# Patient Record
Sex: Female | Born: 1960 | Race: Black or African American | Hispanic: No | Smoking: Former smoker
Health system: Southern US, Community
[De-identification: ages and names within clinical notes are randomized; demographics above are authoritative.]

## PROBLEM LIST (undated history)

## (undated) DIAGNOSIS — K219 Gastro-esophageal reflux disease without esophagitis: Secondary | ICD-10-CM

## (undated) DIAGNOSIS — G629 Polyneuropathy, unspecified: Secondary | ICD-10-CM

## (undated) DIAGNOSIS — M1990 Unspecified osteoarthritis, unspecified site: Secondary | ICD-10-CM

## (undated) DIAGNOSIS — L509 Urticaria, unspecified: Secondary | ICD-10-CM

## (undated) DIAGNOSIS — Z8719 Personal history of other diseases of the digestive system: Secondary | ICD-10-CM

## (undated) DIAGNOSIS — F329 Major depressive disorder, single episode, unspecified: Secondary | ICD-10-CM

## (undated) DIAGNOSIS — F419 Anxiety disorder, unspecified: Secondary | ICD-10-CM

## (undated) DIAGNOSIS — J189 Pneumonia, unspecified organism: Secondary | ICD-10-CM

## (undated) DIAGNOSIS — J45909 Unspecified asthma, uncomplicated: Secondary | ICD-10-CM

## (undated) DIAGNOSIS — M5430 Sciatica, unspecified side: Secondary | ICD-10-CM

## (undated) DIAGNOSIS — R059 Cough, unspecified: Secondary | ICD-10-CM

## (undated) DIAGNOSIS — I2510 Atherosclerotic heart disease of native coronary artery without angina pectoris: Secondary | ICD-10-CM

## (undated) DIAGNOSIS — I219 Acute myocardial infarction, unspecified: Secondary | ICD-10-CM

## (undated) DIAGNOSIS — G4733 Obstructive sleep apnea (adult) (pediatric): Secondary | ICD-10-CM

## (undated) DIAGNOSIS — F32A Depression, unspecified: Secondary | ICD-10-CM

## (undated) DIAGNOSIS — R05 Cough: Secondary | ICD-10-CM

## (undated) HISTORY — DX: Personal history of other diseases of the digestive system: Z87.19

## (undated) HISTORY — DX: Cough: R05

## (undated) HISTORY — DX: Urticaria, unspecified: L50.9

## (undated) HISTORY — PX: PARTIAL COLECTOMY: SHX5273

## (undated) HISTORY — PX: NASAL RECONSTRUCTION: SHX2069

## (undated) HISTORY — PX: UVULOPALATOPHARYNGOPLASTY: SHX827

## (undated) HISTORY — PX: COLONOSCOPY: SHX174

## (undated) HISTORY — PX: TONSILLECTOMY AND ADENOIDECTOMY: SUR1326

## (undated) HISTORY — DX: Cough, unspecified: R05.9

## (undated) HISTORY — DX: Unspecified asthma, uncomplicated: J45.909

---

## 1999-06-19 ENCOUNTER — Emergency Department (HOSPITAL_COMMUNITY): Admission: EM | Admit: 1999-06-19 | Discharge: 1999-06-19 | Payer: Self-pay | Admitting: Emergency Medicine

## 2000-01-28 ENCOUNTER — Encounter: Payer: Self-pay | Admitting: Gastroenterology

## 2000-01-28 ENCOUNTER — Encounter: Admission: RE | Admit: 2000-01-28 | Discharge: 2000-01-28 | Payer: Self-pay | Admitting: Gastroenterology

## 2000-05-29 ENCOUNTER — Encounter: Admission: RE | Admit: 2000-05-29 | Discharge: 2000-05-29 | Payer: Self-pay | Admitting: Geriatric Medicine

## 2000-05-29 ENCOUNTER — Encounter: Payer: Self-pay | Admitting: Geriatric Medicine

## 2000-05-29 ENCOUNTER — Emergency Department (HOSPITAL_COMMUNITY): Admission: EM | Admit: 2000-05-29 | Discharge: 2000-05-29 | Payer: Self-pay | Admitting: Emergency Medicine

## 2000-06-02 ENCOUNTER — Encounter: Admission: RE | Admit: 2000-06-02 | Discharge: 2000-06-02 | Payer: Self-pay | Admitting: Geriatric Medicine

## 2000-06-02 ENCOUNTER — Encounter: Payer: Self-pay | Admitting: Geriatric Medicine

## 2000-06-16 ENCOUNTER — Encounter: Admission: RE | Admit: 2000-06-16 | Discharge: 2000-09-14 | Payer: Self-pay | Admitting: Geriatric Medicine

## 2000-09-16 ENCOUNTER — Encounter: Admission: RE | Admit: 2000-09-16 | Discharge: 2000-09-25 | Payer: Self-pay | Admitting: Geriatric Medicine

## 2000-10-10 ENCOUNTER — Emergency Department (HOSPITAL_COMMUNITY): Admission: EM | Admit: 2000-10-10 | Discharge: 2000-10-10 | Payer: Self-pay | Admitting: Emergency Medicine

## 2000-10-17 ENCOUNTER — Inpatient Hospital Stay (HOSPITAL_COMMUNITY): Admission: EM | Admit: 2000-10-17 | Discharge: 2000-10-19 | Payer: Self-pay | Admitting: Emergency Medicine

## 2000-11-15 ENCOUNTER — Encounter: Payer: Self-pay | Admitting: Emergency Medicine

## 2000-11-16 ENCOUNTER — Inpatient Hospital Stay (HOSPITAL_COMMUNITY): Admission: EM | Admit: 2000-11-16 | Discharge: 2000-11-25 | Payer: Self-pay | Admitting: Emergency Medicine

## 2000-11-21 ENCOUNTER — Encounter: Payer: Self-pay | Admitting: General Surgery

## 2000-11-23 ENCOUNTER — Encounter: Payer: Self-pay | Admitting: Geriatric Medicine

## 2000-12-01 ENCOUNTER — Inpatient Hospital Stay (HOSPITAL_COMMUNITY): Admission: EM | Admit: 2000-12-01 | Discharge: 2000-12-19 | Payer: Self-pay | Admitting: Emergency Medicine

## 2000-12-01 ENCOUNTER — Encounter (INDEPENDENT_AMBULATORY_CARE_PROVIDER_SITE_OTHER): Payer: Self-pay | Admitting: Specialist

## 2000-12-01 ENCOUNTER — Encounter: Payer: Self-pay | Admitting: Geriatric Medicine

## 2000-12-10 ENCOUNTER — Encounter: Payer: Self-pay | Admitting: Geriatric Medicine

## 2002-09-03 ENCOUNTER — Emergency Department (HOSPITAL_COMMUNITY): Admission: EM | Admit: 2002-09-03 | Discharge: 2002-09-03 | Payer: Self-pay | Admitting: Emergency Medicine

## 2003-06-14 ENCOUNTER — Encounter: Payer: Self-pay | Admitting: Geriatric Medicine

## 2003-06-14 ENCOUNTER — Encounter: Admission: RE | Admit: 2003-06-14 | Discharge: 2003-06-14 | Payer: Self-pay | Admitting: Geriatric Medicine

## 2003-07-19 ENCOUNTER — Ambulatory Visit (HOSPITAL_COMMUNITY): Admission: RE | Admit: 2003-07-19 | Discharge: 2003-07-19 | Payer: Self-pay | Admitting: Gastroenterology

## 2003-08-14 ENCOUNTER — Ambulatory Visit (HOSPITAL_BASED_OUTPATIENT_CLINIC_OR_DEPARTMENT_OTHER): Admission: RE | Admit: 2003-08-14 | Discharge: 2003-08-14 | Payer: Self-pay | Admitting: Otolaryngology

## 2003-10-11 ENCOUNTER — Encounter (INDEPENDENT_AMBULATORY_CARE_PROVIDER_SITE_OTHER): Payer: Self-pay | Admitting: *Deleted

## 2003-10-11 ENCOUNTER — Inpatient Hospital Stay (HOSPITAL_COMMUNITY): Admission: RE | Admit: 2003-10-11 | Discharge: 2003-10-12 | Payer: Self-pay | Admitting: Otolaryngology

## 2004-11-27 ENCOUNTER — Encounter: Admission: RE | Admit: 2004-11-27 | Discharge: 2004-11-27 | Payer: Self-pay | Admitting: Geriatric Medicine

## 2004-11-28 ENCOUNTER — Encounter: Admission: RE | Admit: 2004-11-28 | Discharge: 2004-11-28 | Payer: Self-pay | Admitting: Geriatric Medicine

## 2006-07-20 ENCOUNTER — Other Ambulatory Visit: Admission: RE | Admit: 2006-07-20 | Discharge: 2006-07-20 | Payer: Self-pay | Admitting: Geriatric Medicine

## 2006-08-27 ENCOUNTER — Emergency Department (HOSPITAL_COMMUNITY): Admission: EM | Admit: 2006-08-27 | Discharge: 2006-08-27 | Payer: Self-pay | Admitting: Emergency Medicine

## 2006-09-16 ENCOUNTER — Ambulatory Visit: Payer: Self-pay | Admitting: Family Medicine

## 2007-04-27 ENCOUNTER — Ambulatory Visit: Payer: Self-pay | Admitting: *Deleted

## 2007-04-27 ENCOUNTER — Ambulatory Visit: Payer: Self-pay | Admitting: Family Medicine

## 2007-04-28 ENCOUNTER — Ambulatory Visit (HOSPITAL_COMMUNITY): Admission: RE | Admit: 2007-04-28 | Discharge: 2007-04-28 | Payer: Self-pay | Admitting: Family Medicine

## 2007-05-06 ENCOUNTER — Encounter: Admission: RE | Admit: 2007-05-06 | Discharge: 2007-05-06 | Payer: Self-pay | Admitting: Family Medicine

## 2007-05-12 ENCOUNTER — Ambulatory Visit: Payer: Self-pay | Admitting: Family Medicine

## 2007-05-19 ENCOUNTER — Ambulatory Visit: Payer: Self-pay | Admitting: Family Medicine

## 2007-08-30 ENCOUNTER — Ambulatory Visit: Payer: Self-pay | Admitting: Family Medicine

## 2007-09-01 ENCOUNTER — Ambulatory Visit: Payer: Self-pay | Admitting: Family Medicine

## 2007-10-31 ENCOUNTER — Emergency Department (HOSPITAL_COMMUNITY): Admission: EM | Admit: 2007-10-31 | Discharge: 2007-10-31 | Payer: Self-pay | Admitting: Emergency Medicine

## 2008-01-10 ENCOUNTER — Ambulatory Visit: Payer: Self-pay | Admitting: Family Medicine

## 2008-03-06 ENCOUNTER — Ambulatory Visit: Payer: Self-pay | Admitting: Internal Medicine

## 2008-06-18 ENCOUNTER — Emergency Department (HOSPITAL_COMMUNITY): Admission: EM | Admit: 2008-06-18 | Discharge: 2008-06-18 | Payer: Self-pay | Admitting: Emergency Medicine

## 2008-07-09 ENCOUNTER — Emergency Department (HOSPITAL_COMMUNITY): Admission: EM | Admit: 2008-07-09 | Discharge: 2008-07-09 | Payer: Self-pay | Admitting: *Deleted

## 2008-07-31 ENCOUNTER — Ambulatory Visit: Payer: Self-pay | Admitting: Family Medicine

## 2008-11-06 ENCOUNTER — Ambulatory Visit: Payer: Self-pay | Admitting: Family Medicine

## 2008-11-13 ENCOUNTER — Ambulatory Visit: Payer: Self-pay | Admitting: Family Medicine

## 2008-11-14 ENCOUNTER — Ambulatory Visit: Payer: Self-pay | Admitting: Internal Medicine

## 2009-04-26 ENCOUNTER — Ambulatory Visit: Payer: Self-pay | Admitting: Internal Medicine

## 2009-05-14 ENCOUNTER — Ambulatory Visit: Payer: Self-pay | Admitting: Internal Medicine

## 2009-05-16 ENCOUNTER — Ambulatory Visit: Payer: Self-pay | Admitting: Internal Medicine

## 2009-06-26 ENCOUNTER — Ambulatory Visit: Payer: Self-pay | Admitting: Family Medicine

## 2009-06-26 ENCOUNTER — Encounter (INDEPENDENT_AMBULATORY_CARE_PROVIDER_SITE_OTHER): Payer: Self-pay | Admitting: Family Medicine

## 2009-06-26 ENCOUNTER — Telehealth (INDEPENDENT_AMBULATORY_CARE_PROVIDER_SITE_OTHER): Payer: Self-pay | Admitting: *Deleted

## 2009-06-26 LAB — CONVERTED CEMR LAB
ALT: 44 units/L — ABNORMAL HIGH (ref 0–35)
AST: 22 units/L (ref 0–37)
Basophils Absolute: 0 10*3/uL (ref 0.0–0.1)
Calcium: 9.4 mg/dL (ref 8.4–10.5)
Cholesterol: 221 mg/dL — ABNORMAL HIGH (ref 0–200)
Eosinophils Absolute: 0 10*3/uL (ref 0.0–0.7)
HDL: 59 mg/dL (ref >39)
Hemoglobin: 14.3 g/dL (ref 12.0–15.0)
Lymphocytes Relative: 27 % (ref 12–46)
Lymphs Abs: 1.7 10*3/uL (ref 0.7–4.0)
MCHC: 32.5 g/dL (ref 30.0–36.0)
Monocytes Relative: 11 % (ref 3–12)
Neutro Abs: 4 10*3/uL (ref 1.7–7.7)
Neutrophils Relative %: 61 % (ref 43–77)
Platelets: 232 10*3/uL (ref 150–400)
Total Bilirubin: 0.6 mg/dL (ref 0.3–1.2)
Total CHOL/HDL Ratio: 3.7
Total Protein: 7.3 g/dL (ref 6.0–8.3)
Triglycerides: 222 mg/dL — ABNORMAL HIGH (ref <150)

## 2009-07-03 ENCOUNTER — Telehealth (INDEPENDENT_AMBULATORY_CARE_PROVIDER_SITE_OTHER): Payer: Self-pay | Admitting: *Deleted

## 2009-08-27 DIAGNOSIS — I219 Acute myocardial infarction, unspecified: Secondary | ICD-10-CM

## 2009-08-27 HISTORY — DX: Acute myocardial infarction, unspecified: I21.9

## 2009-08-31 ENCOUNTER — Inpatient Hospital Stay (HOSPITAL_COMMUNITY): Admission: EM | Admit: 2009-08-31 | Discharge: 2009-09-06 | Payer: Self-pay | Admitting: Emergency Medicine

## 2009-09-03 ENCOUNTER — Encounter (INDEPENDENT_AMBULATORY_CARE_PROVIDER_SITE_OTHER): Payer: Self-pay | Admitting: Family Medicine

## 2009-09-10 ENCOUNTER — Encounter: Admission: RE | Admit: 2009-09-10 | Discharge: 2009-10-24 | Payer: Self-pay | Admitting: Family Medicine

## 2009-09-27 ENCOUNTER — Encounter (INDEPENDENT_AMBULATORY_CARE_PROVIDER_SITE_OTHER): Payer: Self-pay | Admitting: Internal Medicine

## 2009-09-27 ENCOUNTER — Ambulatory Visit: Payer: Self-pay | Admitting: Family Medicine

## 2009-12-10 ENCOUNTER — Ambulatory Visit: Payer: Self-pay | Admitting: Internal Medicine

## 2009-12-13 ENCOUNTER — Ambulatory Visit: Payer: Self-pay | Admitting: Family Medicine

## 2010-01-25 ENCOUNTER — Ambulatory Visit: Payer: Self-pay | Admitting: Family Medicine

## 2010-02-08 ENCOUNTER — Ambulatory Visit (HOSPITAL_COMMUNITY): Admission: RE | Admit: 2010-02-08 | Discharge: 2010-02-08 | Payer: Self-pay | Admitting: Family Medicine

## 2010-02-20 ENCOUNTER — Ambulatory Visit: Payer: Self-pay | Admitting: Family Medicine

## 2010-05-10 ENCOUNTER — Ambulatory Visit: Payer: Self-pay | Admitting: Internal Medicine

## 2010-12-30 ENCOUNTER — Inpatient Hospital Stay (INDEPENDENT_AMBULATORY_CARE_PROVIDER_SITE_OTHER)
Admission: RE | Admit: 2010-12-30 | Discharge: 2010-12-30 | Disposition: A | Discharge status: Home / Self Care | Payer: Self-pay | Source: Ambulatory Visit | Attending: Family Medicine | Admitting: Family Medicine

## 2010-12-30 DIAGNOSIS — J069 Acute upper respiratory infection, unspecified: Secondary | ICD-10-CM

## 2011-01-29 LAB — BASIC METABOLIC PANEL
BUN: 10 mg/dL (ref 6–23)
CO2: 32 mEq/L (ref 19–32)
GFR calc non Af Amer: >60 mL/min (ref >60)
Glucose, Bld: 167 mg/dL — ABNORMAL HIGH (ref 70–99)
Potassium: 5.6 mEq/L — ABNORMAL HIGH (ref 3.5–5.1)
Sodium: 138 mEq/L (ref 135–145)

## 2011-01-29 LAB — GLUCOSE, CAPILLARY
Glucose-Capillary: 149 mg/dL — ABNORMAL HIGH (ref 70–99)
Glucose-Capillary: 156 mg/dL — ABNORMAL HIGH (ref 70–99)
Glucose-Capillary: 163 mg/dL — ABNORMAL HIGH (ref 70–99)
Glucose-Capillary: 176 mg/dL — ABNORMAL HIGH (ref 70–99)
Glucose-Capillary: 176 mg/dL — ABNORMAL HIGH (ref 70–99)
Glucose-Capillary: 178 mg/dL — ABNORMAL HIGH (ref 70–99)
Glucose-Capillary: 186 mg/dL — ABNORMAL HIGH (ref 70–99)
Glucose-Capillary: 203 mg/dL — ABNORMAL HIGH (ref 70–99)
Glucose-Capillary: 233 mg/dL — ABNORMAL HIGH (ref 70–99)
Glucose-Capillary: 238 mg/dL — ABNORMAL HIGH (ref 70–99)
Glucose-Capillary: 248 mg/dL — ABNORMAL HIGH (ref 70–99)

## 2011-01-29 LAB — POCT CARDIAC MARKERS
CKMB, poc: <1 ng/mL — ABNORMAL LOW (ref 1.0–8.0)
CKMB, poc: <1 ng/mL — ABNORMAL LOW (ref 1.0–8.0)
Myoglobin, poc: 14.4 ng/mL (ref 12–200)
Myoglobin, poc: 72.7 ng/mL (ref 12–200)
Troponin i, poc: <0.05 ng/mL (ref 0.00–0.09)

## 2011-01-29 LAB — CK TOTAL AND CKMB (NOT AT ARMC)
CK, MB: 0.5 ng/mL (ref 0.3–4.0)
Relative Index: INVALID (ref 0.0–2.5)

## 2011-01-29 LAB — URINALYSIS, ROUTINE W REFLEX MICROSCOPIC
Glucose, UA: >1000 mg/dL — AB
Hgb urine dipstick: NEGATIVE
Ketones, ur: NEGATIVE mg/dL
Protein, ur: NEGATIVE mg/dL

## 2011-01-29 LAB — CARDIAC PANEL(CRET KIN+CKTOT+MB+TROPI)
CK, MB: 0.9 ng/mL (ref 0.3–4.0)
CK, MB: 0.9 ng/mL (ref 0.3–4.0)
CK, MB: 1 ng/mL (ref 0.3–4.0)
Relative Index: INVALID (ref 0.0–2.5)
Relative Index: INVALID (ref 0.0–2.5)
Total CK: 48 U/L (ref 7–177)
Total CK: 74 U/L (ref 7–177)
Troponin I: 0.03 ng/mL (ref 0.00–0.06)
Troponin I: <0.01 ng/mL (ref 0.00–0.06)

## 2011-01-29 LAB — CBC
HCT: 36.6 % (ref 36.0–46.0)
Hemoglobin: 12.8 g/dL (ref 12.0–15.0)
MCHC: 34.9 g/dL (ref 30.0–36.0)
MCV: 87.8 fL (ref 78.0–100.0)
Platelets: 220 10*3/uL (ref 150–400)
Platelets: 221 10*3/uL (ref 150–400)
RDW: 12.6 % (ref 11.5–15.5)
RDW: 13.2 % (ref 11.5–15.5)

## 2011-01-29 LAB — DIFFERENTIAL
Basophils Absolute: 0 10*3/uL (ref 0.0–0.1)
Basophils Absolute: 0 10*3/uL (ref 0.0–0.1)
Basophils Relative: 0 % (ref 0–1)
Basophils Relative: 0 % (ref 0–1)
Eosinophils Absolute: 0 10*3/uL (ref 0.0–0.7)
Eosinophils Absolute: 0.1 10*3/uL (ref 0.0–0.7)
Eosinophils Relative: 1 % (ref 0–5)
Eosinophils Relative: 1 % (ref 0–5)
Lymphocytes Relative: 21 % (ref 12–46)
Lymphocytes Relative: 26 % (ref 12–46)
Lymphs Abs: 1.5 10*3/uL (ref 0.7–4.0)
Monocytes Absolute: 0.7 10*3/uL (ref 0.1–1.0)
Monocytes Absolute: 0.9 10*3/uL (ref 0.1–1.0)
Monocytes Relative: 9 % (ref 3–12)
Neutro Abs: 5.1 10*3/uL (ref 1.7–7.7)
Neutrophils Relative %: 69 % (ref 43–77)

## 2011-01-29 LAB — TSH: TSH: 1.391 u[IU]/mL (ref 0.350–4.500)

## 2011-01-29 LAB — POCT I-STAT, CHEM 8
BUN: 9 mg/dL (ref 6–23)
Calcium, Ion: 1.07 mmol/L — ABNORMAL LOW (ref 1.12–1.32)
Chloride: 102 mEq/L (ref 96–112)
Creatinine, Ser: 0.8 mg/dL (ref 0.4–1.2)
Glucose, Bld: 165 mg/dL — ABNORMAL HIGH (ref 70–99)
HCT: 43 % (ref 36.0–46.0)
Hemoglobin: 14.6 g/dL (ref 12.0–15.0)
Potassium: 5 mEq/L (ref 3.5–5.1)
Sodium: 138 mEq/L (ref 135–145)
TCO2: 30 mmol/L (ref 0–100)

## 2011-01-29 LAB — HOMOCYSTEINE: Homocysteine: 8.4 umol/L (ref 4.0–15.4)

## 2011-01-29 LAB — URINE MICROSCOPIC-ADD ON

## 2011-01-29 LAB — LIPID PANEL
HDL: 59 mg/dL (ref >39)
Total CHOL/HDL Ratio: 3.2 RATIO
Triglycerides: 59 mg/dL (ref <150)

## 2011-01-29 LAB — PROTIME-INR: Prothrombin Time: 14 seconds (ref 11.6–15.2)

## 2011-01-29 LAB — TROPONIN I: Troponin I: 0.01 ng/mL (ref 0.00–0.06)

## 2011-01-29 LAB — HEMOGLOBIN A1C: Hgb A1c MFr Bld: 9.2 % — ABNORMAL HIGH (ref 4.6–6.1)

## 2011-01-29 LAB — D-DIMER, QUANTITATIVE: D-Dimer, Quant: <0.22 ug/mL-FEU (ref 0.00–0.48)

## 2011-03-08 ENCOUNTER — Emergency Department (HOSPITAL_COMMUNITY)
Admission: EM | Admit: 2011-03-08 | Discharge: 2011-03-08 | Disposition: A | Discharge status: Home / Self Care | Payer: Self-pay | Attending: Emergency Medicine | Admitting: Emergency Medicine

## 2011-03-08 DIAGNOSIS — E119 Type 2 diabetes mellitus without complications: Secondary | ICD-10-CM | POA: Insufficient documentation

## 2011-03-08 DIAGNOSIS — R197 Diarrhea, unspecified: Secondary | ICD-10-CM | POA: Insufficient documentation

## 2011-03-08 DIAGNOSIS — Z794 Long term (current) use of insulin: Secondary | ICD-10-CM | POA: Insufficient documentation

## 2011-03-08 LAB — URINALYSIS, ROUTINE W REFLEX MICROSCOPIC
Nitrite: NEGATIVE
Protein, ur: NEGATIVE mg/dL

## 2011-03-08 LAB — COMPREHENSIVE METABOLIC PANEL
ALT: 60 U/L — ABNORMAL HIGH (ref 0–35)
Alkaline Phosphatase: 97 U/L (ref 39–117)
BUN: 10 mg/dL (ref 6–23)
CO2: 27 mEq/L (ref 19–32)
Calcium: 9.4 mg/dL (ref 8.4–10.5)
GFR calc non Af Amer: 60 mL/min — ABNORMAL LOW (ref >60)
Glucose, Bld: 190 mg/dL — ABNORMAL HIGH (ref 70–99)
Potassium: 4.1 mEq/L (ref 3.5–5.1)
Sodium: 132 mEq/L — ABNORMAL LOW (ref 135–145)

## 2011-03-08 LAB — CBC
HCT: 48.3 % — ABNORMAL HIGH (ref 36.0–46.0)
Hemoglobin: 16.2 g/dL — ABNORMAL HIGH (ref 12.0–15.0)
MCHC: 33.5 g/dL (ref 30.0–36.0)
MCV: 85.6 fL (ref 78.0–100.0)
RDW: 12.3 % (ref 11.5–15.5)

## 2011-03-08 LAB — DIFFERENTIAL
Basophils Absolute: 0 10*3/uL (ref 0.0–0.1)
Eosinophils Relative: 0 % (ref 0–5)
Lymphocytes Relative: 13 % (ref 12–46)
Lymphs Abs: 0.6 10*3/uL — ABNORMAL LOW (ref 0.7–4.0)
Monocytes Absolute: 0.6 10*3/uL (ref 0.1–1.0)
Neutro Abs: 3.8 10*3/uL (ref 1.7–7.7)

## 2011-03-14 NOTE — Procedures (Signed)
[place]. [facility]  Patient:    Veronica [name], Veronica [name]                     MRN: [id number] Proc. Date: 12/10/00 Adm. Date:  [id number] Attending:  Ginette Otto                           Procedure Report  PROCEDURE:  Lumbar epidural catheter placement for postoperative pain control.  ANESTHESIOLOGIST:  Guadalupe Maple, M.D.  INDICATIONS:  [name] is a 50 year old black female with a history of severe chronic diverticulitis who underwent a subtotal colectomy by Dr. Jimmye Norman and Dr. Maisie Fus B. Price.  Dr. Lindie Spruce requested that I insert and epidural catheter for pain control.  After a discussion with him, we both agreed that it would be in her best interest.  DESCRIPTION OF PROCEDURE:  Upon completion of the procedure, while the patient was still under general endotracheal anesthesia, she was turned to the right lateral decubitus position.  The low back was prepped and draped sterilely. Two attempts were made at the L3-4 and L2-3 interspaces, without success, due to the inability to pass the #18 gauge Tuohy needle between the bony laminae. The L4-5 interspace was entered in the midline using a #18 gauge Tuohy needle. One pass of the needle was required to enter the epidural space using the loss of resistance technique with air.  Aspiration of the needle was negative for blood or CSF.  Then 10 cc of preservative-free saline to which was added 100 mcg of fentanyl was injected through the needle without difficulty.  The catheter was inserted 4.0 cm into the epidural space, and the needle was removed without difficulty.  The catheter was then taped securely in place. The patient was subsequently turned to the supine position, extubated, and brought to the recovery room in stable condition.  She will then be begun on an epinephrine fentanyl and Marcaine infusion and followed on a daily basis by the anesthesia service. DD:  12/10/00 TD:   12/10/00 Job: [id number]

## 2011-03-14 NOTE — Discharge Summary (Signed)
[place]. [facility]  Patient:    Veronica [name], Veronica [name]                     MRN: [id number] Adm. Date:  [id number] Disc. Date: [id number] Attending:  Cherylynn Ridges                           Discharge Summary  DISCHARGE DIAGNOSIS:  Severe recurrent acute diverticulitis.  PRINCIPAL PROCEDURES:  Subtotal colectomy by Jimmye Norman, M.D.  That was done on December 10, 2000.  DISCHARGE MEDICATIONS:  She was sent home on Lortab elixir to take as directed.  FOLLOW-UP:  She was to follow up to see me on December 22, 2000.  BRIEF SUMMARY OF HOSPITAL COURSE:  The patient was by the medical service with recurrent attacks of severe diverticulitis.  After some discussion with this being the patients second or third attack within the last several weeks, she elected to have surgery.  She did undergo a bowel prep and subsequently underwent a subtotal colectomy because of the involvement of the entire colon up to and past the splenic flexure.  We could not bring the proximal transverse colon down to the pelvis.  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  The patient did well postoperatively, although she did have significant pain.  No wound infection was developed.  She had the bowel movement prior to discharge.  She was to follow up to see me as an outpatient. D:  02/06/01 TD:  02/06/01 Job: [id number]

## 2011-03-14 NOTE — Op Note (Signed)
   NAME:  Veronica [name], Veronica [name]                        ACCOUNT NO.:  [id number]   MEDICAL RECORD NO.:  [id number]                   PATIENT TYPE:  AMB   LOCATION:  ENDO                                 FACILITY:  [facility]   PHYSICIAN:  Petra Kuba, M.D.                 DATE OF BIRTH:  1961-08-08   DATE OF PROCEDURE:  07/19/2003  DATE OF DISCHARGE:                                 OPERATIVE REPORT   PROCEDURE PERFORMED:  Esophagogastroduodenoscopy.   ENDOSCOPIST:  Petra Kuba, M.D.   INDICATIONS FOR PROCEDURE:  Atypical reflux, want to confirm hiatal hernia.   Consent was signed after the risks, benefits, methods and options were  thoroughly discussed multiple times in the past.   MEDICINES USED:  Demerol 50 mg, Versed 7.5 mg.   DESCRIPTION OF PROCEDURE:  The video endoscope was inserted by direct  vision.  The esophagus was normal.  In the distal esophagus was a small  hiatal hernia.  There were no signs of esophagitis or Barrett's.  Scope  passed into the stomach, advanced through a normal antrum, normal pylorus  into a normal duodenal bulb and around the C-loop to a normal second portion  of the duodenum.  The scope was withdrawn back to the bulb and a good look  there ruled out ulcer in that location.  Scope was withdrawn back to the  stomach which was evaluated on retroflex and straight visualization with a  good look at the angularis, cardia and fundus, lesser and greater curve  without additional findings.  The small hiatal hernia was confirmed in the  cardia.  Straight visualization of the stomach did not reveal any additional  findings.  Air was suctioned, scope was slowly withdrawn.  Again, a good  look at the esophagus was normal.  Scope was removed.  The patient tolerated  the procedure well.  There were no obvious immediate complication.   ENDOSCOPIC DIAGNOSIS:  1. Small hiatal hernia.  2. Otherwise normal esophagogastroduodenoscopy.   PLAN:  Would change pump  inhibitors, possible add Reglan, consider 24-hour  pH either on the medicines or off the medicines.  Happy to see back p.r.n.  or in six to eight weeks to recheck symptoms and decide any other work-up  and plans.  If we do not think this is due to reflux, then ENT or pulmonary  consult would be indicated.                                               Petra Kuba, M.D.   MEM/MEDQ  D:  07/19/2003  T:  07/20/2003  Job:  [id number]   cc:   Hal T. Stoneking, M.D.  [address] [place], [place] [address]  Fax: [phone]

## 2011-03-14 NOTE — Op Note (Signed)
NAME:  Veronica, [name]                        ACCOUNT NO.:  [id number]   MEDICAL RECORD NO.:  [id number]                   PATIENT TYPE:  OIB   LOCATION:  2114                                 FACILITY:  [facility]   PHYSICIAN:  Kristine Garbe. Ezzard Standing, M.D.         DATE OF BIRTH:  Apr 01, 1961   DATE OF PROCEDURE:  10/11/2003  DATE OF DISCHARGE:                                 OPERATIVE REPORT   PREOPERATIVE DIAGNOSIS:  Obstructive sleep apnea with tonsillar hypertrophy,  turbinate hypertrophy with nasal obstruction.   POSTOPERATIVE DIAGNOSIS:  Obstructive sleep apnea with tonsillar  hypertrophy, turbinate hypertrophy with nasal obstruction.   OPERATION:  Bilateral inferior turbinate reductions with submucosal  resection of bone.  Uvulopalatal pharyngoplasty with tonsillectomy.   SURGEON:  Kristine Garbe. Ezzard Standing, M.D.   ANESTHESIA:  General endotracheal anesthesia.   COMPLICATIONS:  None.   BRIEF CLINICAL NOTE:  Veronica [name] is a 50 year old female who has had a  sleep test which demonstrated obstructive sleep apnea with an RDI of 15 and  O2 saturations dropping down to 68% range.  She has intermittent nasal  obstruction with enlarged turbinates on exam with only minimal septal  deformity to the left.  She is taken to the operating room at this time for  turbinate reduction to improve her nasal airway and uvulopalatal  pharyngoplasty with tonsillectomy to improve her obstructive sleep apnea.   DESCRIPTION OF PROCEDURE:  After adequate endotracheal anesthesia, the  patient received 1 gram Ancef IV preoperatively as well as 8 mg Decadron IV  preoperatively.  Her nose was examined first.  Isabeau had large, swollen  turbinates as well as swollen nasal mucosa throughout.  The turbinates were  injected with Xylocaine with epinephrine. An incision was made along the  inferior edge of the turbinates bilaterally.  Submucosal flaps were elevated  off the turbinate bone and then the turbinate  bone was removed from both  inferior turbinates.  Submucosal suction cautery was performed bilaterally  and the remaining turbinate tissue was outfractured.  There was minimal  bleeding.  The remaining nasal passageway was clear.  There was a small  septal deformity to the left which does not appear to be causing significant  obstruction.   Following this, a mouth gag was used to expose the oropharynx.  The patient  had a long soft palate and uvula as well as  moderately large 2+ tonsils  bilaterally.  The tonsils were dissected from the tonsillar fossae  bilaterally.  Hemostasis was obtained with the cautery.  After resecting the  tonsils, the uvula was transected approximately 0.5 cm proximal to its base  and the distal 0.5 cm of soft palate was resected.  This completed the  procedure.  After obtaining hemostasis with the cautery, the tonsillar  fossae were reapproximated with 4-0 Vicryl sutures bilaterally and then a  running 4- chromic suture was used to reapproximate the mucosal edges of the  soft palate.  A  portion of the posterior tonsillar pillar which was  redundant and enlarged was removed bilaterally, also.  This completed the  procedure.  Aizza was awakened from anesthesia and transferred to the  recovery room postoperatively doing well.   DISPOSITION:  Colleena will be observed overnight in the intensive care unit  on O2 saturation monitor.  I will plan on discharge in the morning on  Amoxicillin suspension 400 mg b.i.d. for ten days and Tylenol or Lortab  Elixir 15 mL q.4h. p.r.n. pain.  Infinity will follow up in my office in 2-3  weeks for recheck.                                               Kristine Garbe. Ezzard Standing, M.D.    CEN/MEDQ  D:  10/11/2003  T:  10/11/2003  Job:  [id number]   cc:   Hal T. Stoneking, M.D.  [address] [place], [place] [address]  Fax: [phone]

## 2011-03-14 NOTE — Discharge Summary (Signed)
[facility]  Patient:    Veronica [name], HEMMER                     MRN: [id number] Adm. Date:  [id number] Disc. Date: 11/25/00 Attending:  Eliezer Champagne CC:         Petra Kuba, M.D.  Timothy E. Earlene Plater, M.D.   Discharge Summary  ADMITTING DIAGNOSES:          Diverticulitis.  DISCHARGE DIAGNOSES:          1. Diverticulitis.                               2. Obesity.                               3. Chronic low back pain.  CONSULTANTS:                  Dr. Jorja Loa Davis-surgery, Dr. Loraine Leriche Magod-GI.  HISTORY:                      Ms. Heng is a very nice 50 year old black female who has had a long history of diverticulitis over the last nine two 10 years.  She had an episode of diverticulitis requiring hospitalization in December 2001.  She seemed to get better but two days prior to this admission she developed left lower quadrant abdominal pain, nausea, vomiting, constipation.  PHYSICAL EXAMINATION:  VITAL SIGNS:                  Blood pressure 161/81, pulse 107, temperature 100.7, respiratory rate 28.  HEENT:                        Unremarkable.  HEART:                        Regular rate and rhythm without murmurs, rubs, or gallops.  LUNGS:                        Clear.  ABDOMEN:                      Soft.  Active bowel sounds.  Tender in the left lower quadrant.  No rebound.  LABORATORIES:                 CT scan showed acute sigmoid diverticulitis with no abscess.                                White count 13,600, hemoglobin 13.7, platelet count 272,000, 83% neutrophils.  Sodium 135, potassium 4.0, BUN 6, creatinine 0.8, CO2 31, chloride 100, glucose 129, calcium 8.8, total protein 7.6, albumin 3.9, AST 19, ALT 24, ALP 47, total bilirubin 1.1.  HOSPITAL COURSE:              Patient was admitted to regular hospital bed. She was placed on IV Zosyn, made n.p.o.  She was seen in consultation by Dr. Kendrick Ranch who agreed she may very well need  surgery because of her recurrent symptoms.  He gave her GoLYTELY, clear liquid diet, and gradually cleaned out her bowels.  Then three days prior to discharge  she had a Gastrografin barium enema.  The left colon showed moderate diverticular disease, few scattered diverticula elsewhere, some postinflammatory changes proximal descending colon.  No definite changes of diverticulitis.  She was seen in consultation by Dr. Leary Roca as well and both Dr. Earlene Plater and Dr. Ewing Schlein felt she would need a colonoscopy as an outpatient after another week of antibiotics.  He also noted that she has had some trouble swallowing and suggested that she start Protonix and Nystatin swish and swallow.  At the time of discharge she is afebrile.  Her abdomen was still tender.  She had completed nine days of Zosyn.  DISPOSITION:                  Patient is discharged home in improved condition.  DISCHARGE MEDICATIONS:        1. Tequin 400 mg q.d. for an additional seven                                  days.                               2. Nystatin one teaspoon swish and swallow                                  before meals and at bedtime.                               3. Protonix 40 mg q.d.  DIET:                         Clear liquid diet.  Try to achieve weight loss.  FOLLOW-UP:                    She is to call Dr. Heide Spark office for an appointment in one week for a possibility of setting up a colonoscopy and then follow-up with Dr. Earlene Plater after that.  She will see me back in the office in the near future after they have resolved these issues. DD:  11/25/00 TD:  11/25/00 Job: [id number]

## 2011-03-14 NOTE — H&P (Signed)
[facility]  Patient:    Veronica [name], Veronica [name]                     MRN: [id number] Adm. Date:  [id number] Attending:  Ginette Otto                         History and Physical  DATE OF BIRTH:  [birth year]  CHIEF COMPLAINT:  Abdominal pain.  HISTORY OF PRESENT ILLNESS:  The patient is a 50 year old black female with a history of chronic back pain who presents with a one-week history of diverticulitis.  She was seen in the [facility] Emergency Department about a week ago, and was given Cipro as well as Vicodin at that time.  She was toleratED to follow up with her regular doctor if her pain continued.  She continued her medications routinely and was seen by Lavinia Sharps in our office on December 18.  At that time, Flagyl 250 mg t.i.d. was added to her regimen.  The patient reports that she has had almost no bowel movement in one weeks time.  She continued with her Cipro, Flagyl and Vicodin for pain routinely until today.  She started developing some nausea and just was not able to keep anything down this morning, although she has had no emesis in the six hours that she has been in the emergency department.  She said that she felt significantly worse and came to the emergency department to be evaluated because of the pain, primarily.  REVIEW OF SYSTEMS:  She denies chest pain or palpitations, shortness of breath, chronic cough or orthopnea.  No urinary symptoms.  No diarrhea.  No blood in the stool.  No musculoskeletal symptoms.  No skin rashes of note.  No fevers, chills or seats.  No change in vision or hearing.  PAST MEDICAL HISTORY: 1. Diverticulitis. 2. Hiatal hernia. 3. History of recurrent constipation. 4. History of back pain with radiculopathy for the past several years.  MEDICATIONS:  Vicodin p.r.n.  ALLERGIES:  None.  SOCIAL HISTORY:  The patient is married.  She has four children.  She stopped smoking in 1989.  She does not  consume any alcohol.  FAMILY HISTORY:  Her father is alive and in good health.  Her mother is 74. He has obesity, hypertension and diabetes.  She has a brother who has hypertension.  She has four children in good health.  PHYSICAL EXAMINATION:  VITAL SIGNS:  Blood pressure 124/74, pulse 80, respiratory rate 20, temperature 97.8.  HEENT:  Tympanic membranes are normal.  Pupils equal, round and reactive to light.  Extraocular movements intact.  Nasal mucosa and oral mucosa within normal limits.  NECK:  Supple.  No adenopathy.  No JVD.  No bruits.  LUNGS:  Clear to auscultation bilaterally.  HEART:  Regular rate and rhythm without murmurs, rubs, or gallops.  ABDOMEN:  Soft and nondistended.  She is tender throughout, particularly in the left lower quadrant.  EXTREMITIES:  No clubbing, cyanosis, or edema.  SKIN:  Intact with no significant lesions noted.  NEUROLOGIC:  Cranial nerves II-XII intact.  She is alert and oriented.  Muscle strength 5/5.  Reflexes 2+ and equal.  LABORATORY DATA:   White count elevated at 14.7 with 83 neutrophils. Hemoglobin stable at 13.6.  BMET is normal.  LFTs also within normal limits.  CT of the abdomen shows diverticulitis without abscess.  Large amount os tool in  her colon.  ASSESSMENT AND PLAN: 1. Abdominal pain as a primary complaint.  The patient is constipated, most likely from the pain medications she has been taking over the last week.  Will have her hold all narcotics at present.  Will give her Colace to see if we can loosen her stool.  I will not give her any laxative at present because of the acute diverticulitis.  2. Diverticulitis.  Continue with Cipro and Flagyl, and will do that IV for now.  3. Pain management.  Will avoid narcotics and will give Toradol for now. DD:  10/17/00 TD:  10/18/00 Job: [id number]

## 2011-03-14 NOTE — Consult Note (Signed)
[facility]  Patient:    Veronica [name], Veronica [name]                     MRN: [id number] Proc. Date: 11/16/00 Adm. Date:  [id number] Attending:  Eliezer Champagne CC:         Everardo All. Madilyn Fireman, M.D.                          Consultation Report  REASON FOR CONSULTATION:  I was asked in consultation Dr. Dorena Cookey to see this 50 year old black female. She was admitted yesterday with recurrent acute diverticulitis. She had been ill for approximately three days prior to admission with increasing abdominal pain, constipation and question of low grade fever. Importantly she had been admitted in December 2001 with acute diverticulitis.  I have interviewed the patient, reviewed the chart, reviewed the x-rays and examined the patient.  She gives a history of at least five years of laxative dependent constipation. If she did not take a laxative, she did not have bowel action and even here lately if she did take a laxative she did not always have a bowel movement. She also reports that she has been constipated most of her memorable life. She has always been obese, now weighs 270 pounds. Her greatest weight was 290 pounds and once on a weight watchers diet, she got down to 153. She relates the higher weight with constipation. She has been counseled on food intake, types of food, weight loss. She has been given a variety of laxatives and counseling in regard to constipation. She reports approximately 20 episodes of diverticulitis in the last five years only two of which that has required hospitalization. There is a report in the chart of a CT scan at [facility] in 1996 showing acute diverticulitis.  Most notably, the CT scan of December 2001 shows clearly diverticulitis of the proximal descending colon and clearly the CT scan at present shows acute diverticulitis of the sigmoid colon. There are distinctly two areas of involvement with the recent acute diverticulitis.  The patient  says she takes no real medications or drugs at this time. She was treated August through November of 2001, she was out of work with a back problem and at that time was taking Vicodin, Flexeril and Vioxx. She does not take those medications at present.  Her only pill intake at this time is Colace as a stool softener and Milk of Magnesia as a laxative.  She reports no drug allergies. She reports a skin allergy to ingested coconut.  She reports no prior surgical procedures.  She has regular menses, the last being the first week of January. She uses no method of birth control and reports on sexual encounters on a regular basis.  PHYSICAL EXAMINATION:  GENERAL:  Shows a woman of stated age in no acute distress. She is morbidly obese.  CHEST:  Clear.  HEART:  Sounds are regular.  ABDOMEN:  Grossly obese with large rolls of subcutaneous fat. There is an area of palpable tenderness in the left mid abdomen and flank and a separate area of tenderness in the left groin area of the abdomen. The exam is considered not entirely reliable due to the thickness of the abdominal wall.  COMMENTS:  I have reviewed her previous and current x-rays and concur.  I would agree with her current treatment that being bedrest of room activities only and p.o. medications only and IV antibiotics.  She agrees and understands that her weight is massive and that her weight is a detriment to recovery particularly if surgery is involved. She does understand that without a clean and absolute resolution of this current clinical syndrome that surgery will need to be done emergently; however, it is possible that the surgery could be delayed. She was satisfied with the consultation and agreed with the current recommendations. DD:  11/16/00 TD:  11/16/00 Job: [id number]

## 2011-03-14 NOTE — Discharge Summary (Signed)
[facility]  Patient:    Veronica [name], Veronica [name]                     MRN: [id number] Adm. Date:  [id number] Disc. Date: 10/19/00 Attending:  Ginette Otto                           Discharge Summary  ADMITTING DIAGNOSIS:          Diverticulitis.  DISCHARGE DIAGNOSES:          1. Diverticulitis.                               2. Constipation.                               3. Hiatal hernia.                               4. Past history of back pain.                               5. Obesity.  HISTORY OF PRESENT ILLNESS:   Ms. Cruzen is a very nice, 50 year old black female who has had past history of chronic back pain. She had been treated as an outpatient with diverticulitis. She had been on Cipro and was placed on Vicodin for pain. She was seen in the office on December 18 and Flagyl was added to her regimen. She reported to the emergency room on October 17, 2000 with decreased bowel movements for a week and increasing lower abdominal pain, nausea but no vomiting.  PHYSICAL EXAMINATION:  VITAL SIGNS:                  Blood pressure 124.74, temperature 97.8.  HEENT:                        Unremarkable.  LUNGS:                        Clear.  HEART:                        Regular rate and rhythm without murmurs, rubs, or gallops.  ABDOMEN:                      Soft, tender throughout.  LABORATORY DATA ON ADMISSION:                    White count 14,700, hemoglobin 13.6. Sodium 138, potassium 4.6, chloride 108, bicarb 28, BUN 5, creatinine 0.8, glucose 116.  CT scan of the abdomen revealed diverticulitis without abscess, large amount of stool in the colon.  HOSPITAL COURSE:              Patient was admitted to regular hospital bed and was started on IV fluids, IV Cipro and Flagyl. She was given clear liquid diet. The first evening of admission she did have results with a bowel movement. The next day she felt much better. She was able to  eat and again, had a small soft bowel movement.  PLAN:  The patient is discharged home in an improved condition. Of note, a white count came down to 10,400. She remained afebrile. MEDICATIONS AT DISCHARGE:     1. Cipro 250 mg b.i.d. for another two to three                                 days.                               2. Flagyl 250 mg three times a day for another                                  two to three days.  DISCHARGE INSTRUCTIONS:       She is to call the office if she is still having problems in another two to three days to extend the antibiotic treatment.  FOLLOWUP:                     She will be seen back in the office in three to four weeks.DD:  10/19/00 TD:  10/19/00 Job: [id number]

## 2011-03-14 NOTE — H&P (Signed)
[place]  Patient:    Veronica [name], Veronica [name]                     MRN: [id number] Adm. Date:  [id number] Attending:  Doug Sou                         History and Physical  IDENTIFYING DATA:  Veronica [name] is a 50 year old black female.  She has a prior history of diverticulosis and recurrent episodes of diverticulitis.  She had recurrent problems with diverticulitis this last year resulting in an admission to [facility], October 17, 2000.  During that hospitalization, she had a CT scan remarkable for diverticulitis.  She was treated with IV antibiotics, improved to some extent and then had a bowel prep and was seen by general surgery, Dr. Lorelee New.  Gastrografin barium enema was done which surprisingly showed very little in the way of diverticulitis. It was felt that surgery would be held off at least until a colonoscopy could be done as an outpatient.  She was discharged on Zosyn and since then had done well for three to four days but then about two days ago developed a fever again of 102, increasing left upper and left lower quadrant pain and nausea and vomiting over the last two days.  She has continued to take the Tequin 400 mg a day.  ALLERGIES:  No known drug allergies.  CURRENT MEDICATIONS:  Tequin 400 mg a day.  Levbid p.r.n.  Nystatin swish and spit t.i.d.  Protonix 40 mg a day.  PAST MEDICAL HISTORY:  Remarkable for diverticulosis/diverticulitis, hiatal hernia, obesity, recurrent constipation.  Past history of radiculopathy.  SOCIAL HISTORY:  The patient is married. She has four children.  She stopped smoking in 1989.  She does not consume alcohol.  PAST SURGICAL HISTORY:  None.  FAMILY HISTORY:  Father in good health.  Mother 17 suffers from obesity, hypertension and diabetes.  Brother with hypertension.  Four children in good health.  REVIEW OF SYSTEMS:  Denies any headache.  Has occasionally had shaking  chills. No change in vision or hearing.  No shortness of breath.  Does complain of a cough productive of whitish sputum.  GI:  Please see HPI.  PHYSICAL EXAMINATION:  VITAL SIGNS:  Blood pressure systolic 88, pulse 100, temperature 99.5.  SKIN:  Warm and dry.  HEENT:  Oropharynx remarkable for a white coating on her tongue.  Pupils equal, round and reactive to light.  Disks sharp.  TMs normal.  LUNGS:  Clear.  HEART:  Regular rate and rhythm without murmurs, rubs or gallops.  ABDOMEN:  Revealed active bowel sounds.  She is diffusely tender but worse in the left upper quadrant and left lower quadrant.  No rebound or rigidity.  RECTAL:  Deferred as this was done multiple times during her recent hospitalization.  LABORATORY DATA AVAILABLE AT THIS TIME:  White count elevated at 18,700. Hemoglobin 13.8.  ASSESSMENT: 1. Recurrent diverticulitis failing medical therapy, multiple attempts. 2. Thrush. 3. Dehydration.  PLAN:  Will admit.  Give IV fluids.  IV Zosyn.  Will obtain GI and surgical consultation. DD:  12/01/00 TD:  12/02/00 Job: [id number]

## 2011-03-14 NOTE — H&P (Signed)
[facility]  Patient:    Veronica [name], Veronica [name]                     MRN: [id number] Adm. Date:  [id number] Attending:  Eliezer Champagne CC:         Petra Kuba, M.D.  Hal T. Stoneking, M.D.   History and Physical  CHIEF COMPLAINT:  Left lower quadrant abdominal pain, probable diverticulitis.  HISTORY OF PRESENT ILLNESS:  This is a 50 year old black female with a history of diverticulitis with several episodes, last admitted around Christmas of 2001 for this.  The patient was given IV Cipro and Flagyl at that time.  She recently took p.o. Cipro and Flagyl as an outpatient for this, but this has been no help.  For the last two days, he has had extreme left lower quadrant abdominal pain, vomiting and constipation.  No hematemesis, melena or bright red blood per rectum.  REVIEW OF SYSTEMS:  Otherwise negative for chest pain, shortness of breath, urinary complaints.  Positive fever and chills.  PAST MEDICAL HISTORY: 1. Diverticulitis. 2. Insomnia. 3. 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  MEDICATIONS: 1. Cipro. 2. Flagyl. 3. Ambien.  ALLERGIES:  No known drug allergies.  PAST SURGICAL HISTORY:  Negative.  GASTROENTEROLOGIST:  Petra Kuba, M.D.  LAST COLONOSCOPY:  About 1996 for diverticulitis.  SOCIAL HISTORY:  She denies tobacco, alcohol or drugs.  She is married and has step children.  PHYSICAL EXAMINATION:  GENERAL:  The patient is more comfortable after she has been given Dilaudid.  VITAL SIGNS:  (Initially) Blood pressure 161/81, pulse 107 and regular, respiratory rate 28, temperature 100.7.  HEENT:  Normocephalic and atraumatic.  Funduscopic exam is benign.  Pupils equal, round and reactive to light and accommodation.  Extraocular movements intact.  Oropharynx is clear.  NECK:  Supple without cervical adenopathy.  No thyromegaly.  No JVD.  No carotid bruits.  HEART:  Regular rate and rhythm.  Normal S1 and S2 without murmurs, rubs,  or gallops.  LUNGS:  Clear to auscultation bilaterally.  ABDOMEN:  Soft with positive bowel sounds.  Positive left lower quadrant tenderness with voluntary guarding.  No rebound.  The right side is tender, as well.  EXTREMITIES:  No clubbing, cyanosis, or edema.  LABORATORY DATA:  CT scan shows acute sigmoid diverticulitis with no abscess.  White count 13.6, H&H 13.7 and 142.4, platelet count 272, 83% PNs.  CMET shows sodium 135, potassium 4.0, BUN 6, creatinine 0.8, CO2 31, chloride 100, glucose 129.  LFTs are normal.  ASSESSMENT AND PLAN:  Probable acute diverticulitis status post Cipro and Flagyl.  We will admit and try IV Zosyn, IV fluids, clear liquids and pain medications.  Will give Dilaudid and p.r.n. Phenergan.  The patient likely needs GI consult with Dr. Ewing Schlein and probably surgical consult, as well. DD:  11/16/00 TD:  11/16/00 Job: [id number]

## 2011-12-09 ENCOUNTER — Other Ambulatory Visit: Payer: Self-pay

## 2011-12-09 ENCOUNTER — Encounter (HOSPITAL_COMMUNITY): Payer: Self-pay | Admitting: *Deleted

## 2011-12-09 ENCOUNTER — Emergency Department (HOSPITAL_COMMUNITY)
Admission: EM | Admit: 2011-12-09 | Discharge: 2011-12-10 | Disposition: A | Discharge status: Home / Self Care | Payer: Self-pay | Attending: Emergency Medicine | Admitting: Emergency Medicine

## 2011-12-09 DIAGNOSIS — IMO0002 Reserved for concepts with insufficient information to code with codable children: Secondary | ICD-10-CM | POA: Insufficient documentation

## 2011-12-09 DIAGNOSIS — Z794 Long term (current) use of insulin: Secondary | ICD-10-CM | POA: Insufficient documentation

## 2011-12-09 DIAGNOSIS — S139XXA Sprain of joints and ligaments of unspecified parts of neck, initial encounter: Secondary | ICD-10-CM | POA: Insufficient documentation

## 2011-12-09 DIAGNOSIS — S161XXA Strain of muscle, fascia and tendon at neck level, initial encounter: Secondary | ICD-10-CM

## 2011-12-09 DIAGNOSIS — S8390XA Sprain of unspecified site of unspecified knee, initial encounter: Secondary | ICD-10-CM

## 2011-12-09 DIAGNOSIS — S39012A Strain of muscle, fascia and tendon of lower back, initial encounter: Secondary | ICD-10-CM

## 2011-12-09 DIAGNOSIS — S339XXA Sprain of unspecified parts of lumbar spine and pelvis, initial encounter: Secondary | ICD-10-CM | POA: Insufficient documentation

## 2011-12-09 DIAGNOSIS — E119 Type 2 diabetes mellitus without complications: Secondary | ICD-10-CM | POA: Insufficient documentation

## 2011-12-09 DIAGNOSIS — W010XXA Fall on same level from slipping, tripping and stumbling without subsequent striking against object, initial encounter: Secondary | ICD-10-CM | POA: Insufficient documentation

## 2011-12-09 DIAGNOSIS — R000 Tachycardia, unspecified: Secondary | ICD-10-CM | POA: Insufficient documentation

## 2011-12-09 DIAGNOSIS — R509 Fever, unspecified: Secondary | ICD-10-CM | POA: Insufficient documentation

## 2011-12-09 DIAGNOSIS — S73109A Unspecified sprain of unspecified hip, initial encounter: Secondary | ICD-10-CM

## 2011-12-09 DIAGNOSIS — Y9229 Other specified public building as the place of occurrence of the external cause: Secondary | ICD-10-CM | POA: Insufficient documentation

## 2011-12-09 DIAGNOSIS — W19XXXA Unspecified fall, initial encounter: Secondary | ICD-10-CM

## 2011-12-09 LAB — GLUCOSE, CAPILLARY: Glucose-Capillary: 187 mg/dL — ABNORMAL HIGH (ref 70–99)

## 2011-12-09 NOTE — ED Notes (Addendum)
Pt in s/p fall, c/o left knee pain and left leg pain, also numbness in bilateral arms, pt does not remember fall and is unsure why she fell

## 2011-12-09 NOTE — ED Notes (Signed)
Patient came to ER from home after being at [facility] earlier today for her "checkup" when she tripped and fell on her left side at approx. 1700. Denies LOC or hitting head. States she felt fine immediately following and went home. At around approx. 2000, pt reports getting the chills and felt like her whole left side was numb. Patient able to feel touch but states it feels like pins and needles. Patient also reports RUE numbness/tingling x 1 month. Denies N/V/CP/SOB at this time.

## 2011-12-10 ENCOUNTER — Emergency Department (HOSPITAL_COMMUNITY): Payer: Self-pay

## 2011-12-10 LAB — CBC
HCT: 36.8 % (ref 36.0–46.0)
Hemoglobin: 12.3 g/dL (ref 12.0–15.0)
MCH: 29.2 pg (ref 26.0–34.0)
MCHC: 33.4 g/dL (ref 30.0–36.0)
MCV: 87.4 fL (ref 78.0–100.0)
Platelets: 217 10*3/uL (ref 150–400)
RBC: 4.21 MIL/uL (ref 3.87–5.11)
RDW: 12.5 % (ref 11.5–15.5)
WBC: 11.3 10*3/uL — ABNORMAL HIGH (ref 4.0–10.5)

## 2011-12-10 LAB — URINALYSIS, ROUTINE W REFLEX MICROSCOPIC
Bilirubin Urine: NEGATIVE
Glucose, UA: 100 mg/dL — AB
Hgb urine dipstick: NEGATIVE
Ketones, ur: NEGATIVE mg/dL
Leukocytes, UA: NEGATIVE
Nitrite: NEGATIVE
Protein, ur: NEGATIVE mg/dL
Specific Gravity, Urine: 1.022 (ref 1.005–1.030)
Urobilinogen, UA: 0.2 mg/dL (ref 0.0–1.0)
pH: 5.5 (ref 5.0–8.0)

## 2011-12-10 LAB — POCT I-STAT, CHEM 8
Chloride: 100 mEq/L (ref 96–112)
Glucose, Bld: 199 mg/dL — ABNORMAL HIGH (ref 70–99)
HCT: 42 % (ref 36.0–46.0)
Potassium: 4.3 mEq/L (ref 3.5–5.1)
Sodium: 136 mEq/L (ref 135–145)

## 2011-12-10 LAB — BASIC METABOLIC PANEL
BUN: 11 mg/dL (ref 6–23)
CO2: 27 mEq/L (ref 19–32)
Calcium: 8.6 mg/dL (ref 8.4–10.5)
Chloride: 101 mEq/L (ref 96–112)
Creatinine, Ser: 0.77 mg/dL (ref 0.50–1.10)
GFR calc Af Amer: >90 mL/min (ref >90)
GFR calc non Af Amer: >90 mL/min (ref >90)
Glucose, Bld: 177 mg/dL — ABNORMAL HIGH (ref 70–99)
Potassium: 4 mEq/L (ref 3.5–5.1)
Sodium: 136 mEq/L (ref 135–145)

## 2011-12-10 LAB — D-DIMER, QUANTITATIVE (NOT AT ARMC): D-Dimer, Quant: 0.24 ug/mL-FEU (ref 0.00–0.48)

## 2011-12-10 MED ORDER — IBUPROFEN 400 MG PO TABS: 400.0000 mg | ORAL_TABLET | Freq: Three times a day (TID) | ORAL | Status: AC | PRN

## 2011-12-10 MED ORDER — DIAZEPAM 5 MG PO TABS: 5.0000 mg | ORAL_TABLET | Freq: Two times a day (BID) | ORAL | Status: AC

## 2011-12-10 MED ORDER — ACETAMINOPHEN 500 MG PO TABS
1000.0000 mg | ORAL_TABLET | Freq: Once | ORAL | Status: AC
  Administered 2011-12-10: 1000 mg via ORAL
  Filled 2011-12-10: qty 1

## 2011-12-10 MED ORDER — HYDROCODONE-ACETAMINOPHEN 5-325 MG PO TABS: 1.0000 | ORAL_TABLET | ORAL | Status: AC | PRN

## 2011-12-10 MED ORDER — SODIUM CHLORIDE 0.9 % IV SOLN: INTRAVENOUS | Status: DC

## 2011-12-10 MED ORDER — SODIUM CHLORIDE 0.9 % IV SOLN
INTRAVENOUS | Status: DC
  Administered 2011-12-10: 05:00:00 via INTRAVENOUS

## 2011-12-10 MED ORDER — MORPHINE SULFATE 4 MG/ML IJ SOLN
4.0000 mg | Freq: Once | INTRAMUSCULAR | Status: AC
  Administered 2011-12-10: 4 mg via INTRAVENOUS
  Filled 2011-12-10: qty 1

## 2011-12-10 MED ORDER — SODIUM CHLORIDE 0.9 % IV BOLUS (SEPSIS)
500.0000 mL | Freq: Once | INTRAVENOUS | Status: AC
  Administered 2011-12-10: 500 mL via INTRAVENOUS

## 2011-12-10 MED ORDER — ACETAMINOPHEN 325 MG PO TABS
ORAL_TABLET | ORAL | Status: AC
  Filled 2011-12-10: qty 3

## 2011-12-10 MED ORDER — ONDANSETRON HCL 4 MG/2ML IJ SOLN
4.0000 mg | Freq: Once | INTRAMUSCULAR | Status: AC
  Administered 2011-12-10: 4 mg via INTRAVENOUS
  Filled 2011-12-10: qty 2

## 2011-12-10 NOTE — ED Provider Notes (Signed)
History     CSN: [id number]  Arrival date & time 12/09/11  2204   First MD Initiated Contact with Patient 12/10/11 0028      Chief Complaint  Patient presents with  . Fall     Patient is a 51 y.o. female presenting with fall.  Fall The accident occurred 3 to 5 hours ago. The fall occurred while walking. She fell from a height of 3 to 5 ft. She landed on concrete. There was no blood loss. The point of impact was the left hip and left knee. The pain is present in the left hip and left knee. The pain is at a severity of 9/10. The pain is severe. She was ambulatory at the scene. There was no entrapment after the fall. There was no drug use involved in the accident. There was no alcohol use involved in the accident. Pertinent negatives include no visual change, no fever, no abdominal pain, no nausea, no vomiting, no loss of consciousness and no tingling. The symptoms are aggravated by activity. She has tried nothing for the symptoms.  Pt states she fell while leaving her dr's office today. States she was waiting for her daughter to pick her up and when she arrived she went to step backwards and fell. States she landed on her (L) side and since has had (L) hip and (L)  knee pain. Also reports pain in lower back and initially some numbness in her bil arms which has since resolved. Denies recent illness or symptoms. States was seeing PCP for a "check-up".  Past Medical History  Diagnosis Date  . Diabetes mellitus     History reviewed. No pertinent past surgical history.  History reviewed. No pertinent family history.  History  Substance Use Topics  . Smoking status: Not on file  . Smokeless tobacco: Not on file  . Alcohol Use:     OB History    Grav Para Term Preterm Abortions TAB SAB Ect Mult Living                  Review of Systems  Constitutional: Negative.  Negative for fever and chills.  HENT: Negative.   Eyes: Negative.   Respiratory: Negative.  Negative for cough.     Cardiovascular: Negative.   Gastrointestinal: Negative.  Negative for nausea, vomiting, abdominal pain and diarrhea.  Genitourinary: Negative.  Negative for dysuria.  Musculoskeletal: Negative.   Skin: Negative.   Neurological: Negative.  Negative for tingling and loss of consciousness.  Hematological: Negative.   Psychiatric/Behavioral: Negative.     Allergies  Review of patient's allergies indicates no known allergies.  Home Medications   Current Outpatient Rx  Name Route Sig Dispense Refill  . INSULIN ISOPHANE HUMAN 100 UNIT/ML [place] SUSP Subcutaneous Inject 40 Units into the skin daily.      BP 148/117  Pulse 97  Temp(Src) 100.4 F (38 C) (Oral)  Resp 20  Physical Exam  Constitutional: She is oriented to person, place, and time. She appears well-developed and well-nourished.       obese  HENT:  Head: Normocephalic and atraumatic.  Eyes: Conjunctivae are normal.  Neck: Neck supple.  Cardiovascular: Normal rate and regular rhythm.   Pulmonary/Chest: Effort normal and breath sounds normal.  Abdominal: Soft. Bowel sounds are normal.  Musculoskeletal: Normal range of motion.       Left hip: She exhibits tenderness.       Back:       Legs:  No obvious signs of trauma to (L) hip, lower back or (L) knee.  Neurological: She is alert and oriented to person, place, and time.  Skin: Skin is warm and dry. No erythema.  Psychiatric: She has a normal mood and affect.    ED Course  Procedures   Date: 12/10/2011  Rate: 95  Rhythm: Sinus rhythm  QRS Axis: normal  Intervals: normal  ST/T Wave abnormalities: normal  Conduction Disutrbances:none  Narrative Interpretation: Prominent P-waves  Old EKG Reviewed: changes noted  Pt s/p fall this afternoon with (L) hip, knee, neck and LBP. Pt is tachycardic. Will obtain imaging, initiate IVF's and re-evaluate.  0515: Pt reports feeling much better since IV medication for pain. Pt temp now 101.6. Pt continues to deny additional  c/o's. U/A unremarkable. Will medicate for fever, obtain CXR. And re-eval.  0600: Temp now 99. Hr 95. CXR w/o acute findings. Will apply ace to (L) knee,  plan for d/c home and encourage rest, fluids and close f/u w/ PCP. Pt agreeable w/ plan.    Labs Reviewed  GLUCOSE, CAPILLARY - Abnormal; Notable for the following:    Glucose-Capillary 187 (*)    All other components within normal limits  URINALYSIS, ROUTINE W REFLEX MICROSCOPIC   No results found.   No diagnosis found.    MDM  HPI/PE and clinical findings c/w 1. Mechanical fall 2. Cervical strain (c-spine xr w.o acute findings) 3. (L) Hip sprain ((L) hip xr w/o acute findings) 4. (L) knee sprain (L) knee xr w/o acute findings 5. Fever of unknown origin (likely viral, u/a & CXR w/o acute findings)        Leanne Chang, NP 12/10/11 2155

## 2011-12-10 NOTE — ED Provider Notes (Signed)
Medical screening examination/treatment/procedure(s) were conducted as a shared visit with non-physician practitioner(s) and myself.  I personally evaluated the patient during the encounter  Mechanical fall with L knee and hip pain.  No LOC.  Tachycardic.  Abdomen soft and nontender. Developed fever while in ED.  CXR, UA negative.  Nontoxic.  Tachycardia responding to IVF and antipyretics.  Hemoglobin stable. No abdominal pain or chest pain.  D-dimer neg.  Glynn Octave, MD 12/10/11 2256

## 2011-12-10 NOTE — ED Notes (Signed)
Patient transported to X-ray 

## 2011-12-10 NOTE — Discharge Instructions (Signed)
Please read over the instructions below. Your xrays tonight are all normal. Expect to be very sore for the next couple of days. Take the Ibuprofen for 3 days as directed and the prescribed medication for pain as needed for more severe pain. We have also prescribed muscle relaxers for muscle pain and spasms.  We have placed an Ace Wrap to your left knee for comfort. Wear until discomfort improves. You may want to apply ice (1st 24-48 hours) then heat to sore areas.  You began to run a fever here tonight which has improved with IV fluids and Tylenol. The exact source of the fever is unknown since your urine test and chest xray are both normal. The likely source of the fever is a viral syndrome (see information below). Rest today, take Ibuprofen as directed. You can also take Tylenol if needed for fever. Drink plenty of liquids and arrange follow up with Dr Andrey Campanile at [facility] if fever persist. Return for worsening symptoms.  Cervical Sprain and Strain A cervical sprain is an injury to the neck. The injury can include either over-stretching or even small tears in the ligaments that hold the bones of the neck in place. A strain affects muscles and tendons. Minor injuries usually only involve ligaments and muscles. Because the different parts of the neck are so close together, more severe injuries can involve both sprain and strain. These injuries can affect the muscles, ligaments, tendons, discs, and nerves in the neck. CAUSES  An injury may be the result of a direct blow or from certain habits that can lead to the symptoms noted above.  Injury from:   Contact sports (such as football, rugby, wrestling, hockey, auto racing, gymnastics, diving, martial arts, and boxing).   Motor vehicle accidents.   Whiplash injuries (see image at right). These are common. They occur when the neck is forcefully whipped or forced backward and/or forward.   Falls.   Lifestyle or awkward postures:   Cradling a telephone  between the ear and shoulder.   Sitting in a chair that offers no support.   Working at an [employer] park manager station.   Activities that require hours of repeated or long periods of looking up (stretching the neck backward) or looking down (bending the head/neck forward).  SYMPTOMS   Pain, soreness, stiffness, or burning sensation in the front, back, or sides of the neck. This may develop immediately after injury. Onset of discomfort may also develop slowly and not begin for 24 hours or more.   Shoulder and/or upper back pain.   Limits to the normal movement of the neck.   Headache.   Dizziness.   Weakness and/or abnormal sensation (such as numbness or tingling) of one or both arms and/or hands.   Muscle spasm.   Difficulty with swallowing or chewing.   Tenderness and swelling at the injury site.  DIAGNOSIS  Most of the time, your caregiver can diagnose this problem with a careful history and examination. The history will include information about known problems (such as arthritis in the neck) or a previous neck injury. X-rays may be ordered to find out if there is a different problem. X-rays can also help to find problems with the bones of the neck not related to the injury or current symptoms. TREATMENT  Several treatment options are available to help pain, spasm, and other symptoms. They include:  Cold helps relieve pain and reduce inflammation. Cold should be applied for 10 to 15 minutes every 2 to 3 hours  after any activity that aggravates your symptoms. Use ice packs or an ice massage. Place a towel or cloth in between your skin and the ice pack.   Medication:   Only take over-the-counter or prescription medicines for pain, discomfort, or fever as directed by your caregiver.   Pain relievers or muscle relaxants may be prescribed. Use only as directed and only as much as you need.   Change in the activity that caused the problem. This might include using a headset with  a telephone so that the phone is not propped between your ear and shoulder.   Neck collar. Your caregiver may recommend temporary use of a soft cervical collar.   Work station. Changes may be needed in your work place. A better sitting position and/or better posture during work may be part of your treatment.   Physical Therapy. Your caregiver may recommend physical therapy. This can include instructions in the use of stretching and strengthening exercises. Improvement in posture is important. Exercises and posture training can help stabilize the neck and strengthen muscles and keep symptoms from returning.  HOME CARE INSTRUCTIONS  Other than formal physical therapy, all treatments above can be done at home. Even when not at work, it is important to be conscious of your posture and of activities that can cause a return of symptoms. Most cervical sprains and/or strains are better in 1-3 weeks. As you improve and increase activities, doing a warm up and stretching before the activity will help prevent recurrent problems. SEEK MEDICAL CARE IF:   Pain is not effectively controlled with medication.   You feel unable to decrease pain medication over time as planned.   Activity level is not improving as planned and/or expected.  SEEK IMMEDIATE MEDICAL CARE IF:   While using medication, you develop any bleeding, stomach upset, or signs of an allergic reaction.   Symptoms get worse, become intolerable, and are not helped by medications.   New, unexplained symptoms develop.   You experience numbness, tingling, weakness, or paralysis of any part of your body.  MAKE SURE YOU:   Understand these instructions.   Will watch your condition.   Will get help right away if you are not doing well or get worse.  Document Released: 08/10/2007 Document Revised: 06/25/2011 Document Reviewed: 08/10/2007 [facility] Patient Information 2012 [place], [place].Cervical Strain Care After A cervical strain is when  the muscles and ligaments in your neck have been stretched. The bones are not broken. If you had any problems moving your arms or legs immediately after the injury, even if the problem has gone away, make sure to tell this to your caregiver.  HOME CARE INSTRUCTIONS   While awake, apply ice packs to the neck or areas of pain about every 1 to 2 hours, for 15 to 20 minutes at a time. Do this for 2 days. If you were given a cervical collar for support, ask your caregiver if you may remove it for bathing or applying ice.   If given a cervical collar, wear as instructed. Do not remove any collar unless instructed by a caregiver.   Only take over-the-counter or prescription medicines for pain, discomfort, or fever as directed by your caregiver.  Recheck with the hospital or clinic after a radiologist has read your X-rays. Recheck with the hospital or clinic to make sure the initial readings are correct. Do this also to determine if you need further studies. It is your responsibility to find out your X-ray results. X-rays are  sometimes repeated in one week to ten days. These are often repeated to make sure that a hairline fracture was not overlooked. Ask your caregiver how you are to find out about your radiology (X-ray) results. SEEK IMMEDIATE MEDICAL CARE IF:   You have increasing pain in your neck.   You develop difficulties swallowing or breathing.   You have numbness, weakness, or movement problems in the arms or legs.   You have difficulty walking.   You develop bowel or bladder retention or incontinence.   You have problems with walking.  MAKE SURE YOU:   Understand these instructions.   Will watch your condition.   Will get help right away if you are not doing well or get worse.  Document Released: 10/13/2005 Document Revised: 06/25/2011 Document Reviewed: 05/26/2008 Exit[employer] Patient Information 2012 ExitCare, LLCFever, Adult A fever is a temperature over 98.6 F (37.0 C). HOME  CARE  Rest.   Wear lightweight, loose clothes.   Place a cold towel on your forehead.   Drink enough fluids to keep your pee (urine) clear or pale yellow. Do not drink alcohol.   If your fever stays over 103 F (39.4 C):   A warm shower may help.   Do not take a cold shower. This may make the fever go up.   If your fever is down to 100 to 101 F (37.8 to 38.3 C):   Continue with fever control.   Follow up with your doctor as needed.   Only take medicine as told by your doctor.  GET HELP RIGHT AWAY IF:   You have new problems that get worse.   Your medicine does not help you.   You have an oral temperature above 102 F (38.9 C), not controlled by medicine.  MAKE SURE YOU:   Understand these instructions.   Will watch your condition.   Will get help right away if you are not doing well or get worse.  Document Released: 07/22/2008 Document Revised: 06/25/2011 Document Reviewed: 07/22/2008 [facility] Patient Information 2012 [place], [place].Hip Injury You have a been diagnosed with a hip injury. Falls can cause fractures to the pelvis and hip as well as very painful bruises. These are called 'hip pointers'. Muscle injuries, arthritis, sciatica, and bursitis and can also cause severe pelvic or hip pain. An x-ray exam will usually show a fractured bone, but sometimes other studies like a CT scan or MRI may be needed to be certain about the diagnosis. All painful hip injuries should be treated like there might be a fracture until they are better or determined not to be fractures. Rest in bed over the next 3-4 days, or as long as you have severe pain. You should not bear weight on your injured hip. Use crutches or a walker. Ice packs can be applied to the injury site for 20 minutes every 2-4 hours for several days. Pain medicine may be needed to help you rest, especially at night.  A follow-up exam and further studies to determine the cause of your pain are important if your  symptoms do not improve rapidly over the next week.  SEEK IMMEDIATE MEDICAL CARE IF:  You re-injure your hip.   You develop more pain, a fever, inability to walk, or other problems.  MAKE SURE YOU:   Understand these instructions.   Will watch your condition.   Will get help right away if you are not doing well or get worse.  Document Released: 11/20/2004 Document Revised: 06/25/2011 Document Reviewed:  02/01/2009 [employer] Patient Information 2012 [place], [place].Knee Sprain You have a knee sprain. Sprains are painful injuries to the joints. A sprain is a partial or complete tearing of ligaments. Ligaments are tough, fibrous tissues that hold bones together at the joints. A strain (sprain) has occurred when a ligament is stretched or damaged. This injury may take several weeks to heal. This is often the same length of time as a bone fracture (break in bone) takes to heal. Even though a fracture (bone break) may not have occurred, the recovery times may be similar. HOME CARE INSTRUCTIONS   Rest the injured area for as long as directed by your caregiver. Then slowly start using the joint as directed by your caregiver and as the pain allows. Use crutches as directed. If the knee was splinted or casted, continue use and care as directed. If an ace bandage has been applied today, it should be removed and reapplied every 3 to 4 hours. It should not be applied tightly, but firmly enough to keep swelling down. Watch toes and feet for swelling, bluish discoloration, coldness, numbness or excessive pain. If any of these symptoms occur, remove the ace bandage and reapply more loosely.If these symptoms persist, seek medical attention.   For the first 24 hours, lie down. Keep the injured extremity elevated on two pillows.   Apply ice to the injured area for 15 to 20 minutes every couple hours. Repeat this 3 to 4 times per day for the first 48 hours. Put the ice in a plastic bag and place a towel between the  bag of ice and your skin.   Wear any splinting, casting, or elastic bandage applications as instructed.   Only take over-the-counter or prescription medicines for pain, discomfort, or fever as directed by your caregiver. Do not use aspirin immediately after the injury unless instructed by your caregiver. Aspirin can cause increased bleeding and bruising of the tissues.   If you were given crutches, continue to use them as instructed. Do not resume weight bearing on the affected extremity until instructed.  Persistent pain and inability to use the injured area as directed for more than 2 to 3 days are warning signs. If this happens you should see a caregiver for a follow-up visit as soon as possible. Initially, a hairline fracture (this is the same as a broken bone) may not be evident on x-rays. Persistent pain and swelling indicate that further evaluation, non-weight bearing (use of crutches as instructed), and/or further x-rays are indicated. X-rays may sometimes not show a small fracture until a week or ten days later. Make a follow-up appointment with your own caregiver or one to whom we have referred you. A radiologist (specialist in reading x-rays) may re-read your X-rays. Make sure you know how you are to get your x-ray results. Do not assume everything is normal if you do not hear from [place]. SEEK MEDICAL CARE IF:   Bruising, swelling, or pain increases.   You have cold or numb toes   You have continuing difficulty or pain with walking.  SEEK IMMEDIATE MEDICAL CARE IF:   Your toes are cold, numb or blue.   The pain is not responding to medications and continues to stay the same or get worse.  MAKE SURE YOU:   Understand these instructions.   Will watch your condition.   Will get help right away if you are not doing well or get worse.  Document Released: 10/13/2005 Document Revised: 06/25/2011 Document Reviewed: 09/27/2007 [employer]  Patient Information 2012 [place], [place].Knee  Sprain You have a knee sprain. Sprains are painful injuries to the joints. A sprain is a partial or complete tearing of ligaments. Ligaments are tough, fibrous tissues that hold bones together at the joints. A strain (sprain) has occurred when a ligament is stretched or damaged. This injury may take several weeks to heal. This is often the same length of time as a bone fracture (break in bone) takes to heal. Even though a fracture (bone break) may not have occurred, the recovery times may be similar. HOME CARE INSTRUCTIONS   Rest the injured area for as long as directed by your caregiver. Then slowly start using the joint as directed by your caregiver and as the pain allows. Use crutches as directed. If the knee was splinted or casted, continue use and care as directed. If an ace bandage has been applied today, it should be removed and reapplied every 3 to 4 hours. It should not be applied tightly, but firmly enough to keep swelling down. Watch toes and feet for swelling, bluish discoloration, coldness, numbness or excessive pain. If any of these symptoms occur, remove the ace bandage and reapply more loosely.If these symptoms persist, seek medical attention.   For the first 24 hours, lie down. Keep the injured extremity elevated on two pillows.   Apply ice to the injured area for 15 to 20 minutes every couple hours. Repeat this 3 to 4 times per day for the first 48 hours. Put the ice in a plastic bag and place a towel between the bag of ice and your skin.   Wear any splinting, casting, or elastic bandage applications as instructed.   Only take over-the-counter or prescription medicines for pain, discomfort, or fever as directed by your caregiver. Do not use aspirin immediately after the injury unless instructed by your caregiver. Aspirin can cause increased bleeding and bruising of the tissues.   If you were given crutches, continue to use them as instructed. Do not resume weight bearing on the  affected extremity until instructed.  Persistent pain and inability to use the injured area as directed for more than 2 to 3 days are warning signs. If this happens you should see a caregiver for a follow-up visit as soon as possible. Initially, a hairline fracture (this is the same as a broken bone) may not be evident on x-rays. Persistent pain and swelling indicate that further evaluation, non-weight bearing (use of crutches as instructed), and/or further x-rays are indicated. X-rays may sometimes not show a small fracture until a week or ten days later. Make a follow-up appointment with your own caregiver or one to whom we have referred you. A radiologist (specialist in reading x-rays) may re-read your X-rays. Make sure you know how you are to get your x-ray results. Do not assume everything is normal if you do not hear from [place]. SEEK MEDICAL CARE IF:   Bruising, swelling, or pain increases.   You have cold or numb toes   You have continuing difficulty or pain with walking.  SEEK IMMEDIATE MEDICAL CARE IF:   Your toes are cold, numb or blue.   The pain is not responding to medications and continues to stay the same or get worse.  MAKE SURE YOU:   Understand these instructions.   Will watch your condition.   Will get help right away if you are not doing well or get worse.  Document Released: 10/13/2005 Document Revised: 06/25/2011 Document Reviewed: 09/27/2007 [employer] Patient  Information 2012 [place], [place].

## 2012-07-26 ENCOUNTER — Emergency Department (HOSPITAL_COMMUNITY)
Admission: EM | Admit: 2012-07-26 | Discharge: 2012-07-27 | Disposition: A | Discharge status: Home / Self Care | Payer: Self-pay | Attending: Emergency Medicine | Admitting: Emergency Medicine

## 2012-07-26 ENCOUNTER — Encounter (HOSPITAL_COMMUNITY): Payer: Self-pay | Admitting: *Deleted

## 2012-07-26 DIAGNOSIS — R112 Nausea with vomiting, unspecified: Secondary | ICD-10-CM | POA: Insufficient documentation

## 2012-07-26 DIAGNOSIS — I252 Old myocardial infarction: Secondary | ICD-10-CM | POA: Insufficient documentation

## 2012-07-26 DIAGNOSIS — E119 Type 2 diabetes mellitus without complications: Secondary | ICD-10-CM | POA: Insufficient documentation

## 2012-07-26 DIAGNOSIS — R197 Diarrhea, unspecified: Secondary | ICD-10-CM | POA: Insufficient documentation

## 2012-07-26 HISTORY — DX: Acute myocardial infarction, unspecified: I21.9

## 2012-07-26 LAB — CBC WITH DIFFERENTIAL/PLATELET
Basophils Absolute: 0 10*3/uL (ref 0.0–0.1)
Basophils Relative: 0 % (ref 0–1)
Eosinophils Absolute: 0 10*3/uL (ref 0.0–0.7)
Eosinophils Relative: 0 % (ref 0–5)
HCT: 40.7 % (ref 36.0–46.0)
Hemoglobin: 13.9 g/dL (ref 12.0–15.0)
Lymphocytes Relative: 7 % — ABNORMAL LOW (ref 12–46)
Lymphs Abs: 0.4 10*3/uL — ABNORMAL LOW (ref 0.7–4.0)
MCH: 29.4 pg (ref 26.0–34.0)
MCHC: 34.2 g/dL (ref 30.0–36.0)
MCV: 86 fL (ref 78.0–100.0)
Monocytes Absolute: 0.4 10*3/uL (ref 0.1–1.0)
Monocytes Relative: 6 % (ref 3–12)
Neutro Abs: 5.3 10*3/uL (ref 1.7–7.7)
Neutrophils Relative %: 87 % — ABNORMAL HIGH (ref 43–77)
Platelets: 207 10*3/uL (ref 150–400)
RBC: 4.73 MIL/uL (ref 3.87–5.11)
RDW: 12.2 % (ref 11.5–15.5)
WBC: 6.1 10*3/uL (ref 4.0–10.5)

## 2012-07-26 LAB — URINALYSIS, ROUTINE W REFLEX MICROSCOPIC
Hgb urine dipstick: NEGATIVE
Nitrite: NEGATIVE
Protein, ur: NEGATIVE mg/dL
Specific Gravity, Urine: 1.031 — ABNORMAL HIGH (ref 1.005–1.030)
Urobilinogen, UA: 0.2 mg/dL (ref 0.0–1.0)

## 2012-07-26 LAB — COMPREHENSIVE METABOLIC PANEL
ALT: 34 U/L (ref 0–35)
BUN: 10 mg/dL (ref 6–23)
CO2: 26 mEq/L (ref 19–32)
Calcium: 8.8 mg/dL (ref 8.4–10.5)
GFR calc Af Amer: >90 mL/min (ref >90)
GFR calc non Af Amer: >90 mL/min (ref >90)
Glucose, Bld: 247 mg/dL — ABNORMAL HIGH (ref 70–99)
Sodium: 132 mEq/L — ABNORMAL LOW (ref 135–145)
Total Protein: 6.9 g/dL (ref 6.0–8.3)

## 2012-07-26 LAB — URINE MICROSCOPIC-ADD ON

## 2012-07-26 LAB — LIPASE, BLOOD: Lipase: 22 U/L (ref 11–59)

## 2012-07-26 LAB — BLOOD GAS, VENOUS
Acid-Base Excess: 4.5 mmol/L — ABNORMAL HIGH (ref 0.0–2.0)
Bicarbonate: 29 mEq/L — ABNORMAL HIGH (ref 20.0–24.0)
TCO2: 25.8 mmol/L (ref 0–100)
pCO2, Ven: 45 mmHg (ref 45.0–50.0)
pH, Ven: 7.426 — ABNORMAL HIGH (ref 7.250–7.300)

## 2012-07-26 MED ORDER — SODIUM CHLORIDE 0.9 % IV SOLN
Freq: Once | INTRAVENOUS | Status: AC
  Administered 2012-07-26: 21:00:00 via INTRAVENOUS

## 2012-07-26 MED ORDER — HYDROMORPHONE HCL PF 1 MG/ML IJ SOLN
1.0000 mg | Freq: Once | INTRAMUSCULAR | Status: AC
  Administered 2012-07-26: 1 mg via INTRAVENOUS
  Filled 2012-07-26: qty 1

## 2012-07-26 MED ORDER — ONDANSETRON 4 MG PO TBDP: 4.0000 mg | ORAL_TABLET | Freq: Three times a day (TID) | ORAL | Status: DC | PRN

## 2012-07-26 MED ORDER — SODIUM CHLORIDE 0.9 % IV SOLN
Freq: Once | INTRAVENOUS | Status: AC
  Administered 2012-07-26: 23:00:00 via INTRAVENOUS

## 2012-07-26 MED ORDER — ONDANSETRON HCL 4 MG/2ML IJ SOLN
4.0000 mg | Freq: Once | INTRAMUSCULAR | Status: AC
  Administered 2012-07-26: 4 mg via INTRAVENOUS
  Filled 2012-07-26: qty 2

## 2012-07-26 MED ORDER — ACETAMINOPHEN 325 MG PO TABS
650.0000 mg | ORAL_TABLET | Freq: Once | ORAL | Status: AC
  Administered 2012-07-26: 650 mg via ORAL
  Filled 2012-07-26: qty 2

## 2012-07-26 MED ORDER — ONDANSETRON 8 MG PO TBDP
8.0000 mg | ORAL_TABLET | Freq: Once | ORAL | Status: AC
  Administered 2012-07-26: 8 mg via ORAL
  Filled 2012-07-26: qty 1

## 2012-07-26 NOTE — ED Notes (Signed)
Pt states started having abdominal pain, nausea, vomiting, diarrhea, body aches this morning.

## 2012-07-26 NOTE — ED Provider Notes (Signed)
History     CSN: [id number]  Arrival date & time 07/26/12  1624   First MD Initiated Contact with Patient 07/26/12 1925      No chief complaint on file.   HPI The patient presents with one day of diffuse discomfort, nausea, vomiting, diarrhea, fever.  She was at a conference for the past 2 days.  Symptoms began earlier today, subacutely.  Since onset symptoms been persistent, with no attempts at relief with OTC medication.  She notes innumerable episodes of vomiting and diarrhea as well as the generalized discomfort.  She denies focal chest pain, dyspnea, lightheadedness, syncope. There was one other conference participants who is similarly ill.  Past Medical History  Diagnosis Date  . Diabetes mellitus   . Heart attack     Past Surgical History  Procedure Date  . Colon surgery     History reviewed. No pertinent family history.  History  Substance Use Topics  . Smoking status: Never Smoker   . Smokeless tobacco: Never Used  . Alcohol Use: No    OB History    Grav Para Term Preterm Abortions TAB SAB Ect Mult Living                  Review of Systems  Constitutional:       HPI  HENT:       HPI otherwise negative  Eyes: Negative.   Respiratory:       HPI, otherwise negative  Cardiovascular:       HPI, otherwise nmegative  Gastrointestinal: Positive for nausea, vomiting, abdominal pain and diarrhea.  Genitourinary:       HPI, otherwise negative  Musculoskeletal:       HPI, otherwise negative  Skin: Negative.   Neurological: Negative for syncope.    Allergies  Coconut flavor  Home Medications   Current Outpatient Rx  Name Route Sig Dispense Refill  . INSULIN ISOPHANE HUMAN 100 UNIT/ML [place] SUSP Subcutaneous Inject 40 Units into the skin daily.      BP 126/78  Pulse 101  Temp 101.2 F (38.4 C) (Oral)  Resp 20  SpO2 95%  Physical Exam  Nursing note and vitals reviewed. Constitutional: She is oriented to person, place, and time. She appears  well-developed and well-nourished. No distress.  HENT:  Head: Normocephalic and atraumatic.  Eyes: Conjunctivae normal and EOM are normal.  Cardiovascular: Regular rhythm.  Tachycardia present.   Pulmonary/Chest: Effort normal and breath sounds normal. No stridor. No respiratory distress.  Abdominal: She exhibits no distension.  Musculoskeletal: She exhibits no edema.  Neurological: She is alert and oriented to person, place, and time. No cranial nerve deficit.  Skin: Skin is warm and dry.  Psychiatric: She has a normal mood and affect.    ED Course  Procedures (including critical care time)  Labs Reviewed  CBC WITH DIFFERENTIAL - Abnormal; Notable for the following:    Neutrophils Relative 87 (*)     Lymphocytes Relative 7 (*)     Lymphs Abs 0.4 (*)     All other components within normal limits  COMPREHENSIVE METABOLIC PANEL - Abnormal; Notable for the following:    Sodium 132 (*)     Chloride 95 (*)     Glucose, Bld 247 (*)     Albumin 3.4 (*)     All other components within normal limits  URINALYSIS, ROUTINE W REFLEX MICROSCOPIC - Abnormal; Notable for the following:    Color, Urine AMBER (*)  BIOCHEMICALS MAY BE  AFFECTED BY COLOR   APPearance CLOUDY (*)     Specific Gravity, Urine 1.031 (*)     Glucose, UA >1000 (*)     Bilirubin Urine SMALL (*)     Ketones, ur 15 (*)     All other components within normal limits  URINE MICROSCOPIC-ADD ON - Abnormal; Notable for the following:    Squamous Epithelial / LPF FEW (*)     Bacteria, UA FEW (*)     All other components within normal limits  LIPASE, BLOOD   No results found.   No diagnosis found.  Tachycardic 11 sinus abnormal Pulse ox 96% room air normal   Update: Patient states that she is feeling better.  MDM  This generally well female, aside from diabetes presents with the acute onset of nausea, vomiting, diarrhea, fever, generalized discomfort.  Notably, the patient was at a gathering, and at least one other  attendee has a similar illness, suggestive of infectious etiology.  Patient's labs are notable for hyperglycemia, ketonuria, but are not notably abnormal.  Following several liters of IV fluids, analgesics, the patient was substantially better.  She was discharged in stable condition with return precautions, PMD followup.  Gerhard Munch, MD 07/26/12 2311

## 2012-07-27 LAB — GLUCOSE, CAPILLARY: Glucose-Capillary: 199 mg/dL — ABNORMAL HIGH (ref 70–99)

## 2012-07-27 MED ORDER — HYDROMORPHONE HCL PF 1 MG/ML IJ SOLN
1.0000 mg | Freq: Once | INTRAMUSCULAR | Status: AC
  Administered 2012-07-27: 1 mg via INTRAVENOUS
  Filled 2012-07-27: qty 1

## 2013-06-10 ENCOUNTER — Emergency Department (HOSPITAL_COMMUNITY): Payer: No Typology Code available for payment source

## 2013-06-10 ENCOUNTER — Encounter (HOSPITAL_COMMUNITY): Payer: Self-pay | Admitting: Emergency Medicine

## 2013-06-10 ENCOUNTER — Emergency Department (HOSPITAL_COMMUNITY)
Admission: EM | Admit: 2013-06-10 | Discharge: 2013-06-10 | Disposition: A | Discharge status: Home / Self Care | Payer: No Typology Code available for payment source | Attending: Emergency Medicine | Admitting: Emergency Medicine

## 2013-06-10 DIAGNOSIS — E119 Type 2 diabetes mellitus without complications: Secondary | ICD-10-CM | POA: Insufficient documentation

## 2013-06-10 DIAGNOSIS — M5430 Sciatica, unspecified side: Secondary | ICD-10-CM | POA: Insufficient documentation

## 2013-06-10 DIAGNOSIS — Y92410 Unspecified street and highway as the place of occurrence of the external cause: Secondary | ICD-10-CM | POA: Insufficient documentation

## 2013-06-10 DIAGNOSIS — S39012A Strain of muscle, fascia and tendon of lower back, initial encounter: Secondary | ICD-10-CM

## 2013-06-10 DIAGNOSIS — Y9389 Activity, other specified: Secondary | ICD-10-CM | POA: Insufficient documentation

## 2013-06-10 DIAGNOSIS — S335XXA Sprain of ligaments of lumbar spine, initial encounter: Secondary | ICD-10-CM | POA: Insufficient documentation

## 2013-06-10 DIAGNOSIS — Z8674 Personal history of sudden cardiac arrest: Secondary | ICD-10-CM | POA: Insufficient documentation

## 2013-06-10 DIAGNOSIS — S0993XA Unspecified injury of face, initial encounter: Secondary | ICD-10-CM | POA: Insufficient documentation

## 2013-06-10 DIAGNOSIS — Z794 Long term (current) use of insulin: Secondary | ICD-10-CM | POA: Insufficient documentation

## 2013-06-10 HISTORY — DX: Sciatica, unspecified side: M54.30

## 2013-06-10 MED ORDER — OXYCODONE-ACETAMINOPHEN 5-325 MG PO TABS
1.0000 | ORAL_TABLET | Freq: Once | ORAL | Status: AC
  Administered 2013-06-10: 1 via ORAL
  Filled 2013-06-10 (×2): qty 1

## 2013-06-10 MED ORDER — HYDROCODONE-ACETAMINOPHEN 5-325 MG PO TABS
1.0000 | ORAL_TABLET | Freq: Once | ORAL | Status: DC
  Filled 2013-06-10: qty 1

## 2013-06-10 MED ORDER — OXYCODONE-ACETAMINOPHEN 5-325 MG PO TABS: 1.0000 | ORAL_TABLET | Freq: Four times a day (QID) | ORAL | Status: DC | PRN

## 2013-06-10 MED ORDER — CYCLOBENZAPRINE HCL 10 MG PO TABS: 10.0000 mg | ORAL_TABLET | Freq: Three times a day (TID) | ORAL | Status: DC | PRN
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
## 2013-06-10 NOTE — ED Provider Notes (Signed)
Medical screening examination/treatment/procedure(s) were performed by non-physician practitioner and as supervising physician I was immediately available for consultation/collaboration.   Darlys Gales, MD 06/10/13 2232

## 2014-07-24 ENCOUNTER — Ambulatory Visit: Payer: Self-pay | Admitting: Medical

## 2014-08-08 ENCOUNTER — Encounter (HOSPITAL_COMMUNITY): Payer: Self-pay | Admitting: Emergency Medicine

## 2014-08-08 ENCOUNTER — Emergency Department (INDEPENDENT_AMBULATORY_CARE_PROVIDER_SITE_OTHER)
Admission: EM | Admit: 2014-08-08 | Discharge: 2014-08-08 | Disposition: A | Discharge status: Home / Self Care | Payer: No Typology Code available for payment source | Source: Home / Self Care | Attending: Family Medicine | Admitting: Family Medicine

## 2014-08-08 DIAGNOSIS — B370 Candidal stomatitis: Secondary | ICD-10-CM

## 2014-08-08 DIAGNOSIS — B3731 Acute candidiasis of vulva and vagina: Secondary | ICD-10-CM

## 2014-08-08 DIAGNOSIS — B373 Candidiasis of vulva and vagina: Secondary | ICD-10-CM

## 2014-08-08 MED ORDER — FLUCONAZOLE 150 MG PO TABS: 150.0000 mg | ORAL_TABLET | Freq: Once | ORAL | Status: DC

## 2014-08-08 MED ORDER — CLOTRIMAZOLE 10 MG MT TROC: 10.0000 mg | Freq: Every day | OROMUCOSAL | Status: DC

## 2014-08-08 MED ORDER — TERCONAZOLE 80 MG VA SUPP: 80.0000 mg | Freq: Every day | VAGINAL | Status: DC

## 2014-08-08 NOTE — ED Provider Notes (Signed)
CSN: [id number]     Arrival date & time 08/08/14  1922 History   First MD Initiated Contact with Patient 08/08/14 1928     Chief Complaint  Patient presents with  . Vaginitis   (Consider location/radiation/quality/duration/timing/severity/associated sxs/prior Treatment) Patient is a 53 y.o. female presenting with female genitourinary complaint. The history is provided by the patient.  Female GU Problem This is a new problem. The current episode started more than 2 days ago. The problem has not changed since onset.Associated symptoms comments: Thrush and vaginal yeast, poorly controlled diabetes, recurrent similar problem..    Past Medical History  Diagnosis Date  . Diabetes mellitus   . Heart attack   . Sciatica    Past Surgical History  Procedure Laterality Date  . Colon surgery     History reviewed. No pertinent family history. History  Substance Use Topics  . Smoking status: Never Smoker   . Smokeless tobacco: Never Used  . Alcohol Use: No   OB History   Grav Para Term Preterm Abortions TAB SAB Ect Mult Living                 Review of Systems  Constitutional: Negative.   Gastrointestinal: Negative.   Genitourinary: Positive for vaginal discharge. Negative for vaginal bleeding.    Allergies  Coconut flavor  Home Medications   Prior to Admission medications   Medication Sig Start Date End Date Taking? Authorizing Provider  insulin glargine (LANTUS) 100 units/mL SOLN Inject 30 Units into the skin 2 (two) times daily.   Yes Historical Provider, MD  insulin NPH (HUMULIN N,NOVOLIN N) 100 UNIT/ML injection Inject 40 Units into the skin daily.   Yes Historical Provider, MD  insulin regular (NOVOLIN R,HUMULIN R) 100 units/mL injection Inject 15 Units into the skin 3 (three) times daily before meals.   Yes Historical Provider, MD  pregabalin (LYRICA) 75 MG capsule Take 75 mg by mouth 3 (three) times daily.   Yes Historical Provider, MD  clotrimazole (MYCELEX) 10 MG  troche Take 1 tablet (10 mg total) by mouth 5 (five) times daily. 08/08/14   Linna HoffJames D Kindl, MD  cyclobenzaprine (FLEXERIL) 10 MG tablet Take 1 tablet (10 mg total) by mouth 3 (three) times daily as needed for muscle spasms. 06/10/13   Jamesetta Orleanshristopher W Lawyer, PA-C  fluconazole (DIFLUCAN) 150 MG tablet Take 1 tablet (150 mg total) by mouth once. Repeat in 1 week. 08/08/14   Linna HoffJames D Kindl, MD  ketoprofen (ORUDIS) 75 MG capsule Take 75 mg by mouth 4 (four) times daily as needed for pain.    Historical Provider, MD  oxyCODONE-acetaminophen (PERCOCET/ROXICET) 5-325 MG per tablet Take 1 tablet by mouth every 6 (six) hours as needed for pain. 06/10/13   Jamesetta Orleanshristopher W Lawyer, PA-C  terconazole (TERAZOL 3) 80 MG vaginal suppository Place 1 suppository (80 mg total) vaginally at bedtime. 08/08/14   Linna HoffJames D Kindl, MD   BP 129/65  Pulse 98  Temp(Src) 98.6 F (37 C) (Oral)  Resp 12  SpO2 99% Physical Exam  Nursing note and vitals reviewed. Constitutional: She is oriented to person, place, and time. She appears well-developed and well-nourished.  HENT:  Head: Normocephalic.  Right Ear: External ear normal.  Left Ear: External ear normal.  Mouth/Throat: Oropharynx is clear and moist.  Oral candida rash.  Genitourinary: Vaginal discharge found.  Neurological: She is alert and oriented to person, place, and time.  Skin: Skin is warm and dry.    ED Course  Procedures (including  critical care time) Labs Review Labs Reviewed - No data to display  Imaging Review No results found.   MDM   1. Candida vaginitis   2. Oral thrush        Linna HoffJames D Kindl, MD 08/08/14 [phone]

## 2014-08-08 NOTE — ED Notes (Signed)
C/o vaginal irritation.  Pt has used monistat with no relief.  Also c/o thrush.  And several area on skin with possible yeast.  Pt has used baby powder with no relief.

## 2015-03-28 ENCOUNTER — Other Ambulatory Visit: Payer: Self-pay | Admitting: Family Medicine

## 2015-03-28 DIAGNOSIS — R748 Abnormal levels of other serum enzymes: Secondary | ICD-10-CM

## 2015-04-06 ENCOUNTER — Other Ambulatory Visit: Payer: No Typology Code available for payment source

## 2015-04-06 ENCOUNTER — Ambulatory Visit
Admission: RE | Admit: 2015-04-06 | Discharge: 2015-04-06 | Disposition: A | Discharge status: Home / Self Care | Payer: 59 | Source: Ambulatory Visit | Attending: Family Medicine | Admitting: Family Medicine

## 2015-04-06 DIAGNOSIS — R748 Abnormal levels of other serum enzymes: Secondary | ICD-10-CM

## 2015-06-14 ENCOUNTER — Other Ambulatory Visit: Payer: Self-pay | Admitting: Family Medicine

## 2015-06-14 ENCOUNTER — Other Ambulatory Visit: Payer: Self-pay

## 2015-06-14 DIAGNOSIS — Z1231 Encounter for screening mammogram for malignant neoplasm of breast: Secondary | ICD-10-CM

## 2015-06-19 ENCOUNTER — Ambulatory Visit
Admission: RE | Admit: 2015-06-19 | Discharge: 2015-06-19 | Disposition: A | Discharge status: Home / Self Care | Payer: 59 | Source: Ambulatory Visit | Attending: Family Medicine | Admitting: Family Medicine

## 2015-06-19 DIAGNOSIS — Z1231 Encounter for screening mammogram for malignant neoplasm of breast: Secondary | ICD-10-CM

## 2015-07-17 ENCOUNTER — Emergency Department (HOSPITAL_COMMUNITY)
Admission: EM | Admit: 2015-07-17 | Discharge: 2015-07-17 | Disposition: A | Discharge status: Home / Self Care | Payer: Worker's Compensation | Source: Home / Self Care

## 2015-07-17 NOTE — ED Notes (Signed)
No one answered, no one in lobby, a person seen leaving that did not speak to staff

## 2015-07-18 ENCOUNTER — Encounter (HOSPITAL_COMMUNITY): Payer: Self-pay | Admitting: Emergency Medicine

## 2015-07-18 ENCOUNTER — Emergency Department (INDEPENDENT_AMBULATORY_CARE_PROVIDER_SITE_OTHER): Payer: 59

## 2015-07-18 ENCOUNTER — Emergency Department (INDEPENDENT_AMBULATORY_CARE_PROVIDER_SITE_OTHER)
Admission: EM | Admit: 2015-07-18 | Discharge: 2015-07-18 | Disposition: A | Discharge status: Home / Self Care | Payer: Worker's Compensation | Source: Home / Self Care | Attending: Emergency Medicine | Admitting: Emergency Medicine

## 2015-07-18 DIAGNOSIS — R05 Cough: Secondary | ICD-10-CM

## 2015-07-18 DIAGNOSIS — M5441 Lumbago with sciatica, right side: Secondary | ICD-10-CM | POA: Diagnosis not present

## 2015-07-18 MED ORDER — PREDNISONE 50 MG PO TABS: ORAL_TABLET | ORAL | Status: DC

## 2015-07-18 MED ORDER — MELOXICAM 15 MG PO TABS: 15.0000 mg | ORAL_TABLET | Freq: Every day | ORAL | Status: DC

## 2015-07-18 MED ORDER — OXYCODONE-ACETAMINOPHEN 5-325 MG PO TABS: 1.0000 | ORAL_TABLET | Freq: Four times a day (QID) | ORAL | Status: DC | PRN

## 2015-07-18 NOTE — Discharge Instructions (Signed)
I'm sorry your back has been flared up. Take prednisone daily for 5 days. Take meloxicam daily until you follow up with the orthopedic doctor. Use the Percocet as needed for severe pain. I recommend he see an orthopedic doctor, preferably your own orthopedic doctor.
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
## 2015-07-24 ENCOUNTER — Emergency Department (INDEPENDENT_AMBULATORY_CARE_PROVIDER_SITE_OTHER)
Admission: EM | Admit: 2015-07-24 | Discharge: 2015-07-24 | Disposition: A | Discharge status: Home / Self Care | Payer: 59 | Source: Home / Self Care | Attending: Family Medicine | Admitting: Family Medicine

## 2015-07-24 ENCOUNTER — Encounter (HOSPITAL_COMMUNITY): Payer: Self-pay | Admitting: *Deleted

## 2015-07-24 DIAGNOSIS — R05 Cough: Secondary | ICD-10-CM

## 2015-07-24 DIAGNOSIS — R112 Nausea with vomiting, unspecified: Secondary | ICD-10-CM

## 2015-07-24 DIAGNOSIS — R059 Cough, unspecified: Secondary | ICD-10-CM

## 2015-07-24 MED ORDER — ONDANSETRON 8 MG PO TBDP: 8.0000 mg | ORAL_TABLET | Freq: Three times a day (TID) | ORAL | Status: DC | PRN

## 2015-07-24 MED ORDER — AZITHROMYCIN 250 MG PO TABS: 250.0000 mg | ORAL_TABLET | Freq: Every day | ORAL | Status: DC

## 2015-07-24 NOTE — ED Provider Notes (Addendum)
CSN: [id number]     Arrival date & time 07/24/15  1328 History   First MD Initiated Contact with Patient 07/24/15 1422     Chief Complaint  Patient presents with  . Cough   (Consider location/radiation/quality/duration/timing/severity/associated sxs/prior Treatment) The history is provided by the patient.   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 She complains about cough which is occasionally productive and intermittent and has been going on for 2 months. She's tried nasal sprays as well as inhalers. She does have a history of asthma as a child.  She has no hemoptysis or shortness of breath. She also denies chest pain.  Past Medical History  Diagnosis Date  . Diabetes mellitus   . Heart attack   . Sciatica    Past Surgical History  Procedure Laterality Date  . Colon surgery     History reviewed. No pertinent family history. Social History  Substance Use Topics  . Smoking status: Never Smoker   . Smokeless tobacco: Never Used  . Alcohol Use: No   OB History    No data available     Review of Systems  Constitutional: Negative.   HENT: Negative.   Eyes: Negative.   Respiratory: Positive for cough.   Cardiovascular: Negative.   Gastrointestinal: Negative.   Genitourinary: Negative.   Neurological: Negative.     Allergies  Coconut flavor  Home Medications   Prior to Admission medications   Medication Sig Start Date End Date Taking? Authorizing Provider  azithromycin (ZITHROMAX) 250 MG tablet Take 1 tablet (250 mg total) by mouth daily. Take first 2 tablets together, then 1 every day until finished. 07/24/15   Elvina Sidle, MD  clotrimazole (MYCELEX) 10 MG troche Take 1 tablet (10 mg total) by mouth 5 (five) times daily. 08/08/14   Linna Hoff, MD  cyclobenzaprine (FLEXERIL) 10 MG tablet Take 1 tablet (10 mg total) by mouth 3 (three) times daily as needed for muscle spasms. 06/10/13   Charlestine Night, PA-C  fluconazole (DIFLUCAN) 150 MG tablet Take 1  tablet (150 mg total) by mouth once. Repeat in 1 week. 08/08/14   Linna Hoff, MD  gabapentin (NEURONTIN) 300 MG capsule Take 300 mg by mouth 3 (three) times daily.    Historical Provider, MD  insulin glargine (LANTUS) 100 units/mL SOLN Inject 30 Units into the skin 2 (two) times daily.    Historical Provider, MD  insulin NPH (HUMULIN N,NOVOLIN N) 100 UNIT/ML injection Inject 40 Units into the skin daily.    Historical Provider, MD  insulin regular (NOVOLIN R,HUMULIN R) 100 units/mL injection Inject 15 Units into the skin 3 (three) times daily before meals.    Historical Provider, MD  ketoprofen (ORUDIS) 75 MG capsule Take 75 mg by mouth 4 (four) times daily as needed for pain.    Historical Provider, MD  meloxicam (MOBIC) 15 MG tablet Take 1 tablet (15 mg total) by mouth daily. 07/18/15   Charm Rings, MD  ondansetron (ZOFRAN-ODT) 8 MG disintegrating tablet Take 1 tablet (8 mg total) by mouth every 8 (eight) hours as needed for nausea. 07/24/15   Elvina Sidle, MD  oxyCODONE-acetaminophen (PERCOCET/ROXICET) 5-325 MG per tablet Take 1 tablet by mouth every 6 (six) hours as needed for severe pain. 07/18/15   Charm Rings, MD  pregabalin (LYRICA) 75 MG capsule Take 75 mg by mouth 3 (three) times daily.    Historical Provider, MD   Meds Ordered and Administered this Visit  Medications - No  data to display  BP 138/82 mmHg  Pulse 86  Temp(Src) 98.4 F (36.9 C) (Oral)  Resp 16  SpO2 95% No data found.   Physical Exam  Constitutional: She is oriented to person, place, and time. She appears well-developed and well-nourished.  HENT:  Head: Normocephalic and atraumatic.  Right Ear: External ear normal.  Left Ear: External ear normal.  Mouth/Throat: Oropharynx is clear and moist.  Nasal passages are clear and wide-open \ The uvula has been removed surgically with soft palate scar remaining.  Neck: Normal range of motion.  Pulmonary/Chest: Effort normal and breath sounds normal. No respiratory  distress. She has no wheezes. She has no rales. She exhibits no tenderness.  Abdominal: Soft.  Musculoskeletal: Normal range of motion.  Neurological: She is oriented to person, place, and time.  Skin: Skin is warm and dry.  Psychiatric: She has a normal mood and affect. Her behavior is normal. Thought content normal.  Nursing note and vitals reviewed.   ED Course  Procedures (including critical care time)   At the time of discharge, patient started vomiting and requested Zofran.  MDM      ICD-9-CM ICD-10-CM   1. Cough 786.2 R05 azithromycin (ZITHROMAX) 250 MG tablet  2. Non-intractable vomiting with nausea, vomiting of unspecified type 787.01 R11.2 ondansetron (ZOFRAN-ODT) 8 MG disintegrating tablet     Signed, Elvina Sidle, MD     Elvina Sidle, MD 07/24/15 1443  Elvina Sidle, MD 07/24/15 1610  Elvina Sidle, MD 07/24/15 1501

## 2015-07-24 NOTE — ED Notes (Signed)
Pt  Reports  Symptoms  of cough   /  Congested    With     Nausea       And  Tightness  In  Her  Chest  With  Symptoms     Off  And  On  For  Several  Nights          Has  Seen pcp         Had  cxr  As   Well

## 2015-07-24 NOTE — Discharge Instructions (Signed)
See your primary care doctor if symptoms persist.

## 2015-07-31 ENCOUNTER — Institutional Professional Consult (permissible substitution): Payer: 59 | Admitting: Pulmonary Disease

## 2015-08-15 ENCOUNTER — Ambulatory Visit (INDEPENDENT_AMBULATORY_CARE_PROVIDER_SITE_OTHER): Payer: 59 | Admitting: Pulmonary Disease

## 2015-08-15 ENCOUNTER — Encounter: Payer: Self-pay | Admitting: Pulmonary Disease

## 2015-08-15 ENCOUNTER — Telehealth: Payer: Self-pay | Admitting: Pulmonary Disease

## 2015-08-15 ENCOUNTER — Other Ambulatory Visit: Payer: 59

## 2015-08-15 VITALS — BP 122/72 | HR 78 | Ht 65.0 in | Wt 260.6 lb

## 2015-08-15 DIAGNOSIS — R05 Cough: Secondary | ICD-10-CM

## 2015-08-15 DIAGNOSIS — R059 Cough, unspecified: Secondary | ICD-10-CM | POA: Insufficient documentation

## 2015-08-15 DIAGNOSIS — K449 Diaphragmatic hernia without obstruction or gangrene: Secondary | ICD-10-CM

## 2015-08-15 DIAGNOSIS — E119 Type 2 diabetes mellitus without complications: Secondary | ICD-10-CM | POA: Insufficient documentation

## 2015-08-15 DIAGNOSIS — K219 Gastro-esophageal reflux disease without esophagitis: Secondary | ICD-10-CM

## 2015-08-15 DIAGNOSIS — G4733 Obstructive sleep apnea (adult) (pediatric): Secondary | ICD-10-CM | POA: Insufficient documentation

## 2015-08-15 DIAGNOSIS — M5430 Sciatica, unspecified side: Secondary | ICD-10-CM | POA: Insufficient documentation

## 2015-08-15 NOTE — Telephone Encounter (Signed)
IMAGING CXR PA/LAT 07/12/15 (per radiologist): Heart and mediastinal contours within normal limits. No focal opacity or effusion.  CARDIAC TTE (09/03/09): LV normal in size. EF 60-65%. LA & RA normal in size. RV size normal. No aortic regurgitation. No mitral regurgitation. Trivial tricuspid regurgitation. Trivial pericardial effusion.  LABS 07/12/15 CBC: 6.0/13.3/39.5/238 ESR: 36 (0-25) BMP: 141/5.3/98/30/8/0.6/299/9.5 LFT: 0.2/6.7/0.6/87/36/24 Ace: 66

## 2015-08-15 NOTE — Progress Notes (Signed)
Subjective:    Patient ID: Veronica [name], female    DOB: [birth year], 54 y.o.   MRN: [id number]  HPI In reviewing the patient's office records she reportedly developed a cough in May 2016. At that time she was prescribed an antiallergy medicine as well as saline nasal spray. She was seen again in June and was prescribed Flonase, Tessalon Perles, & amoxicillin. Later on in June the patient denied a cough. In August she was prescribed Singulair & Breo which per records help the cough somewhat. During her appointment in September her inhaler was renewed and dexamethasone was administered IM. She reports in her 67s she starting having frequent respiratory infections & recurrent pneumonia.   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 She reports that when she rolls the windows of her car down or the air conditioning is running she will have coughing. She has also noticed coughing with laying on her left or right side. She reports her cough produces a clear mucus. She reports it comes up like "soap suds" and is foamy. She reports her cough seems to be worse in her bathroom and bedroom. She has noticed it even when she goes to her father's home. She will sometimes have post-tussive emesis. She does have sinus congestion, teary eyes, and runny nose with her coughing spells. Otherwise she has no sinus congestion or post-nasal drainage. She denies any reflux, dyspepsia, or morning brash water taste. No dysphagia or odynophagia. She does have wheezing, especially laying down. She does have dyspnea but predominantly with her coughing spells. She reports she did have a rash on her right shoulder the other day but no other rashes after 24 hours. No bruising. She reports her cough totally resolved for a couple of weeks after her IM Decadron injection. Her wheezing also resolved. The patient has not been using her Breo inhaler on a daily basis.  Review of Systems  She denies any chest pain. She reports infrequent chest pressure & tightness with her coughing spells. She denies any joint pain, swelling, or stiffness in her hands. She does have chronic pain from a prior back injury. A pertinent 14 point review of systems is negative except as per the history of presenting illness.  Allergies  Allergen Reactions  . Coconut Flavor Hives, Itching and Rash    Current Outpatient Prescriptions on File Prior to Visit  Medication Sig Dispense Refill  . cyclobenzaprine (FLEXERIL) 10 MG tablet Take 1 tablet (10 mg total) by mouth 3 (three) times daily as needed for muscle spasms. 15 tablet 0  . gabapentin (NEURONTIN) 300 MG capsule Take 300 mg by mouth 3 (three) times daily.    . insulin NPH (HUMULIN N,NOVOLIN N) 100 UNIT/ML injection Inject 30 Units into the skin 2 (two) times daily before a meal.     . insulin regular (NOVOLIN R,HUMULIN R) 100 units/mL injection Inject 30 Units into the skin 2 (two) times daily before a meal.     . ondansetron (ZOFRAN-ODT) 8 MG disintegrating tablet Take 1 tablet (8 mg total) by mouth every 8 (eight) hours as needed for nausea. 15 tablet 0  . insulin glargine (LANTUS) 100 units/mL SOLN Inject 30 Units into the skin 2 (two) times daily.     No current facility-administered medications on file prior to visit.    Past Medical History  Diagnosis Date  . Diabetes mellitus   . Heart attack ([facility]) 08/2009  .  Sciatica   . Cough   . H/O diverticulitis of colon     Past Surgical History  Procedure Laterality Date  . Nasal reconstruction    . Tonsillectomy and adenoidectomy    . Uvulopalatopharyngoplasty    . Partial colectomy      Family History  Problem Relation Age of Onset  . Diabetes Mother     type 2  . Diabetes      cousin type 1  . Diabetes Brother   . Congestive Heart Failure Brother   . Emphysema Paternal Grandfather   . Tuberculosis Paternal Grandfather     Social History   Social History  . Marital  Status: Married    Spouse Name: N/A  . Number of Children: N/A  . Years of Education: N/A   Social History Main Topics  . Smoking status: Former Smoker -- 4 years    Quit date: 10/28/1987  . Smokeless tobacco: Never Used     Comment: 1 pack per 2 months  . Alcohol Use: No  . Drug Use: No  . Sexual Activity: Not Asked   Other Topics Concern  . None   Social History Narrative   Originally from [place]. Always lived in [place]. Prior travel to [place], [place], [place], & [place]. Currently works in a call center. 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 She has no pets currently. No bird or hot tub exposure. She reports that there has been mold in her basement before that was treated twice. She also has mold in her bathroom & bedroom.       Objective:   Physical Exam Blood pressure 122/72, pulse 78, height '5\' 5"'  (1.651 m), weight 260 lb 9.6 oz (118.207 kg), SpO2 96 %. General:  Awake. Alert. No acute distress. Obese African-American female.  Integument:  Warm & dry. No rash on exposed skin. No bruising. Lymphatics:  No appreciated cervical or supraclavicular lymphadenoapthy. HEENT:  Moist mucus membranes. No oral ulcers. No scleral injection or icterus. Status post UPPP surgery. Cardiovascular:  Regular rate. No edema. No appreciable JVD.  Pulmonary:  Good aeration & clear to auscultation bilaterally. Symmetric chest wall expansion. No accessory muscle use. Abdomen: Soft. Normal bowel sounds. Protuberant. Grossly nontender. Musculoskeletal:  Normal bulk and tone. Hand grip strength 5/5 bilaterally. No joint deformity or effusion appreciated. Neurological:  CN 2-12 grossly in tact. No meningismus. Moving all 4 extremities equally. Symmetric brachioradialis deep tendon reflexes. Psychiatric:  Mood and affect congruent. Speech normal rhythm, rate & tone.   IMAGING  CXR PA/LAT 07/12/15 (per radiologist): Heart and mediastinal contours within normal limits. No focal opacity or  effusion.  CARDIAC TTE (09/03/09): LV normal in size. EF 60-65%. LA & RA normal in size. RV size normal. No aortic regurgitation. No mitral regurgitation. Trivial tricuspid regurgitation. Trivial pericardial effusion.  LABS 07/12/15 CBC: 6.0/13.3/39.5/238 Eos:  0.0 ESR: 36 (0-25) BMP: 141/5.3/98/30/8/0.6/299/9.5 LFT: 0.2/6.7/0.6/87/36/24 Ace: 66    Assessment & Plan:  54 year old female with a persistent cough. Certainly the patient's mold exposure at home could be inciting her cough. This could also be exacerbating underlying asthma given her history of recurrent respiratory infections beginning in her 69s. This preceded her move to her current residence. She has not been routinely using her inhaled steroid which may account for her lack of response. Given the patient's significant response to her IM injection of steroids I would favor an inflammatory process such as asthma contributing to her chronic cough. With her history  of hiatal hernia and reflux silent laryngo-esophageal reflux must also be considered in the absence of overt symptoms. I instructed the patient contact my office if she had any further questions or concerns before her next appointment. 1. Cough: Checking full pulmonary function testing. Checking sputum culture for AFB, fungus, and bacteria. Checking chest CT scan. Continuing patient on daily Breo and instructed on proper technique and oral hygiene. Checking serum IgE, quantitative immunoglobulin panel, & RAST panel. 2. OSA: Status post UPPP surgery. Reports good quality of sleep. 3. GERD with hiatal hernia: Checking barium swallow. 4. Follow-up: Patient will return to clinic in 4-6 weeks or sooner if needed.

## 2015-08-15 NOTE — Patient Instructions (Signed)
1. Please remember to use your Breo inhaler one inhalation once daily. Remember to rinse, gargle, and spit after use as well as pressure teeth and tone to prevent thrush. 2. I'm ordering a CT scan of your chest to evaluate for possible fungus in your lungs. 3. I'm ordering a swallowing test to evaluate for possible reflux from your hiatal hernia contributing to your cough. 4. We will check breathing tests on her before your next appointment. 5. I'm checking a culture of her sputum to see if we can grow out any fungus that could be contributing to your cough. 6. Please consider having her home inspected by a professional to determine how best to deal with the mold. 7. I will see you back in 4-6 weeks but please call my office if you have any further questions or concerns.

## 2015-08-16 ENCOUNTER — Other Ambulatory Visit: Payer: 59

## 2015-08-16 DIAGNOSIS — R059 Cough, unspecified: Secondary | ICD-10-CM

## 2015-08-16 DIAGNOSIS — R05 Cough: Secondary | ICD-10-CM

## 2015-08-16 LAB — IGG, IGA, IGM
IGM, SERUM: 157 mg/dL (ref 52–322)
IgA: 185 mg/dL (ref 69–380)
IgG (Immunoglobin G), Serum: 1010 mg/dL (ref 690–1700)

## 2015-08-17 LAB — ALLERGY FULL PROFILE
Allergen, D pternoyssinus,d7: <0.1 kU/L
Bahia Grass: <0.1 kU/L
Bermuda Grass: <0.1 kU/L
Box Elder IgE: 0.24 kU/L — ABNORMAL HIGH
Candida Albicans: <0.1 kU/L
Cat Dander: 0.31 kU/L — ABNORMAL HIGH
Curvularia lunata: <0.1 kU/L
Dog Dander: <0.1 kU/L
Elm IgE: <0.1 kU/L
Fescue: <0.1 kU/L
G009 Red Top: <0.1 kU/L
IgE (Immunoglobulin E), Serum: 66 kU/L (ref <115)
Lamb's Quarters: <0.1 kU/L
Oak: <0.1 kU/L
Plantain: <0.1 kU/L

## 2015-08-19 LAB — RESPIRATORY CULTURE OR RESPIRATORY AND SPUTUM CULTURE
Culture: NORMAL
GRAM STAIN: NONE SEEN
GRAM STAIN: NONE SEEN
ORGANISM ID, BACTERIA: NORMAL

## 2015-08-24 ENCOUNTER — Telehealth: Payer: Self-pay | Admitting: Pulmonary Disease

## 2015-08-24 ENCOUNTER — Ambulatory Visit (HOSPITAL_COMMUNITY)
Admission: RE | Admit: 2015-08-24 | Discharge: 2015-08-24 | Disposition: A | Discharge status: Home / Self Care | Payer: 59 | Source: Ambulatory Visit | Attending: Pulmonary Disease | Admitting: Pulmonary Disease

## 2015-08-24 ENCOUNTER — Ambulatory Visit (INDEPENDENT_AMBULATORY_CARE_PROVIDER_SITE_OTHER)
Admission: RE | Admit: 2015-08-24 | Discharge: 2015-08-24 | Disposition: A | Discharge status: Home / Self Care | Payer: 59 | Source: Ambulatory Visit | Attending: Pulmonary Disease | Admitting: Pulmonary Disease

## 2015-08-24 DIAGNOSIS — K219 Gastro-esophageal reflux disease without esophagitis: Secondary | ICD-10-CM | POA: Diagnosis not present

## 2015-08-24 DIAGNOSIS — R05 Cough: Secondary | ICD-10-CM

## 2015-08-24 DIAGNOSIS — R059 Cough, unspecified: Secondary | ICD-10-CM

## 2015-08-24 DIAGNOSIS — K449 Diaphragmatic hernia without obstruction or gangrene: Secondary | ICD-10-CM | POA: Insufficient documentation

## 2015-08-24 NOTE — Telephone Encounter (Signed)
Spoke with the patient and relayed the results of her CT scan as well as her Barium Swallow. She reports that when she takes pain medication for her back this seem to relieve her cough. Still awaiting PFTs. May require methacholine challenge testing.  IMAGING CT CHEST W/O 08/24/15 (personally reviewed by me):  5mm nodule in peripheral superior segment right lower lobe. No other nodule or opacity. No pleural effusion or thickening. Esophagus appears normal. No pathologic mediastinal adenopathy. No pericardial effusion.  BARIUM SWALLOW 08/24/15 (per radiologist):  Normal esophageal motility without mass or ulceration. No stricture. No reflux.  MICROBIOLOGY SPUTUM (08/16/15):  Oral Flora / AFB pending / Fungal Pending  LABS 08/15/15 IgG:  1010 IgA:  184 IgM:  157 IgE:  66 RAST PANEL:  [place] 0/I & [place] 0/I

## 2015-09-11 LAB — FUNGUS CULTURE W SMEAR: SMEAR RESULT: NONE SEEN

## 2015-10-09 ENCOUNTER — Ambulatory Visit: Payer: Self-pay | Admitting: Pulmonary Disease

## 2015-10-26 ENCOUNTER — Ambulatory Visit (INDEPENDENT_AMBULATORY_CARE_PROVIDER_SITE_OTHER): Payer: 59 | Admitting: Pulmonary Disease

## 2015-10-26 DIAGNOSIS — R059 Cough, unspecified: Secondary | ICD-10-CM

## 2015-10-26 DIAGNOSIS — R05 Cough: Secondary | ICD-10-CM | POA: Diagnosis not present

## 2015-10-26 LAB — PULMONARY FUNCTION TEST
DL/VA % pred: 120 %
DL/VA: 5.98 ml/min/mmHg/L
DLCO UNC: 24.24 ml/min/mmHg
DLCO unc % pred: 92 %
FEF 25-75 Post: 3.9 L/sec
FEF 25-75 Pre: 3.54 L/sec
FEF2575-%CHANGE-POST: 10 %
FEF2575-%PRED-PRE: 151 %
FEF2575-%Pred-Post: 166 %
FEV1-%CHANGE-POST: 2 %
FEV1-%PRED-POST: 97 %
FEV1-%PRED-PRE: 95 %
FEV1-PRE: 2.22 L
FEV1-Post: 2.27 L
FEV1FVC-%CHANGE-POST: 2 %
FEV1FVC-%Pred-Pre: 112 %
FEV6-%CHANGE-POST: 0 %
FEV6-%PRED-PRE: 86 %
FEV6-%Pred-Post: 87 %
FEV6-PRE: 2.47 L
FEV6-Post: 2.48 L
FEV6FVC-%PRED-PRE: 103 %
FEV6FVC-%Pred-Post: 103 %
FVC-%Change-Post: 0 %
FVC-%PRED-PRE: 84 %
FVC-%Pred-Post: 84 %
FVC-POST: 2.48 L
FVC-PRE: 2.47 L
POST FEV1/FVC RATIO: 92 %
PRE FEV6/FVC RATIO: 100 %
Post FEV6/FVC ratio: 100 %
Pre FEV1/FVC ratio: 90 %

## 2015-10-26 NOTE — Progress Notes (Signed)
PFT done today. 10/26/2015 

## 2015-11-14 ENCOUNTER — Ambulatory Visit: Payer: Self-pay | Admitting: Pulmonary Disease

## 2016-07-18 ENCOUNTER — Encounter (HOSPITAL_COMMUNITY): Payer: Self-pay | Admitting: Emergency Medicine

## 2016-07-18 ENCOUNTER — Emergency Department (HOSPITAL_COMMUNITY): Payer: BLUE CROSS/BLUE SHIELD

## 2016-07-18 ENCOUNTER — Emergency Department (HOSPITAL_COMMUNITY)
Admission: EM | Admit: 2016-07-18 | Discharge: 2016-07-18 | Disposition: A | Discharge status: Home / Self Care | Payer: BLUE CROSS/BLUE SHIELD | Attending: Emergency Medicine | Admitting: Emergency Medicine

## 2016-07-18 DIAGNOSIS — S8991XA Unspecified injury of right lower leg, initial encounter: Secondary | ICD-10-CM | POA: Diagnosis not present

## 2016-07-18 DIAGNOSIS — Y999 Unspecified external cause status: Secondary | ICD-10-CM | POA: Diagnosis not present

## 2016-07-18 DIAGNOSIS — E119 Type 2 diabetes mellitus without complications: Secondary | ICD-10-CM | POA: Diagnosis not present

## 2016-07-18 DIAGNOSIS — S199XXA Unspecified injury of neck, initial encounter: Secondary | ICD-10-CM | POA: Diagnosis present

## 2016-07-18 DIAGNOSIS — T148XXA Other injury of unspecified body region, initial encounter: Secondary | ICD-10-CM

## 2016-07-18 DIAGNOSIS — Y939 Activity, unspecified: Secondary | ICD-10-CM | POA: Diagnosis not present

## 2016-07-18 DIAGNOSIS — S5001XA Contusion of right elbow, initial encounter: Secondary | ICD-10-CM | POA: Diagnosis not present

## 2016-07-18 DIAGNOSIS — W19XXXA Unspecified fall, initial encounter: Secondary | ICD-10-CM

## 2016-07-18 DIAGNOSIS — Z794 Long term (current) use of insulin: Secondary | ICD-10-CM | POA: Diagnosis not present

## 2016-07-18 DIAGNOSIS — Y92242 Post office as the place of occurrence of the external cause: Secondary | ICD-10-CM | POA: Insufficient documentation

## 2016-07-18 DIAGNOSIS — S161XXA Strain of muscle, fascia and tendon at neck level, initial encounter: Secondary | ICD-10-CM | POA: Diagnosis not present

## 2016-07-18 DIAGNOSIS — Z87891 Personal history of nicotine dependence: Secondary | ICD-10-CM | POA: Insufficient documentation

## 2016-07-18 DIAGNOSIS — W010XXA Fall on same level from slipping, tripping and stumbling without subsequent striking against object, initial encounter: Secondary | ICD-10-CM | POA: Insufficient documentation

## 2016-07-18 DIAGNOSIS — Z79899 Other long term (current) drug therapy: Secondary | ICD-10-CM | POA: Diagnosis not present

## 2016-07-18 MED ORDER — OXYCODONE-ACETAMINOPHEN 5-325 MG PO TABS
1.0000 | ORAL_TABLET | Freq: Once | ORAL | Status: AC
  Administered 2016-07-18: 1 via ORAL
  Filled 2016-07-18: qty 1

## 2016-07-18 MED ORDER — IBUPROFEN 800 MG PO TABS: 800.0000 mg | ORAL_TABLET | Freq: Three times a day (TID) | ORAL | 0 refills | Status: DC | PRN

## 2016-07-18 MED ORDER — HYDROCODONE-ACETAMINOPHEN 5-325 MG PO TABS: 1.0000 | ORAL_TABLET | Freq: Four times a day (QID) | ORAL | 0 refills | Status: DC | PRN

## 2016-07-18 NOTE — Discharge Instructions (Signed)
Return here as needed.  Follow-up with your primary care doctor.  Your x-rays did not show any abnormalities.  Ice and heat on the areas that are sore

## 2016-07-18 NOTE — ED Triage Notes (Addendum)
Pt from [place] following [place] post office [place]. Pt has a minor scrape on her right knee and a minor scrape on her left elbow. Pt denies head injury or LOC. Pt has complaints of generalized pain. Pt states this happened because she was turning to see a friend which called the fall. Pt denies blood thinner.

## 2016-07-18 NOTE — ED Provider Notes (Signed)
[facility]-EMERGENCY DEPT Provider Note   CSN: [id number] Arrival date & time: 07/18/16  1947   By signing my name below, I, Christel MormonMatthew Jamison, attest that this documentation has been prepared under the direction and in the presence of Eli Lilly and CompanyChristopher Riya Huxford, PA-C. Electronically Signed: Christel MormonMatthew Jamison, Scribe. 07/18/2016. 8:39 PM.   History   Chief Complaint Chief Complaint  Patient presents with  . Fall    The history is provided by the patient. No language interpreter was used.    HPI Comments:  Veronica [name] is a 55 y.o. female who presents to the Emergency Department s/p a fall that occurred at 1300 today in which she sustained several injuries. Pt reports that she fell at the post office today and sustained wounds to her R elbow, R knee and R leg. Pt also notes pain in her neck neck and radiating down her back. Pt denies head trauma or LOC. Past Medical History:  Diagnosis Date  . Cough   . Diabetes mellitus   . H/O diverticulitis of colon   . Heart attack ([facility]) 08/2009  . Sciatica     Patient Active Problem List   Diagnosis Date Noted  . Cough 08/15/2015  . OSA (obstructive sleep apnea) 08/15/2015  . Sciatica 08/15/2015  . Diabetes mellitus ([facility]) 08/15/2015  . Hiatal hernia 08/15/2015  . GERD (gastroesophageal reflux disease) 08/15/2015    Past Surgical History:  Procedure Laterality Date  . NASAL RECONSTRUCTION    . PARTIAL COLECTOMY    . TONSILLECTOMY AND ADENOIDECTOMY    . UVULOPALATOPHARYNGOPLASTY      OB History    No data available       Home Medications    Prior to Admission medications   Medication Sig Start Date End Date Taking? Authorizing Provider  acetaminophen-codeine (TYLENOL #3) 300-30 MG tablet Take 1 tablet by mouth every 4 (four) hours as needed for moderate pain.    Historical Provider, MD  cyclobenzaprine (FLEXERIL) 10 MG tablet Take 1 tablet (10 mg total) by mouth 3 (three) times daily as needed for muscle spasms. 06/10/13   Charlestine Nighthristopher  Emaline Karnes, PA-C  Fluticasone Furoate-Vilanterol (BREO ELLIPTA) 100-25 MCG/INH AEPB Inhale 1 puff into the lungs daily.    Historical Provider, MD  gabapentin (NEURONTIN) 300 MG capsule Take 300 mg by mouth 3 (three) times daily.    Historical Provider, MD  insulin glargine (LANTUS) 100 units/mL SOLN Inject 30 Units into the skin 2 (two) times daily.    Historical Provider, MD  insulin NPH (HUMULIN N,NOVOLIN N) 100 UNIT/ML injection Inject 30 Units into the skin 2 (two) times daily before a meal.     Historical Provider, MD  insulin regular (NOVOLIN R,HUMULIN R) 100 units/mL injection Inject 30 Units into the skin 2 (two) times daily before a meal.     Historical Provider, MD  montelukast (SINGULAIR) 10 MG tablet Take 10 mg by mouth daily.    Historical Provider, MD  ondansetron (ZOFRAN-ODT) 8 MG disintegrating tablet Take 1 tablet (8 mg total) by mouth every 8 (eight) hours as needed for nausea. 07/24/15   Elvina SidleKurt Lauenstein, MD    Family History Family History  Problem Relation Age of Onset  . Diabetes Mother     type 2  . Diabetes      cousin type 1  . Diabetes Brother   . Congestive Heart Failure Brother   . Emphysema Paternal Grandfather   . Tuberculosis Paternal Grandfather     Social History Social History  Substance Use  Topics  . Smoking status: Former Smoker    Years: 4.00    Quit date: 10/28/1987  . Smokeless tobacco: Never Used     Comment: 1 pack per 2 months  . Alcohol use No     Allergies   Coconut flavor   Review of Systems Review of Systems  Musculoskeletal: Positive for back pain, joint swelling and neck pain.  Skin: Positive for wound.  Neurological: Negative for syncope.     Physical Exam Updated Vital Signs BP 146/75 (BP Location: Left Arm)   Pulse 90   Temp 100 F (37.8 C) (Oral)   Resp 20   SpO2 94%   Physical Exam  Constitutional: She appears well-developed and well-nourished. No distress.  HENT:  Head: Normocephalic and atraumatic.  Eyes:  Pupils are equal, round, and reactive to light.  Cardiovascular: Normal rate and regular rhythm.   No murmur heard. Pulmonary/Chest: Effort normal and breath sounds normal. No respiratory distress.  Musculoskeletal:       Right elbow: She exhibits normal range of motion, no swelling and no effusion. Tenderness found. No radial head, no medial epicondyle and no lateral epicondyle tenderness noted.       Right knee: She exhibits normal range of motion, no swelling, no effusion, no ecchymosis, no deformity, no laceration and no erythema. Tenderness found.       Cervical back: She exhibits tenderness and pain. She exhibits normal range of motion, no bony tenderness, no swelling, no deformity, no laceration and no spasm.       Back:       Legs: Neurological: She is alert.  Skin: Skin is warm and dry.  Psychiatric: She has a normal mood and affect.  Nursing note and vitals reviewed.    ED Treatments / Results  DIAGNOSTIC STUDIES:  Oxygen Saturation is 94% on RA, adequate by my interpretation.    COORDINATION OF CARE:  8:39 PM Discussed treatment plan with pt at bedside and pt agreed to plan.   Labs (all labs ordered are listed, but only abnormal results are displayed) Labs Reviewed - No data to display  EKG  EKG Interpretation None       Radiology No results found.  Procedures Procedures (including critical care time)  Medications Ordered in ED Medications - No data to display   Initial Impression / Assessment and Plan / ED Course  I have reviewed the triage vital signs and the nursing notes.  Pertinent labs & imaging results that were available during my care of the patient were reviewed by me and considered in my medical decision making (see chart for details).  Clinical Course   Patient be treated for multiple contusions and cervical strain.  Told to return here as needed.  Patient agrees the plan and all questions were answered.  Did ask her to follow-up with her  primary care doctor.  Ice and elevate the areas that are sore  Final Clinical Impressions(s) / ED Diagnoses   Final diagnoses:  None    New Prescriptions New Prescriptions   No medications on file  I personally performed the services described in this documentation, which was scribed in my presence. The recorded information has been reviewed and is accurate.    Charlestine Night, PA-C 07/18/16 2149    Canary Brim Tegeler, MD 07/19/16 [phone]

## 2016-07-18 NOTE — ED Notes (Addendum)
Pt reports falling today after tripping over a concrete slab.  Pt denies LOC or hitting her head.  Pt has a small abrasion to the right knee.  Pt reports pain on the whole left leg.  Pt reports being able to walk and bear weight on leg. ABCD's intact.

## 2016-08-15 ENCOUNTER — Ambulatory Visit (HOSPITAL_BASED_OUTPATIENT_CLINIC_OR_DEPARTMENT_OTHER): Payer: BLUE CROSS/BLUE SHIELD | Admitting: Physical Medicine & Rehabilitation

## 2016-08-15 ENCOUNTER — Encounter: Payer: BLUE CROSS/BLUE SHIELD | Attending: Physical Medicine & Rehabilitation

## 2016-08-15 ENCOUNTER — Encounter: Payer: Self-pay | Admitting: Physical Medicine & Rehabilitation

## 2016-08-15 DIAGNOSIS — M545 Low back pain, unspecified: Secondary | ICD-10-CM | POA: Insufficient documentation

## 2016-08-15 DIAGNOSIS — M5136 Other intervertebral disc degeneration, lumbar region: Secondary | ICD-10-CM | POA: Diagnosis not present

## 2016-08-15 DIAGNOSIS — M1288 Other specific arthropathies, not elsewhere classified, other specified site: Secondary | ICD-10-CM | POA: Diagnosis not present

## 2016-08-15 DIAGNOSIS — E1140 Type 2 diabetes mellitus with diabetic neuropathy, unspecified: Secondary | ICD-10-CM | POA: Insufficient documentation

## 2016-08-15 DIAGNOSIS — E1142 Type 2 diabetes mellitus with diabetic polyneuropathy: Secondary | ICD-10-CM | POA: Insufficient documentation

## 2016-08-15 DIAGNOSIS — R200 Anesthesia of skin: Secondary | ICD-10-CM | POA: Insufficient documentation

## 2016-08-15 DIAGNOSIS — G8929 Other chronic pain: Secondary | ICD-10-CM | POA: Insufficient documentation

## 2016-08-15 DIAGNOSIS — Z5181 Encounter for therapeutic drug level monitoring: Secondary | ICD-10-CM

## 2016-08-15 DIAGNOSIS — Z79899 Other long term (current) drug therapy: Secondary | ICD-10-CM

## 2016-08-15 DIAGNOSIS — M47816 Spondylosis without myelopathy or radiculopathy, lumbar region: Secondary | ICD-10-CM

## 2016-08-15 MED ORDER — GABAPENTIN 400 MG PO CAPS: 400.0000 mg | ORAL_CAPSULE | Freq: Three times a day (TID) | ORAL | 1 refills | Status: DC

## 2016-08-15 MED ORDER — DIAZEPAM 10 MG PO TABS: 10.0000 mg | ORAL_TABLET | Freq: Once | ORAL | 0 refills | Status: AC

## 2016-08-15 NOTE — Addendum Note (Signed)
Addended by: Angela NevinWESSLING, Micky Sheller D on: 08/15/2016 03:19 PM   Modules accepted: Orders
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
## 2016-08-15 NOTE — Patient Instructions (Signed)
Will need to review records from prior physician office  Need urine drug screen prior to prescribing any narcotic analgesics.  There is a medication that may help both with you neuropathy pain, and your back pain called Nucynta  This can substitute for both the gabapentin and oxycodone

## 2016-08-19 ENCOUNTER — Other Ambulatory Visit: Payer: Self-pay | Admitting: *Deleted

## 2016-08-19 MED ORDER — GABAPENTIN 400 MG PO CAPS: 400.0000 mg | ORAL_CAPSULE | Freq: Three times a day (TID) | ORAL | 1 refills | Status: DC

## 2016-08-23 LAB — TOXASSURE SELECT,+ANTIDEPR,UR

## 2016-08-26 NOTE — Progress Notes (Signed)
Urine drug screen for this encounter is consistent for prescribed medication Tramadol was detected, last prescription was 1 year ago.

## 2016-09-01 ENCOUNTER — Ambulatory Visit: Payer: Self-pay | Admitting: Physical Medicine & Rehabilitation

## 2016-09-04 ENCOUNTER — Ambulatory Visit: Payer: Self-pay | Admitting: Physical Medicine & Rehabilitation

## 2016-11-13 ENCOUNTER — Encounter (HOSPITAL_COMMUNITY): Payer: Self-pay | Admitting: Emergency Medicine

## 2016-11-13 ENCOUNTER — Emergency Department (HOSPITAL_COMMUNITY): Payer: BLUE CROSS/BLUE SHIELD

## 2016-11-13 ENCOUNTER — Observation Stay (HOSPITAL_COMMUNITY): Payer: BLUE CROSS/BLUE SHIELD

## 2016-11-13 ENCOUNTER — Inpatient Hospital Stay (HOSPITAL_COMMUNITY)
Admission: EM | Admit: 2016-11-13 | Discharge: 2016-11-17 | DRG: 193 | Disposition: A | Discharge status: Home / Self Care | Payer: BLUE CROSS/BLUE SHIELD | Attending: Internal Medicine | Admitting: Internal Medicine

## 2016-11-13 DIAGNOSIS — R05 Cough: Secondary | ICD-10-CM

## 2016-11-13 DIAGNOSIS — E8770 Fluid overload, unspecified: Secondary | ICD-10-CM | POA: Diagnosis present

## 2016-11-13 DIAGNOSIS — R059 Cough, unspecified: Secondary | ICD-10-CM

## 2016-11-13 DIAGNOSIS — J9601 Acute respiratory failure with hypoxia: Secondary | ICD-10-CM

## 2016-11-13 DIAGNOSIS — T50995A Adverse effect of other drugs, medicaments and biological substances, initial encounter: Secondary | ICD-10-CM | POA: Diagnosis present

## 2016-11-13 DIAGNOSIS — Z006 Encounter for examination for normal comparison and control in clinical research program: Secondary | ICD-10-CM

## 2016-11-13 DIAGNOSIS — R0902 Hypoxemia: Secondary | ICD-10-CM | POA: Diagnosis present

## 2016-11-13 DIAGNOSIS — E1142 Type 2 diabetes mellitus with diabetic polyneuropathy: Secondary | ICD-10-CM | POA: Diagnosis present

## 2016-11-13 DIAGNOSIS — J101 Influenza due to other identified influenza virus with other respiratory manifestations: Secondary | ICD-10-CM

## 2016-11-13 DIAGNOSIS — E1169 Type 2 diabetes mellitus with other specified complication: Secondary | ICD-10-CM

## 2016-11-13 DIAGNOSIS — F329 Major depressive disorder, single episode, unspecified: Secondary | ICD-10-CM | POA: Diagnosis present

## 2016-11-13 DIAGNOSIS — F418 Other specified anxiety disorders: Secondary | ICD-10-CM

## 2016-11-13 DIAGNOSIS — R0602 Shortness of breath: Secondary | ICD-10-CM

## 2016-11-13 DIAGNOSIS — M519 Unspecified thoracic, thoracolumbar and lumbosacral intervertebral disc disorder: Secondary | ICD-10-CM | POA: Diagnosis present

## 2016-11-13 DIAGNOSIS — J09X2 Influenza due to identified novel influenza A virus with other respiratory manifestations: Secondary | ICD-10-CM | POA: Diagnosis not present

## 2016-11-13 DIAGNOSIS — Z794 Long term (current) use of insulin: Secondary | ICD-10-CM

## 2016-11-13 DIAGNOSIS — I252 Old myocardial infarction: Secondary | ICD-10-CM

## 2016-11-13 DIAGNOSIS — Z8249 Family history of ischemic heart disease and other diseases of the circulatory system: Secondary | ICD-10-CM

## 2016-11-13 DIAGNOSIS — G4733 Obstructive sleep apnea (adult) (pediatric): Secondary | ICD-10-CM | POA: Diagnosis present

## 2016-11-13 DIAGNOSIS — Z79899 Other long term (current) drug therapy: Secondary | ICD-10-CM

## 2016-11-13 DIAGNOSIS — J111 Influenza due to unidentified influenza virus with other respiratory manifestations: Secondary | ICD-10-CM

## 2016-11-13 DIAGNOSIS — E041 Nontoxic single thyroid nodule: Secondary | ICD-10-CM | POA: Diagnosis not present

## 2016-11-13 DIAGNOSIS — D649 Anemia, unspecified: Secondary | ICD-10-CM | POA: Diagnosis present

## 2016-11-13 DIAGNOSIS — Z833 Family history of diabetes mellitus: Secondary | ICD-10-CM

## 2016-11-13 DIAGNOSIS — E871 Hypo-osmolality and hyponatremia: Secondary | ICD-10-CM | POA: Diagnosis not present

## 2016-11-13 DIAGNOSIS — Z825 Family history of asthma and other chronic lower respiratory diseases: Secondary | ICD-10-CM

## 2016-11-13 DIAGNOSIS — F419 Anxiety disorder, unspecified: Secondary | ICD-10-CM

## 2016-11-13 DIAGNOSIS — R51 Headache: Secondary | ICD-10-CM | POA: Diagnosis present

## 2016-11-13 DIAGNOSIS — Z6841 Body Mass Index (BMI) 40.0 and over, adult: Secondary | ICD-10-CM

## 2016-11-13 DIAGNOSIS — E669 Obesity, unspecified: Secondary | ICD-10-CM | POA: Diagnosis not present

## 2016-11-13 DIAGNOSIS — I472 Ventricular tachycardia: Secondary | ICD-10-CM | POA: Diagnosis not present

## 2016-11-13 DIAGNOSIS — R6510 Systemic inflammatory response syndrome (SIRS) of non-infectious origin without acute organ dysfunction: Secondary | ICD-10-CM

## 2016-11-13 DIAGNOSIS — Z87891 Personal history of nicotine dependence: Secondary | ICD-10-CM

## 2016-11-13 DIAGNOSIS — E1140 Type 2 diabetes mellitus with diabetic neuropathy, unspecified: Secondary | ICD-10-CM | POA: Diagnosis present

## 2016-11-13 DIAGNOSIS — D702 Other drug-induced agranulocytosis: Secondary | ICD-10-CM | POA: Diagnosis present

## 2016-11-13 DIAGNOSIS — R519 Headache, unspecified: Secondary | ICD-10-CM

## 2016-11-13 DIAGNOSIS — E119 Type 2 diabetes mellitus without complications: Secondary | ICD-10-CM

## 2016-11-13 DIAGNOSIS — J9691 Respiratory failure, unspecified with hypoxia: Secondary | ICD-10-CM | POA: Diagnosis present

## 2016-11-13 DIAGNOSIS — Z9049 Acquired absence of other specified parts of digestive tract: Secondary | ICD-10-CM

## 2016-11-13 DIAGNOSIS — K219 Gastro-esophageal reflux disease without esophagitis: Secondary | ICD-10-CM | POA: Diagnosis present

## 2016-11-13 HISTORY — DX: Obstructive sleep apnea (adult) (pediatric): G47.33

## 2016-11-13 HISTORY — DX: Polyneuropathy, unspecified: G62.9

## 2016-11-13 LAB — BASIC METABOLIC PANEL
Anion gap: 10 (ref 5–15)
BUN: 6 mg/dL (ref 6–20)
CO2: 25 mmol/L (ref 22–32)
Calcium: 8.7 mg/dL — ABNORMAL LOW (ref 8.9–10.3)
Chloride: 98 mmol/L — ABNORMAL LOW (ref 101–111)
Creatinine, Ser: 0.84 mg/dL (ref 0.44–1.00)
GFR calc Af Amer: >60 mL/min (ref >60)
GLUCOSE: 289 mg/dL — AB (ref 65–99)
POTASSIUM: 4.3 mmol/L (ref 3.5–5.1)
Sodium: 133 mmol/L — ABNORMAL LOW (ref 135–145)

## 2016-11-13 LAB — CBC
HEMATOCRIT: 41.5 % (ref 36.0–46.0)
Hemoglobin: 14.1 g/dL (ref 12.0–15.0)
MCH: 29.6 pg (ref 26.0–34.0)
MCHC: 34 g/dL (ref 30.0–36.0)
MCV: 87.2 fL (ref 78.0–100.0)
Platelets: 196 10*3/uL (ref 150–400)
RBC: 4.76 MIL/uL (ref 3.87–5.11)
RDW: 12.5 % (ref 11.5–15.5)
WBC: 8.1 10*3/uL (ref 4.0–10.5)

## 2016-11-13 LAB — GLUCOSE, CAPILLARY: Glucose-Capillary: 233 mg/dL — ABNORMAL HIGH (ref 65–99)

## 2016-11-13 LAB — INFLUENZA PANEL BY PCR (TYPE A & B)
Influenza A By PCR: POSITIVE — AB
Influenza B By PCR: NEGATIVE

## 2016-11-13 LAB — CBG MONITORING, ED: Glucose-Capillary: 282 mg/dL — ABNORMAL HIGH (ref 65–99)

## 2016-11-13 LAB — I-STAT CG4 LACTIC ACID, ED: Lactic Acid, Venous: 1.71 mmol/L (ref 0.5–1.9)

## 2016-11-13 MED ORDER — SENNOSIDES-DOCUSATE SODIUM 8.6-50 MG PO TABS: 1.0000 | ORAL_TABLET | Freq: Every evening | ORAL | Status: DC | PRN

## 2016-11-13 MED ORDER — BISACODYL 10 MG RE SUPP
10.0000 mg | Freq: Every day | RECTAL | Status: DC | PRN
Start: 2016-11-13 — End: 2016-11-17

## 2016-11-13 MED ORDER — OSELTAMIVIR PHOSPHATE 75 MG PO CAPS
75.0000 mg | ORAL_CAPSULE | Freq: Two times a day (BID) | ORAL | Status: DC
  Administered 2016-11-13 – 2016-11-17 (×8): 75 mg via ORAL
  Filled 2016-11-13 (×9): qty 1

## 2016-11-13 MED ORDER — HYDROCOD POLST-CPM POLST ER 10-8 MG/5ML PO SUER
5.0000 mL | Freq: Once | ORAL | Status: AC
  Administered 2016-11-13: 5 mL via ORAL
  Filled 2016-11-13: qty 5

## 2016-11-13 MED ORDER — FLUTICASONE FUROATE-VILANTEROL 100-25 MCG/INH IN AEPB
1.0000 | INHALATION_SPRAY | Freq: Every day | RESPIRATORY_TRACT | Status: DC
Start: 2016-11-14 — End: 2016-11-17
  Administered 2016-11-14 – 2016-11-17 (×4): 1 via RESPIRATORY_TRACT
  Filled 2016-11-13: qty 28

## 2016-11-13 MED ORDER — IPRATROPIUM BROMIDE 0.02 % IN SOLN
0.5000 mg | Freq: Once | RESPIRATORY_TRACT | Status: AC
  Administered 2016-11-13: 0.5 mg via RESPIRATORY_TRACT
  Filled 2016-11-13: qty 2.5

## 2016-11-13 MED ORDER — ENOXAPARIN SODIUM 60 MG/0.6ML ~~LOC~~ SOLN
0.5000 mg/kg | SUBCUTANEOUS | Status: DC
  Administered 2016-11-13 – 2016-11-16 (×4): 60 mg via SUBCUTANEOUS
  Filled 2016-11-13 (×4): qty 0.6

## 2016-11-13 MED ORDER — TRAMADOL HCL 50 MG PO TABS
50.0000 mg | ORAL_TABLET | Freq: Four times a day (QID) | ORAL | Status: DC | PRN
  Administered 2016-11-13 – 2016-11-16 (×7): 50 mg via ORAL
  Filled 2016-11-13 (×7): qty 1

## 2016-11-13 MED ORDER — ONDANSETRON HCL 4 MG PO TABS: 4.0000 mg | ORAL_TABLET | Freq: Four times a day (QID) | ORAL | Status: DC | PRN

## 2016-11-13 MED ORDER — CITALOPRAM HYDROBROMIDE 20 MG PO TABS
40.0000 mg | ORAL_TABLET | Freq: Every day | ORAL | Status: DC
Start: 2016-11-13 — End: 2016-11-17
  Administered 2016-11-13 – 2016-11-17 (×5): 40 mg via ORAL
  Filled 2016-11-13 (×4): qty 2

## 2016-11-13 MED ORDER — ACETAMINOPHEN 325 MG PO TABS
650.0000 mg | ORAL_TABLET | Freq: Four times a day (QID) | ORAL | Status: DC | PRN
  Administered 2016-11-13 – 2016-11-16 (×9): 650 mg via ORAL
  Filled 2016-11-13 (×9): qty 2

## 2016-11-13 MED ORDER — ONDANSETRON HCL 4 MG/2ML IJ SOLN
4.0000 mg | Freq: Four times a day (QID) | INTRAMUSCULAR | Status: DC | PRN
  Administered 2016-11-14: 4 mg via INTRAVENOUS
  Filled 2016-11-13: qty 2

## 2016-11-13 MED ORDER — IOPAMIDOL (ISOVUE-370) INJECTION 76%
INTRAVENOUS | Status: AC
  Filled 2016-11-13: qty 100

## 2016-11-13 MED ORDER — ACETAMINOPHEN 650 MG RE SUPP: 650.0000 mg | Freq: Four times a day (QID) | RECTAL | Status: DC | PRN

## 2016-11-13 MED ORDER — ALBUTEROL SULFATE (2.5 MG/3ML) 0.083% IN NEBU
5.0000 mg | INHALATION_SOLUTION | Freq: Once | RESPIRATORY_TRACT | Status: AC
  Administered 2016-11-13: 5 mg via RESPIRATORY_TRACT
  Filled 2016-11-13: qty 6

## 2016-11-13 MED ORDER — IOPAMIDOL (ISOVUE-370) INJECTION 76%
100.0000 mL | Freq: Once | INTRAVENOUS | Status: AC | PRN
Start: 2016-11-13 — End: 2016-11-13
  Administered 2016-11-13: 100 mL via INTRAVENOUS

## 2016-11-13 MED ORDER — INSULIN ASPART 100 UNIT/ML ~~LOC~~ SOLN
0.0000 [IU] | Freq: Three times a day (TID) | SUBCUTANEOUS | Status: DC
  Administered 2016-11-14: 11 [IU] via SUBCUTANEOUS
  Administered 2016-11-14 – 2016-11-15 (×3): 3 [IU] via SUBCUTANEOUS
  Administered 2016-11-15: 5 [IU] via SUBCUTANEOUS
  Administered 2016-11-15: 3 [IU] via SUBCUTANEOUS
  Administered 2016-11-16: 2 [IU] via SUBCUTANEOUS
  Administered 2016-11-16: 5 [IU] via SUBCUTANEOUS
  Administered 2016-11-16: 2 [IU] via SUBCUTANEOUS
  Administered 2016-11-17: 3 [IU] via SUBCUTANEOUS

## 2016-11-13 MED ORDER — ACETAMINOPHEN 325 MG PO TABS
650.0000 mg | ORAL_TABLET | Freq: Once | ORAL | Status: AC
  Administered 2016-11-13: 650 mg via ORAL
  Filled 2016-11-13: qty 2

## 2016-11-13 MED ORDER — SODIUM CHLORIDE 0.9% FLUSH
3.0000 mL | Freq: Two times a day (BID) | INTRAVENOUS | Status: DC
  Administered 2016-11-13 – 2016-11-16 (×4): 3 mL via INTRAVENOUS

## 2016-11-13 MED ORDER — SODIUM CHLORIDE 0.9 % IV BOLUS (SEPSIS)
1000.0000 mL | Freq: Once | INTRAVENOUS | Status: AC
  Administered 2016-11-13: 1000 mL via INTRAVENOUS

## 2016-11-13 MED ORDER — CLONAZEPAM 1 MG PO TABS
1.0000 mg | ORAL_TABLET | Freq: Two times a day (BID) | ORAL | Status: DC | PRN
  Administered 2016-11-14 – 2016-11-16 (×4): 1 mg via ORAL
  Filled 2016-11-13 (×4): qty 1

## 2016-11-13 MED ORDER — GABAPENTIN 400 MG PO CAPS
400.0000 mg | ORAL_CAPSULE | Freq: Three times a day (TID) | ORAL | Status: DC
Start: 2016-11-13 — End: 2016-11-17
  Administered 2016-11-13 – 2016-11-17 (×12): 400 mg via ORAL
  Filled 2016-11-13 (×12): qty 1

## 2016-11-13 MED ORDER — KETOROLAC TROMETHAMINE 30 MG/ML IJ SOLN
30.0000 mg | Freq: Once | INTRAMUSCULAR | Status: AC
  Administered 2016-11-13: 30 mg via INTRAVENOUS
  Filled 2016-11-13: qty 1

## 2016-11-13 MED ORDER — CYCLOBENZAPRINE HCL 10 MG PO TABS
10.0000 mg | ORAL_TABLET | Freq: Three times a day (TID) | ORAL | Status: DC | PRN
  Administered 2016-11-13 – 2016-11-16 (×3): 10 mg via ORAL
  Filled 2016-11-13 (×3): qty 1

## 2016-11-13 MED ORDER — IPRATROPIUM-ALBUTEROL 0.5-2.5 (3) MG/3ML IN SOLN
3.0000 mL | Freq: Four times a day (QID) | RESPIRATORY_TRACT | Status: DC
  Administered 2016-11-13 – 2016-11-14 (×2): 3 mL via RESPIRATORY_TRACT
  Filled 2016-11-13 (×2): qty 3

## 2016-11-13 MED ORDER — INSULIN GLARGINE 100 UNIT/ML ~~LOC~~ SOLN
20.0000 [IU] | Freq: Every day | SUBCUTANEOUS | Status: DC
  Administered 2016-11-13: 20 [IU] via SUBCUTANEOUS
  Filled 2016-11-13 (×2): qty 0.2

## 2016-11-13 MED ORDER — SODIUM CHLORIDE 0.9 % IV SOLN
INTRAVENOUS | Status: DC
  Administered 2016-11-13 – 2016-11-16 (×5): via INTRAVENOUS

## 2016-11-13 NOTE — ED Notes (Signed)
Respiratory made aware of Neb treatment ordered.

## 2016-11-13 NOTE — ED Provider Notes (Signed)
[facility]-EMERGENCY DEPT Provider Note   CSN: [id number] Arrival date & time: 11/13/16  1059     History   Chief Complaint Chief Complaint  Patient presents with  . Cough  . Muscle Pain    HPI Veronica [name] is a 56 y.o. female.  HPI Patient presents to the emergency department with a 2 day history of cough with body aches, sore throat, nasal congestion with nausea.  The patient states that she took over-the-counter cough, cold medication without significant relief of her symptoms.  Patient states that she has had fever as well.  Patient states that she did not take any other medications prior to arrival. The patient denies chest pain, shortness of breath, headache,blurred vision, neck pain, weakness, numbness, dizziness, anorexia, edema, abdominal pain, nausea, vomiting, diarrhea, rash, back pain, dysuria, hematemesis, bloody stool, near syncope, or syncope. Past Medical History:  Diagnosis Date  . Cough   . Diabetes mellitus   . H/O diverticulitis of colon   . Heart attack 08/2009  . Sciatica     Patient Active Problem List   Diagnosis Date Noted  . Diabetic polyneuropathy associated with type 2 diabetes mellitus (HCC) 08/15/2016  . Chronic midline low back pain 08/15/2016  . Lumbar facet arthropathy 08/15/2016  . Cough 08/15/2015  . OSA (obstructive sleep apnea) 08/15/2015  . Sciatica 08/15/2015  . Diabetes mellitus (HCC) 08/15/2015  . Hiatal hernia 08/15/2015  . GERD (gastroesophageal reflux disease) 08/15/2015    Past Surgical History:  Procedure Laterality Date  . NASAL RECONSTRUCTION    . PARTIAL COLECTOMY    . TONSILLECTOMY AND ADENOIDECTOMY    . UVULOPALATOPHARYNGOPLASTY      OB History    No data available       Home Medications    Prior to Admission medications   Medication Sig Start Date End Date Taking? Authorizing Provider  citalopram (CELEXA) 40 MG tablet Take 40 mg by mouth daily.  10/01/16  Yes Historical Provider, MD  clonazePAM (KLONOPIN) 1  MG tablet Take 1 mg by mouth 2 (two) times daily as needed for anxiety (sleep).  10/01/16  Yes Historical Provider, MD  cyclobenzaprine (FLEXERIL) 10 MG tablet Take 1 tablet (10 mg total) by mouth 3 (three) times daily as needed for muscle spasms. 06/10/13  Yes Harjot Zavadil, PA-C  Fluticasone Furoate-Vilanterol (BREO ELLIPTA) 100-25 MCG/INH AEPB Inhale 1 puff into the lungs daily.   Yes Historical Provider, MD  gabapentin (NEURONTIN) 400 MG capsule Take 1 capsule (400 mg total) by mouth 3 (three) times daily. 08/19/16  Yes Erick Colace, MD  hydrochlorothiazide (HYDRODIURIL) 12.5 MG tablet Take 12.5 mg by mouth daily.  07/08/16  Yes Historical Provider, MD  insulin NPH (HUMULIN N,NOVOLIN N) 100 UNIT/ML injection Inject 30 Units into the skin 2 (two) times daily before a meal.    Yes Historical Provider, MD  saxagliptin HCl (ONGLYZA) 5 MG TABS tablet Take 5 mg by mouth daily.  07/08/16  Yes Historical Provider, MD  acetaminophen-codeine (TYLENOL #3) 300-30 MG tablet Take 1 tablet by mouth every 4 (four) hours as needed for moderate pain.    Historical Provider, MD  HYDROcodone-acetaminophen (NORCO/VICODIN) 5-325 MG tablet Take 1 tablet by mouth every 6 (six) hours as needed for moderate pain. Patient not taking: Reported on 08/15/2016 07/18/16   Charlestine Night, PA-C  ibuprofen (ADVIL,MOTRIN) 800 MG tablet Take 1 tablet (800 mg total) by mouth every 8 (eight) hours as needed. Patient not taking: Reported on 11/13/2016 07/18/16   Cristal Deer  Wildon Cuevas, PA-C  ondansetron (ZOFRAN-ODT) 8 MG disintegrating tablet Take 1 tablet (8 mg total) by mouth every 8 (eight) hours as needed for nausea. Patient not taking: Reported on 11/13/2016 07/24/15   Elvina Sidle, MD    Family History Family History  Problem Relation Age of Onset  . Diabetes Mother     type 2  . Diabetes      cousin type 1  . Diabetes Brother   . Congestive Heart Failure Brother   . Emphysema Paternal Grandfather   . Tuberculosis  Paternal Grandfather     Social History Social History  Substance Use Topics  . Smoking status: Former Smoker    Years: 4.00    Quit date: 10/28/1987  . Smokeless tobacco: Never Used     Comment: 1 pack per 2 months  . Alcohol use No     Allergies   Patient has no known allergies.   Review of Systems Review of Systems All other systems negative except as documented in the HPI. All pertinent positives and negatives as reviewed in the HPI.  Physical Exam Updated Vital Signs BP (!) 123/53 (BP Location: Left Arm)   Pulse 104   Temp 100 F (37.8 C) (Oral)   Resp 18   Wt 116.6 kg   SpO2 96%   BMI 42.77 kg/m   Physical Exam  Constitutional: She is oriented to person, place, and time. She appears well-developed and well-nourished. No distress.  HENT:  Head: Normocephalic and atraumatic.  Mouth/Throat: Oropharynx is clear and moist.  Eyes: Pupils are equal, round, and reactive to light.  Neck: Normal range of motion. Neck supple.  Cardiovascular: Normal rate, regular rhythm and normal heart sounds.  Exam reveals no gallop and no friction rub.   No murmur heard. Pulmonary/Chest: Effort normal and breath sounds normal. No respiratory distress. She has no wheezes.  Abdominal: Soft. Bowel sounds are normal. She exhibits no distension. There is no tenderness.  Neurological: She is alert and oriented to person, place, and time. She exhibits normal muscle tone. Coordination normal.  Skin: Skin is warm and dry. No rash noted. No erythema.  Psychiatric: She has a normal mood and affect. Her behavior is normal.  Nursing note and vitals reviewed.    ED Treatments / Results  Labs (all labs ordered are listed, but only abnormal results are displayed) Labs Reviewed  BASIC METABOLIC PANEL - Abnormal; Notable for the following:       Result Value   Sodium 133 (*)    Chloride 98 (*)    Glucose, Bld 289 (*)    Calcium 8.7 (*)    All other components within normal limits  CBG  MONITORING, ED - Abnormal; Notable for the following:    Glucose-Capillary 282 (*)    All other components within normal limits  CBC  URINALYSIS, ROUTINE W REFLEX MICROSCOPIC  INFLUENZA PANEL BY PCR (TYPE A & B)  I-STAT CG4 LACTIC ACID, ED    EKG  EKG Interpretation None       Radiology Dg Chest 2 View  Result Date: 11/13/2016 CLINICAL DATA:  Productive cough for 2 days. EXAM: CHEST  2 VIEW COMPARISON:  08/24/2015 FINDINGS: The heart size and mediastinal contours are within normal limits. Both lungs are clear. The visualized skeletal structures are unremarkable. IMPRESSION: No active cardiopulmonary disease. Electronically Signed   By: Signa Kell M.D.   On: 11/13/2016 11:33   Ct Angio Chest Pe W Or Wo Contrast  Result Date: 11/13/2016 CLINICAL  DATA:  Productive cough for 2 days. Chest pain with coughing. EXAM: CT ANGIOGRAPHY CHEST WITH CONTRAST TECHNIQUE: Multidetector CT imaging of the chest was performed using the standard protocol during bolus administration of intravenous contrast. Multiplanar CT image reconstructions and MIPs were obtained to evaluate the vascular anatomy. CONTRAST:  100 mL of Isovue 370 COMPARISON:  None. FINDINGS: Cardiovascular: The heart size is unchanged and unremarkable. The thoracic aorta is non aneurysmal with no dissection. Evaluation of pulmonary arteries is limited due to mild respiratory motion, artifact, and timing of contrast. Within this limitation, no pulmonary emboli identified. Mediastinum/Nodes: The 1.7 cm nodule in the right thyroid lobe is unchanged by my measurement. No effusions. The esophagus is normal. No adenopathy. Lungs/Pleura: Scattered subsegmental atelectasis is identified with no suspicious nodules, masses, or focal infiltrates. The central airways are normal. No pneumothorax. Upper Abdomen: No acute abnormality. Musculoskeletal: No chest wall abnormality. No acute or significant osseous findings. Review of the MIP images confirms the  above findings. IMPRESSION: 1. Evaluation for pulmonary emboli is limited due to artifact and respiratory motion but no convincing pulmonary emboli are identified. 2. Stable 1.6 cm nodule in the right thyroid lobe. An ultrasound could better evaluate. Electronically Signed   By: Gerome Samavid  Williams III M.D   On: 11/13/2016 16:04    Procedures Procedures (including critical care time)  Medications Ordered in ED Medications  iopamidol (ISOVUE-370) 76 % injection (not administered)  acetaminophen (TYLENOL) tablet 650 mg (650 mg Oral Given 11/13/16 1353)  sodium chloride 0.9 % bolus 1,000 mL (0 mLs Intravenous Stopped 11/13/16 1637)  ketorolac (TORADOL) 30 MG/ML injection 30 mg (30 mg Intravenous Given 11/13/16 1427)  chlorpheniramine-HYDROcodone (TUSSIONEX) 10-8 MG/5ML suspension 5 mL (5 mLs Oral Given 11/13/16 1426)  albuterol (PROVENTIL) (2.5 MG/3ML) 0.083% nebulizer solution 5 mg (5 mg Nebulization Given 11/13/16 1409)  ipratropium (ATROVENT) nebulizer solution 0.5 mg (0.5 mg Nebulization Given 11/13/16 1409)  iopamidol (ISOVUE-370) 76 % injection 100 mL (100 mLs Intravenous Contrast Given 11/13/16 1539)     Initial Impression / Assessment and Plan / ED Course  I have reviewed the triage vital signs and the nursing notes.  Pertinent labs & imaging results that were available during my care of the patient were reviewed by me and considered in my medical decision making (see chart for details).    Patient will be admitted to the hospital due to the fact that she has hypoxia went off the oxygen.  The patient dropped to the low to mid 80s when taken of the accident and ambulate.  Patient does not normally require oxygen at home.  She does not have any significant wheezing on exam.  CT scan of her chest was performed to rule out any further etiology for this hypoxia.  This is most likely related to influenza   Final Clinical Impressions(s) / ED Diagnoses   Final diagnoses:  Cough    New  Prescriptions New Prescriptions   No medications on file     Charlestine NightChristopher Arvin Abello, PA-C 11/15/16 1609    Derwood KaplanAnkit Nanavati, MD 11/16/16 1610

## 2016-11-13 NOTE — ED Notes (Signed)
Upon arrival to room pt went to restroom, hat was placed in toilet, pt voided around hat. Will attempt UA again

## 2016-11-13 NOTE — ED Notes (Signed)
Called 5th floor, was told Rn unavailable to take report at this time.  Awaiting a call back

## 2016-11-13 NOTE — ED Triage Notes (Signed)
Pt stated that she has a productive cough x 2 days.C/o increased aching over whole body. )2 sat 90-91%. Pt continues to talk while on monitor. Cough, nonproductive at present, decreased breath sounds in bilateral bases. Pt reports back and chest pain when she coughs. Elevelated CBG noted by pt and EMS. Pt is alert and cooperative. Husband with pt.

## 2016-11-13 NOTE — H&P (Addendum)
History and Physical    Veronica [name] ONG:[id number] DOB: 08/12/1961 DOA: 11/13/2016  PCP: Bertram Savinouillard, Jennifer L, PA-C   Patient coming from: Home  Chief Complaint: Body Aches and Cough  HPI: Veronica [name] is a 56 y.o. 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 Cough has progressively gotten worse and is productive and cloudy/clear. Cough has increased since Tuesday and patient was going to come to ED yesterday but could not because of Snow. Has not had an appetitie but has been trying to rehydrate with po fluids. Patient states she has had headaches as well. Been around sick contact and was around them on Tuesday. She feels like she has been wheezing and has developed pain in chest from coughing. She has felt nauseous but has not vomited and this AM she developed a headache. States myalgias got so bad that she came in for evaluation. She presented to the ED and was found to be hypoxic on room air. TRH was asked to see patient and admit for Hypoxia and it was found that she was positive for Influenza A.   ED Course: Had CT Angio of Chest and CXR done to evaluate her Hypoxia; Had basic blood work done; Was found to have Influenza A positive.   Review of Systems: As per HPI otherwise 10 point review of systems negative. Patient complains of Neuropathy in her feet and complains of Right foot electrical sensations. Denies Constipation or Diarrhea.   Past Medical History:  Diagnosis Date  . Cough   . Diabetes mellitus   . H/O diverticulitis of colon   . Heart attack 08/2009  . Sciatica    Past Surgical  History:  Procedure Laterality Date  . NASAL RECONSTRUCTION    . PARTIAL COLECTOMY    . TONSILLECTOMY AND ADENOIDECTOMY    . UVULOPALATOPHARYNGOPLASTY     SOCIAL HISTORY  reports that she quit smoking about 29 years ago. She quit after 4.00 years of use. She has never used smokeless tobacco. She reports that she does not drink alcohol or use drugs.  No Known Allergies  Family History  Problem Relation Age of Onset  . Diabetes Mother     type 2  . Diabetes      cousin type 1  . Diabetes Brother   . Congestive Heart Failure Brother   . Emphysema Paternal Grandfather   . Tuberculosis Paternal Grandfather     Prior to Admission medications   Medication Sig Start Date End Date Taking? Authorizing Provider  citalopram (CELEXA) 40 MG tablet Take 40 mg by mouth daily.  10/01/16  Yes Historical Provider, MD  clonazePAM (KLONOPIN) 1 MG tablet Take 1 mg by mouth 2 (two) times daily as needed for anxiety (sleep).  10/01/16  Yes Historical Provider, MD  cyclobenzaprine (FLEXERIL) 10 MG tablet Take 1 tablet (10 mg total) by mouth 3 (three) times daily as needed for muscle spasms. 06/10/13  Yes Christopher Lawyer, PA-C  Fluticasone Furoate-Vilanterol (BREO ELLIPTA) 100-25 MCG/INH AEPB Inhale 1 puff into the lungs daily.   Yes Historical Provider, MD  gabapentin (NEURONTIN) 400 MG capsule Take 1 capsule (400 mg total) by  mouth 3 (three) times daily. 08/19/16  Yes Erick Colace, MD  hydrochlorothiazide (HYDRODIURIL) 12.5 MG tablet Take 12.5 mg by mouth daily.  07/08/16  Yes Historical Provider, MD  insulin NPH (HUMULIN N,NOVOLIN N) 100 UNIT/ML injection Inject 30 Units into the skin 2 (two) times daily before a meal.    Yes Historical Provider, MD  saxagliptin HCl (ONGLYZA) 5 MG TABS tablet Take 5 mg by mouth daily.  07/08/16  Yes Historical Provider, MD  acetaminophen-codeine (TYLENOL #3) 300-30 MG tablet Take 1 tablet by mouth every 4 (four) hours as needed for moderate pain.    Historical  Provider, MD  HYDROcodone-acetaminophen (NORCO/VICODIN) 5-325 MG tablet Take 1 tablet by mouth every 6 (six) hours as needed for moderate pain. Patient not taking: Reported on 08/15/2016 07/18/16   Charlestine Night, PA-C  ibuprofen (ADVIL,MOTRIN) 800 MG tablet Take 1 tablet (800 mg total) by mouth every 8 (eight) hours as needed. Patient not taking: Reported on 11/13/2016 07/18/16   Charlestine Night, PA-C  ondansetron (ZOFRAN-ODT) 8 MG disintegrating tablet Take 1 tablet (8 mg total) by mouth every 8 (eight) hours as needed for nausea. Patient not taking: Reported on 11/13/2016 07/24/15   Elvina Sidle, MD   Physical Exam: Vitals:   11/13/16 1516 11/13/16 1553 11/13/16 1555 11/13/16 1748  BP: (!) 106/49 (!) 123/53  106/55  Pulse: 106 104 104 93  Resp: 16 18  14   Temp:  100 F (37.8 C)    TempSrc:  Oral    SpO2: 95% (!) 88% 96% 96%  Weight:       Constitutional: WN/WD,obese AAF who is in NAD but appears uncomfortable Eyes: Lids and conjunctivae normal, sclerae anicteric  ENMT: External Ears, Nose appear normal. Grossly normal hearing. Mucous membranes slightly dry. Neck: Appears normal, supple, no visible cervical masses, normal ROM, no appreciable thyromegaly; Unable to palpate thyroid nodule Respiratory: Diminished but clear to auscultation bilaterally, no wheezing, rales, rhonchi or crackles. Slightly increased respiratory effort but No accessory muscle use. Wearing supplemental O2 via [place]. Cardiovascular: RRR, no murmurs / rubs / gallops. S1 and S2 auscultated. No extremity edema. 2+ pedal pulses.  Abdomen: Soft, non-tender, non-distended. No masses palpated. No appreciable hepatosplenomegaly. Bowel sounds positive x4.  GU: Deferred. Musculoskeletal: No clubbing / cyanosis of digits/nails. No joint deformity upper and lower extremities. No Contractures Skin: No rashes, lesions, ulcers on limited skin evaluation. No induration; Warm and dry.  Neurologic: CN 2-12 grossly intact with no  focal deficits.  Romberg sign cerebellar reflexes not assessed.  Psychiatric: Normal judgment and insight. Alert and oriented x 3. Normal mood and appropriate affect.   Labs on Admission: I have personally reviewed following labs and imaging studies  CBC:  Recent Labs Lab 11/13/16 1128  WBC 8.1  HGB 14.1  HCT 41.5  MCV 87.2  PLT 196   Basic Metabolic Panel:  Recent Labs Lab 11/13/16 1128  NA 133*  K 4.3  CL 98*  CO2 25  GLUCOSE 289*  BUN 6  CREATININE 0.84  CALCIUM 8.7*   GFR: CrCl cannot be calculated (Unknown ideal weight.). Liver Function Tests: No results for input(s): AST, ALT, ALKPHOS, BILITOT, PROT, ALBUMIN in the last 168 hours. No results for input(s): LIPASE, AMYLASE in the last 168 hours. No results for input(s): AMMONIA in the last 168 hours. Coagulation Profile: No results for input(s): INR, PROTIME in the last 168 hours. Cardiac Enzymes: No results for input(s): CKTOTAL, CKMB, CKMBINDEX, TROPONINI in the last 168 hours. BNP (last  3 results) No results for input(s): PROBNP in the last 8760 hours. HbA1C: No results for input(s): HGBA1C in the last 72 hours. CBG:  Recent Labs Lab 11/13/16 1333  GLUCAP 282*   Lipid Profile: No results for input(s): CHOL, HDL, LDLCALC, TRIG, CHOLHDL, LDLDIRECT in the last 72 hours. Thyroid Function Tests: No results for input(s): TSH, T4TOTAL, FREET4, T3FREE, THYROIDAB in the last 72 hours. Anemia Panel: No results for input(s): VITAMINB12, FOLATE, FERRITIN, TIBC, IRON, RETICCTPCT in the last 72 hours. Urine analysis:    Component Value Date/Time   COLORURINE AMBER (A) 07/26/2012 1850   APPEARANCEUR CLOUDY (A) 07/26/2012 1850   LABSPEC 1.031 (H) 07/26/2012 1850   PHURINE 6.0 07/26/2012 1850   GLUCOSEU >1000 (A) 07/26/2012 1850   HGBUR NEGATIVE 07/26/2012 1850   BILIRUBINUR SMALL (A) 07/26/2012 1850   KETONESUR 15 (A) 07/26/2012 1850   PROTEINUR NEGATIVE 07/26/2012 1850   UROBILINOGEN 0.2 07/26/2012 1850    NITRITE NEGATIVE 07/26/2012 1850   LEUKOCYTESUR NEGATIVE 07/26/2012 1850   Sepsis Labs: !!!!!!!!!!!!!!!!!!!!!!!!!!!!!!!!!!!!!!!!!!!! @LABRCNTIP (procalcitonin:4,lacticidven:4) )No results found for this or any previous visit (from the past 240 hour(s)).   Radiological Exams on Admission: Dg Chest 2 View  Result Date: 11/13/2016 CLINICAL DATA:  Productive cough for 2 days. EXAM: CHEST  2 VIEW COMPARISON:  08/24/2015 FINDINGS: The heart size and mediastinal contours are within normal limits. Both lungs are clear. The visualized skeletal structures are unremarkable. IMPRESSION: No active cardiopulmonary disease. Electronically Signed   By: Signa Kell M.D.   On: 11/13/2016 11:33   Ct Angio Chest Pe W Or Wo Contrast  Result Date: 11/13/2016 CLINICAL DATA:  Productive cough for 2 days. Chest pain with coughing. EXAM: CT ANGIOGRAPHY CHEST WITH CONTRAST TECHNIQUE: Multidetector CT imaging of the chest was performed using the standard protocol during bolus administration of intravenous contrast. Multiplanar CT image reconstructions and MIPs were obtained to evaluate the vascular anatomy. CONTRAST:  100 mL of Isovue 370 COMPARISON:  None. FINDINGS: Cardiovascular: The heart size is unchanged and unremarkable. The thoracic aorta is non aneurysmal with no dissection. Evaluation of pulmonary arteries is limited due to mild respiratory motion, artifact, and timing of contrast. Within this limitation, no pulmonary emboli identified. Mediastinum/Nodes: The 1.7 cm nodule in the right thyroid lobe is unchanged by my measurement. No effusions. The esophagus is normal. No adenopathy. Lungs/Pleura: Scattered subsegmental atelectasis is identified with no suspicious nodules, masses, or focal infiltrates. The central airways are normal. No pneumothorax. Upper Abdomen: No acute abnormality. Musculoskeletal: No chest wall abnormality. No acute or significant osseous findings. Review of the MIP images confirms the above  findings. IMPRESSION: 1. Evaluation for pulmonary emboli is limited due to artifact and respiratory motion but no convincing pulmonary emboli are identified. 2. Stable 1.6 cm nodule in the right thyroid lobe. An ultrasound could better evaluate. Electronically Signed   By: Gerome Sam III M.D   On: 11/13/2016 16:04   EKG: No EKG done on Admission; Will order one.   Assessment/Plan Principal Problem:   Respiratory failure with hypoxia (HCC) Active Problems:   Diabetes mellitus (HCC)   Hypoxia   SIRS (systemic inflammatory response syndrome) (HCC)   Influenza A   Anxiety and depression   Thyroid nodule  1. Acute Hypoxic Respiratory Failure likely 2/2 to Influenza A -Observation Telemetry; Found to be Hypoxic on Room Air -Continuous Pulse Oximetry -Supplemental O2 via [place]; Maintain O2 Saturations > 92% -DuoNeb Breathing Treatments -C/w Home BREO Ellipta -Continue to Monitor Closely -CXR showed The heart  size and mediastinal contours are within normal limits. Both lungs are clear. The visualized skeletal structures are unremarkable. -PE R/o'd Out as Chest CT Angio showed Evaluation for pulmonary emboli is limited due to artifact and respiratory motion but no convincing pulmonary emboli are identified. -Will need Walk Screen Prior to D/C to evaluate for Home O2  2. SIRS 2/2 to Influenza A -Patient was Tachycardic, Tachypenic and Had Low Grade Fever on Admission -No blood Cx were obtained by EDP; CXR Negative, Urinalysis not Collected -Has Significant Myalgias -Supportive Care with Oseltamivir 75 mg BID x 5 days -IVF Rehydration with NS at 75 mL/hr -Acetaminophen and Tramadol for Myalgia pain; May need Robaxin   3. Thyroid Nodule -CT Angio showed Stable 1.6 cm nodule in the right thyroid lobe.  -Ordered Ultrasound of the Thyroid to better evaluate. -Check TSH  4. Insulin Dependent Diabetes Mellitus Type 2 complicated by Diabetic Neuropathy -Hold Home Saxagliptin 5 mg po daily  and home Insulin NPH -Lantus 20 Units sq qHS and Moderate Novolog SSI -Check HbA1c -Continue to Monitor CBG's closely -C/w Home Gabapentin 400 mg po TID  5. Depression/Anxiety  -C/w Home Citalopram 40 mg po Daily and Home Clonazepam 1 mg po BID  6. Lumbar Disc Disease -C/w Cyclobenzaprine and Tramadol for Pain from Bulging discs   DVT prophylaxis: Lovenox Code Status: FULL CODE Family Communication: Discussed with Husband at bedside Disposition Plan: Home tomorrow if improved Consults called: None Admission status: Obs Telemetry  [employer], D.O. [facility] Pager [phone]  If 7PM-7AM, please contact night-coverage [email or web address] Password [email or web address]  11/13/2016, 6:09 PM

## 2016-11-13 NOTE — Care Management Note (Signed)
Case Management Note  Patient Details  Name: Veronica [name] MRN:Silvio Pate [id number] Date of Birth: 04/20/1961  Subjective/Objective:                  Body aches, cough  Action/Plan: CM spoke with patient at the bedside in the ED. Patient reports she has been out of work for approximately 1 year. States she cannot afford her Novolin N Insulin. The cost of her insulin with her  Insurance converage is $57.00. Reports she applied for the manufacturer's patient assistance program last year but was told her husband's income is too much for her to receive assistance.Patient lives at home with her husband who she states is able to assist her when she is discharged home. Reports they arrived via ambulance to the ED today. Her husband is at the bedside and may need assistance with returning to their home. CM will continue to follow for discharge needs.   Expected Discharge Date:   (unknown)               Expected Discharge Plan:  Home/Self Care  In-House Referral:     Discharge planning Services  CM Consult, Medication Assistance  Post Acute Care Choice:    Choice offered to:     DME Arranged:    DME Agency:     HH Arranged:    HH Agency:     Status of Service:  In process, will continue to follow  If discussed at Long Length of Stay Meetings, dates discussed:    Additional Comments:  Antony HasteBennett, Giann Obara Harris, RN 11/13/2016, 6:39 PM

## 2016-11-14 ENCOUNTER — Telehealth: Payer: Self-pay | Admitting: Internal Medicine

## 2016-11-14 ENCOUNTER — Encounter (HOSPITAL_COMMUNITY): Payer: Self-pay

## 2016-11-14 DIAGNOSIS — E871 Hypo-osmolality and hyponatremia: Secondary | ICD-10-CM

## 2016-11-14 DIAGNOSIS — J9601 Acute respiratory failure with hypoxia: Secondary | ICD-10-CM | POA: Diagnosis present

## 2016-11-14 DIAGNOSIS — I252 Old myocardial infarction: Secondary | ICD-10-CM | POA: Diagnosis not present

## 2016-11-14 DIAGNOSIS — J101 Influenza due to other identified influenza virus with other respiratory manifestations: Secondary | ICD-10-CM | POA: Diagnosis not present

## 2016-11-14 DIAGNOSIS — Z8249 Family history of ischemic heart disease and other diseases of the circulatory system: Secondary | ICD-10-CM | POA: Diagnosis not present

## 2016-11-14 DIAGNOSIS — D702 Other drug-induced agranulocytosis: Secondary | ICD-10-CM | POA: Diagnosis present

## 2016-11-14 DIAGNOSIS — Z825 Family history of asthma and other chronic lower respiratory diseases: Secondary | ICD-10-CM | POA: Diagnosis not present

## 2016-11-14 DIAGNOSIS — Z794 Long term (current) use of insulin: Secondary | ICD-10-CM | POA: Diagnosis not present

## 2016-11-14 DIAGNOSIS — R0902 Hypoxemia: Secondary | ICD-10-CM | POA: Diagnosis not present

## 2016-11-14 DIAGNOSIS — R51 Headache: Secondary | ICD-10-CM | POA: Diagnosis present

## 2016-11-14 DIAGNOSIS — E1142 Type 2 diabetes mellitus with diabetic polyneuropathy: Secondary | ICD-10-CM

## 2016-11-14 DIAGNOSIS — M519 Unspecified thoracic, thoracolumbar and lumbosacral intervertebral disc disorder: Secondary | ICD-10-CM | POA: Diagnosis present

## 2016-11-14 DIAGNOSIS — Z6841 Body Mass Index (BMI) 40.0 and over, adult: Secondary | ICD-10-CM | POA: Diagnosis not present

## 2016-11-14 DIAGNOSIS — Z87891 Personal history of nicotine dependence: Secondary | ICD-10-CM | POA: Diagnosis not present

## 2016-11-14 DIAGNOSIS — E041 Nontoxic single thyroid nodule: Secondary | ICD-10-CM | POA: Diagnosis present

## 2016-11-14 DIAGNOSIS — Z9049 Acquired absence of other specified parts of digestive tract: Secondary | ICD-10-CM | POA: Diagnosis not present

## 2016-11-14 DIAGNOSIS — F419 Anxiety disorder, unspecified: Secondary | ICD-10-CM | POA: Diagnosis present

## 2016-11-14 DIAGNOSIS — E1140 Type 2 diabetes mellitus with diabetic neuropathy, unspecified: Secondary | ICD-10-CM | POA: Diagnosis present

## 2016-11-14 DIAGNOSIS — E669 Obesity, unspecified: Secondary | ICD-10-CM | POA: Diagnosis present

## 2016-11-14 DIAGNOSIS — F418 Other specified anxiety disorders: Secondary | ICD-10-CM | POA: Diagnosis not present

## 2016-11-14 DIAGNOSIS — Z006 Encounter for examination for normal comparison and control in clinical research program: Secondary | ICD-10-CM

## 2016-11-14 DIAGNOSIS — R6510 Systemic inflammatory response syndrome (SIRS) of non-infectious origin without acute organ dysfunction: Secondary | ICD-10-CM | POA: Diagnosis not present

## 2016-11-14 DIAGNOSIS — G4440 Drug-induced headache, not elsewhere classified, not intractable: Secondary | ICD-10-CM | POA: Diagnosis not present

## 2016-11-14 DIAGNOSIS — K219 Gastro-esophageal reflux disease without esophagitis: Secondary | ICD-10-CM | POA: Diagnosis present

## 2016-11-14 DIAGNOSIS — G4733 Obstructive sleep apnea (adult) (pediatric): Secondary | ICD-10-CM | POA: Diagnosis present

## 2016-11-14 DIAGNOSIS — Z79899 Other long term (current) drug therapy: Secondary | ICD-10-CM | POA: Diagnosis not present

## 2016-11-14 DIAGNOSIS — I472 Ventricular tachycardia: Secondary | ICD-10-CM | POA: Diagnosis present

## 2016-11-14 DIAGNOSIS — R05 Cough: Secondary | ICD-10-CM | POA: Diagnosis present

## 2016-11-14 DIAGNOSIS — F329 Major depressive disorder, single episode, unspecified: Secondary | ICD-10-CM | POA: Diagnosis present

## 2016-11-14 DIAGNOSIS — J09X2 Influenza due to identified novel influenza A virus with other respiratory manifestations: Secondary | ICD-10-CM | POA: Diagnosis present

## 2016-11-14 DIAGNOSIS — Z833 Family history of diabetes mellitus: Secondary | ICD-10-CM | POA: Diagnosis not present

## 2016-11-14 LAB — URINALYSIS, ROUTINE W REFLEX MICROSCOPIC
BILIRUBIN URINE: NEGATIVE
Bacteria, UA: NONE SEEN
Hgb urine dipstick: NEGATIVE
KETONES UR: 5 mg/dL — AB
LEUKOCYTES UA: NEGATIVE
Nitrite: NEGATIVE
PH: 5 (ref 5.0–8.0)
PROTEIN: 30 mg/dL — AB
Specific Gravity, Urine: 1.044 — ABNORMAL HIGH (ref 1.005–1.030)

## 2016-11-14 LAB — COMPREHENSIVE METABOLIC PANEL WITH GFR
ALT: 26 U/L (ref 14–54)
AST: 27 U/L (ref 15–41)
Albumin: 3.3 g/dL — ABNORMAL LOW (ref 3.5–5.0)
Alkaline Phosphatase: 49 U/L (ref 38–126)
Anion gap: 7 (ref 5–15)
BUN: 8 mg/dL (ref 6–20)
CO2: 25 mmol/L (ref 22–32)
Calcium: 7.9 mg/dL — ABNORMAL LOW (ref 8.9–10.3)
Chloride: 99 mmol/L — ABNORMAL LOW (ref 101–111)
Creatinine, Ser: 0.84 mg/dL (ref 0.44–1.00)
GFR calc Af Amer: >60 mL/min
GFR calc non Af Amer: >60 mL/min
Glucose, Bld: 279 mg/dL — ABNORMAL HIGH (ref 65–99)
Potassium: 4.2 mmol/L (ref 3.5–5.1)
Sodium: 131 mmol/L — ABNORMAL LOW (ref 135–145)
Total Bilirubin: 0.7 mg/dL (ref 0.3–1.2)
Total Protein: 6.3 g/dL — ABNORMAL LOW (ref 6.5–8.1)

## 2016-11-14 LAB — COMPREHENSIVE METABOLIC PANEL
ALBUMIN: 3.2 g/dL — AB (ref 3.5–5.0)
ALT: 26 U/L (ref 14–54)
AST: 31 U/L (ref 15–41)
Alkaline Phosphatase: 55 U/L (ref 38–126)
Anion gap: 4 — ABNORMAL LOW (ref 5–15)
BILIRUBIN TOTAL: 0.5 mg/dL (ref 0.3–1.2)
BUN: 8 mg/dL (ref 6–20)
CHLORIDE: 101 mmol/L (ref 101–111)
CO2: 30 mmol/L (ref 22–32)
CREATININE: 0.72 mg/dL (ref 0.44–1.00)
Calcium: 7.8 mg/dL — ABNORMAL LOW (ref 8.9–10.3)
GFR calc Af Amer: >60 mL/min (ref >60)
GLUCOSE: 175 mg/dL — AB (ref 65–99)
Potassium: 3.9 mmol/L (ref 3.5–5.1)
Sodium: 135 mmol/L (ref 135–145)
Total Protein: 6.3 g/dL — ABNORMAL LOW (ref 6.5–8.1)

## 2016-11-14 LAB — CBC WITH DIFFERENTIAL/PLATELET
BASOS ABS: 0 10*3/uL (ref 0.0–0.1)
BASOS PCT: 0 %
Eosinophils Absolute: 0 10*3/uL (ref 0.0–0.7)
Eosinophils Relative: 0 %
HEMATOCRIT: 35.8 % — AB (ref 36.0–46.0)
Hemoglobin: 11.7 g/dL — ABNORMAL LOW (ref 12.0–15.0)
LYMPHS PCT: 20 %
Lymphs Abs: 1 10*3/uL (ref 0.7–4.0)
MCH: 29 pg (ref 26.0–34.0)
MCHC: 32.7 g/dL (ref 30.0–36.0)
MCV: 88.8 fL (ref 78.0–100.0)
Monocytes Absolute: 1.1 10*3/uL — ABNORMAL HIGH (ref 0.1–1.0)
Monocytes Relative: 21 %
NEUTROS ABS: 3 10*3/uL (ref 1.7–7.7)
NEUTROS PCT: 59 %
PLATELETS: 152 10*3/uL (ref 150–400)
RBC: 4.03 MIL/uL (ref 3.87–5.11)
RDW: 12.6 % (ref 11.5–15.5)
WBC: 5 10*3/uL (ref 4.0–10.5)

## 2016-11-14 LAB — GLUCOSE, CAPILLARY
GLUCOSE-CAPILLARY: 175 mg/dL — AB (ref 65–99)
GLUCOSE-CAPILLARY: 195 mg/dL — AB (ref 65–99)
Glucose-Capillary: 305 mg/dL — ABNORMAL HIGH (ref 65–99)
Glucose-Capillary: 162 mg/dL — ABNORMAL HIGH (ref 65–99)
Glucose-Capillary: 191 mg/dL — ABNORMAL HIGH (ref 65–99)

## 2016-11-14 LAB — PROTIME-INR
INR: 1.1
Prothrombin Time: 14.2 seconds (ref 11.4–15.2)

## 2016-11-14 LAB — TROPONIN I

## 2016-11-14 LAB — CBC
HEMATOCRIT: 37 % (ref 36.0–46.0)
Hemoglobin: 12.2 g/dL (ref 12.0–15.0)
MCH: 28.7 pg (ref 26.0–34.0)
MCHC: 33 g/dL (ref 30.0–36.0)
MCV: 87.1 fL (ref 78.0–100.0)
Platelets: 161 10*3/uL (ref 150–400)
RBC: 4.25 MIL/uL (ref 3.87–5.11)
RDW: 12.5 % (ref 11.5–15.5)
WBC: 6.4 10*3/uL (ref 4.0–10.5)

## 2016-11-14 LAB — TSH: TSH: 0.34 u[IU]/mL — ABNORMAL LOW (ref 0.350–4.500)

## 2016-11-14 LAB — APTT: aPTT: 33 seconds (ref 24–36)

## 2016-11-14 LAB — PHOSPHORUS: Phosphorus: 2.6 mg/dL (ref 2.5–4.6)

## 2016-11-14 LAB — T4, FREE: Free T4: 1.03 ng/dL (ref 0.61–1.12)

## 2016-11-14 LAB — MAGNESIUM: Magnesium: 1.8 mg/dL (ref 1.7–2.4)

## 2016-11-14 MED ORDER — GUAIFENESIN-DM 100-10 MG/5ML PO SYRP
5.0000 mL | ORAL_SOLUTION | ORAL | Status: DC | PRN
  Administered 2016-11-14: 5 mL via ORAL
  Filled 2016-11-14: qty 10

## 2016-11-14 MED ORDER — IPRATROPIUM-ALBUTEROL 0.5-2.5 (3) MG/3ML IN SOLN
3.0000 mL | Freq: Three times a day (TID) | RESPIRATORY_TRACT | Status: DC
  Administered 2016-11-14 (×2): 3 mL via RESPIRATORY_TRACT
  Filled 2016-11-14 (×2): qty 3

## 2016-11-14 MED ORDER — IPRATROPIUM-ALBUTEROL 0.5-2.5 (3) MG/3ML IN SOLN
3.0000 mL | Freq: Four times a day (QID) | RESPIRATORY_TRACT | Status: DC
  Administered 2016-11-14 – 2016-11-17 (×12): 3 mL via RESPIRATORY_TRACT
  Filled 2016-11-14 (×12): qty 3

## 2016-11-14 MED ORDER — INSULIN GLARGINE 100 UNIT/ML ~~LOC~~ SOLN
35.0000 [IU] | Freq: Every day | SUBCUTANEOUS | Status: DC
  Administered 2016-11-14: 35 [IU] via SUBCUTANEOUS
  Filled 2016-11-14 (×2): qty 0.35

## 2016-11-14 MED ORDER — STUDY MED - FLU-IGIV 500 ML FINAL VOLUME (PI-FEINSTEIN)
500.0000 mL | Freq: Once | Status: AC
  Administered 2016-11-14: 500 mL via INTRAVENOUS
  Filled 2016-11-14: qty 500

## 2016-11-14 MED ORDER — DM-GUAIFENESIN ER 30-600 MG PO TB12
1.0000 | ORAL_TABLET | Freq: Two times a day (BID) | ORAL | Status: DC
  Administered 2016-11-14 – 2016-11-17 (×7): 1 via ORAL
  Filled 2016-11-14 (×7): qty 1

## 2016-11-14 NOTE — Telephone Encounter (Signed)
Triage  This Veronica PateMarion A [name] patient is 11/24/1960 [facility] and I am seeing her./ Her husband Veronica [name] [birth year]/23/52 needs tamiflu75mg  per day.he denies allergies and has normal renal function. Please call his PCP Couillard, Lise AuerJennifer L, PA-C primary and have her call in tamiflu 75mg  daily x 10 days for prevention. They need to do it today 11/14/2016 .  If not, please do so to [facility] [address]  Dr. Kalman ShanMurali Arless Vineyard, M.D., [facility].P Pulmonary and Critical Care Medicine Staff Physician [place] [facility] [place] [facility] Pager: [phone], If no answer or between  15:00h - 7:00h: call [phone]  11/14/2016 2:38 PM

## 2016-11-14 NOTE — Telephone Encounter (Signed)
There is not a patient in Epic with that name and DOB.  MR - please advise. Thanks.

## 2016-11-14 NOTE — Progress Notes (Signed)
Title: A Randomized, Double-Blind, Placebo-Controlled Dose Ranging Study Evaluating the Safety Pharmacokinetics and Clinical Benefit of FLU-IGIV in Hospitalized Patients with Serious Influenza A infection. IA-001 (ClinicalTrials.gov Identifier: [id number], Protocol No: IA-001, [facility] Protocol #[id number])  RESEARCH SUBJECT. This research study is sponsored by [employer]   The investigational product is called NP-025 aka FLU-IGIV or anti-influenza immune globulin intravenous. It is produced from source plasma collected from [place] Education officer, environmental (FDA) licensed plasma collection establishments from healthy donors who have recovered from influenza (convalescent) and/or were vaccinated against seasonal influenza strains. The plasma contains a relatively high concentration of polyclonal antibodies directed against seasonal influenza strains, specifically influenza A strains H1N1 ([place], [place]) and H3N2 ([place]). It is a glycoprotein of 150-160 kilodaltons against Hemagluttinin (HA) and Neuraminidase (NA) surface proteins.   Key Inclusion Criteria: Adult patients; locally determined positive influenza A infection (Rapid Antigen Test or PCR) from a specimen obtained within 2 days prior to randomization; onset of symptoms </= 6 days prior to randomization; experiencing >/= 1 respiratory symptom (cough, sore throat, nasal congestion) and >/= 1 constitutional symptom (headache, myalgia, feverishness or fatigue); NEW score >/=3 at screening; must have an active order for a minimum 5 day course of oseltamivir (75mg /twice daily).  Key Exclusion Criteria: History of hypersensitivity to blood or plasma products; history of allergy to latex or rubber; pregnancy or lactation; known IgA deficiency; medical conditions for which receipt of a 500 mL volume of IV fluid may be dangerous; a pre-existing condition or use of medication that may place the individual at a  substantially increased risk of thrombosis; anticipated life expectancy < 90 days; confirmed bacterial pneumonia or any concurrent respiratory viral infection that is not influenza A.  Key other data: Side effects with infusion  Incidence Occurrence Side effect  Common 1-15%, typically < 5% First 30 minutes of infusion Back pain, abdominal pain, nausea, vomiting  Common 1-15%, typically < 5% First few minutes to several hours after infusion Chills, fever, headache, myalgia, fatigue  Rare Seconds to several hours after infusion Hypersensitivity reaction (happens with IgA deficiency), flushing, facial swelling, dyspnea, cyanosis, shock, cardiac arrest, pneumonitis, renal failure, aseptic meningitis, hemolysis, viral infection  Rare Seconds to several hours after infusion Thrombosis (at risk patients are those with a history of DVT, arterial thrombosis, multiple cardiovascular risk factors, impaired cardiac output, advanced age, coagulation disorders, prolonged immobilization, use of estrogen, indwelling catheters, known/suspected hyperviscosity)  Recommendation: For patients who are at risk of developing thrombotic events, administer NP-025 at the minimum rate of infusion practicable, not to exceed 2 mL/min. Ensure adequate hydration in patients before administration. Monitor  Rare 1-6 hours after infusion Transfusion related Acute Lung Injury (TRALI)  Rare  Hemolysis (dose related, happens in total > 2g/kg and non-O blood group), severe hemolysis may lead to renal dysfunction/failure.  Recommendation: Check hemoglobin pre- and post 36-96h and 7-10 days transfusion  Rare Several hours to two  days after infusion Aseptic Meningitis (AMS) (dose related, happens in total > 2g/kg)   NOTE: NP-025 contains 10% maltose - will interfere with glucose measurements if non-glucose specific measurement devices are not used. Results in false high glucose levels can  result in increased insulin -> life threatening  hypoglycemia  ...................................................................................................................................................................  .................................................................................................................................................. PI note: came 8:51 AM 11/14/2016 to discuss above study - she is interested but is physically exhausted and wants me to talk to her husband Mr Liara Holm - called [phone] and lmtcb. STudy details discussed with  clinical research nurse Isabella BowensFrances Aberion and in presence of bedside nurse   Per bedside nurse - still febrile, unable to wean off o2 overnight and tachycardic. Currently on 2L  (dropped to 87% on RA immediately after turning o2 off at 9:02 AM 11/14/2016)  . D/w bedside primary MD - Dr Karna Christmasmar Latif Sheik - he agrees patient is worse. He will change from observation to inpatient study. Expected length of stay is few days atleast. He has no objections to KeyCorpMarion A Marsch 12/08/1960 being in research study  Discussed with husband Mr Sherry RuffingBarry Kerschner at 9.35am over phone and again with wife at 9.45am 11/14/2016 -  - went over above details  - also, went over basics of research trial - and preconsent education - see below   - they both are interested and understand is voluntary.  - copy of ICF left with patient   - husband will be back at 2pm (he is tired and exhausted after being with patient all night)     Following were discussed   1. Scientific Purpose  Clinical research is designed to produce generalizable knowledge and to answer questions about the safety and efficacy of intervention(s) under study in order to determine whether or not they may be useful for the care of future patients.  2. Study Procedures  Participation in a trial may involve procedures or tests, in addition to the intervention(s) under study, that are intended only or primarily to generate  scientific knowledge and that are otherwise not necessary for patient care.   3. Uncertainty  For intervention(s) under study in clinical research, there often is less knowledge and more uncertainty about the risks and benefits to a population of trial participants than there is when a doctor offers a patient standard interventions.   4. Adherence to Protocol  Administration of the intervention(s) under study is typically based on a strict protocol with defined dose, scheduling, and use or avoidance of concurrent medications, compared to administration of standard interventions.  5. Clinician as Investigator  Clinicians who are in health care settings provide treatment; in a clinical trial setting, they are also investigating safety and efficacy of an intervention. In otherwise your doctor or nurse practitioner can be wearing 2 hats - one as care giver another as Oceanographerresearch investigator  6. Patient as Engineer, agriculturalVolunteer or Research Subject  Patients participating in research trials are research subjects or volunteers. In other words participating in research is 100% voluntary and at one's own free weill. The decision to participate or not participate will NOT affect patient care and the doctor-patient relationship in any way  7. Financial Conflict of Interest Disclosure  One or more of the investigators of  the research trial might an investment interest in [employer], [facility] the clinical trials site and is both the company and the investigators are being compensated for their effort in trial research activities.      Dr. Kalman ShanMurali Jamarien Rodkey, M.D., [facility]C.P Pulmonary and Critical Care Medicine Staff Physician [place] [facility] [place] [facility] and Critical [facility] Pager: [phone], If no answer or between  15:00h - 7:00h: call [phone]  11/14/2016 9:49 AM

## 2016-11-14 NOTE — Progress Notes (Signed)
Paged by Dr. Marchelle Gearingamaswamy at 7:33 PM regarding Ms. Lineman having 7 beat run of VT. A troponin, magnesium, and phosphorus were ordered by Dr. Marchelle Gearingamaswamy. Will follow-up and treat as needed. Dr. Marchelle Gearingamaswamy also wanted me to make sure that the day team did not discharge patient before discussions with pulmonary as patient is part of a reserch study.

## 2016-11-14 NOTE — Progress Notes (Addendum)
Title: A Randomized, Double-Blind, Placebo-Controlled Dose Ranging Study Evaluating the Safety Pharmacokinetics and Clinical Benefit of FLU-IGIV in Hospitalized Patients with Serious Influenza A infection. IA-001 (ClinicalTrials.gov Identifier: [id number], Protocol No: IA-001, [facility] Protocol #[id number])  RESEARCH SUBJECT. This research study is sponsored by [employer]   The investigational product is called NP-025 aka FLU-IGIV or anti-influenza immune globulin intravenous. It is produced from source plasma collected from [place] Transport planner (FDA) licensed plasma collection establishments from healthy donors who have recovered from influenza (convalescent) and/or were vaccinated against seasonal influenza strains. The plasma contains a relatively high concentration of polyclonal antibodies directed against seasonal influenza strains, specifically influenza A strains H1N1 ([place], [place]) and H3N2 ([place]). It is a glycoprotein of 150-160 kilodaltons against Hemagluttinin (HA) and Neuraminidase (NA) surface proteins.   Key Inclusion Criteria: Adult patients; locally determined positive influenza A infection (Rapid Antigen Test or PCR) from a specimen obtained within 2 days prior to randomization; onset of symptoms </= 6 days prior to randomization; experiencing >/= 1 respiratory symptom (cough, sore throat, nasal congestion) and >/= 1 constitutional symptom (headache, myalgia, feverishness or fatigue); NEW score >/=3 at screening; must have an active order for a minimum 5 day course of oseltamivir (71m/twice daily).  Key Exclusion Criteria: History of hypersensitivity to blood or plasma products; history of allergy to latex or rubber; pregnancy or lactation; known IgA deficiency; medical conditions for which receipt of a 500 mL volume of IV fluid may be dangerous; a pre-existing condition or use of medication that may place the individual at a  substantially increased risk of thrombosis; anticipated life expectancy < 90 days; confirmed bacterial pneumonia or any concurrent respiratory viral infection that is not influenza A.  Key other data: Side effects with infusion  Incidence Occurrence Side effect  Common 1-15%, typically < 5% First 30 minutes of infusion Back pain, abdominal pain, nausea, vomiting  Common 1-15%, typically < 5% First few minutes to several hours after infusion Chills, fever, headache, myalgia, fatigue  Rare Seconds to several hours after infusion Hypersensitivity reaction (happens with IgA deficiency), flushing, facial swelling, dyspnea, cyanosis, shock, cardiac arrest, pneumonitis, renal failure, aseptic meningitis, hemolysis, viral infection  Rare Seconds to several hours after infusion Thrombosis (at risk patients are those with a history of DVT, arterial thrombosis, multiple cardiovascular risk factors, impaired cardiac output, advanced age, coagulation disorders, prolonged immobilization, use of estrogen, indwelling catheters, known/suspected hyperviscosity)  Recommendation: For patients who are at risk of developing thrombotic events, administer NP-025 at the minimum rate of infusion practicable, not to exceed 2 mL/min. Ensure adequate hydration in patients before administration. Monitor  Rare 1-6 hours after infusion Transfusion related Acute Lung Injury (TRALI)  Rare  Hemolysis (dose related, happens in total > 2g/kg and non-O blood group), severe hemolysis may lead to renal dysfunction/failure.  Recommendation: Check hemoglobin pre- and post 36-96h and 7-10 days transfusion  Rare Several hours to two  days after infusion Aseptic Meningitis (AMS) (dose related, happens in total > 2g/kg)   NOTE: NP-025 contains 10% maltose - will interfere with glucose measurements if non-glucose specific measurement devices are not used. Results in false high glucose levels can  result in increased insulin -> life threatening  hypoglycemia  ...................................................................................................   PI Note - INFUSION assessment S: subject MLUTICIA TADROShas been randomized. Met with her again. She is still not feeling better. Still agreeable for research study. Per research RN -she did have 7 beat run wide complex tachycardia  that resolved spontaneously at 5pm (post consenting , pre-study drug). Having ongoing flu related headache and fatigue and myalgia from pre-consent unchanged. Otherwise no new issues   O Vitals:   11/14/16 1547 11/14/16 1628 11/14/16 1856 11/14/16 1927  BP:  128/71  117/75  Pulse:  81 72 74  Resp:  '18 18 18  ' Temp:  99.3 F (37.4 C) 99.1 F (37.3 C) 99.6 F (37.6 C)  TempSrc:  Oral Oral Oral  SpO2: 96% 100% 100% 93%  Weight:      Height:       93% on 2L    EXAM  - done earlier  LABS -     PULMONARY No results for input(s): PHART, PCO2ART, PO2ART, HCO3, TCO2, O2SAT in the last 168 hours.  Invalid input(s): PCO2, PO2  CBC  Recent Labs Lab 11/13/16 1128 11/14/16 0500 11/14/16 1623  HGB 14.1 12.2 11.7*  HCT 41.5 37.0 35.8*  WBC 8.1 6.4 5.0  PLT 196 161 152    COAGULATION  Recent Labs Lab 11/14/16 1623  INR 1.10    CARDIAC  No results for input(s): TROPONINI in the last 168 hours. No results for input(s): PROBNP in the last 168 hours.   CHEMISTRY  Recent Labs Lab 11/13/16 1128 11/14/16 0500 11/14/16 1623  NA 133* 131* 135  K 4.3 4.2 3.9  CL 98* 99* 101  CO2 '25 25 30  ' GLUCOSE 289* 279* 175*  BUN '6 8 8  ' CREATININE 0.84 0.84 0.72  CALCIUM 8.7* 7.9* 7.8*   Estimated Creatinine Clearance: 101.9 mL/min (by C-G formula based on SCr of 0.72 mg/dL).   LIVER  Recent Labs Lab 11/14/16 0500 11/14/16 1623  AST 27 31  ALT 26 26  ALKPHOS 49 55  BILITOT 0.7 0.5  PROT 6.3* 6.3*  ALBUMIN 3.3* 3.2*  INR  --  1.10     INFECTIOUS  Recent Labs Lab 11/13/16 1617  LATICACIDVEN 1.71      ENDOCRINE CBG (last 3)   Recent Labs  11/14/16 0721 11/14/16 1202 11/14/16 1625  GLUCAP 305* 162* 175*    A) Research subject - stil meets criteria willing to undergo study drug infusion post randomization Flu A positive Acute hypoxemic respiratory failure - mild - needing 2LNC  New (post-consent, pre-infusino) non-sustained wide complained tachycardia around 5pm 11/14/2016   - not related to study drug  - not an AE per study protocol  P) - proceed with research infusion - 30 min direct supervision being provided by myself as PI  - Std of care -> for arrythmia - per hospitalist (d/w Dr [place])- checking mag, phos, serial troponin   Dr. Brand Males, M.D., [facility].C.P Pulmonary and Critical Care Medicine Staff Physician [place] [facility] Pager: [phone], If no answer or between  15:00h - 7:00h: call [phone]  11/14/2016 7:37 PM    ADDENDUM  8:38 PM 11/14/2016 - at this point patient has been on infusion since 19.30 and tolerating it well without problems. I have been at bedside throughout which is  > 1 hour of the infusion   Dr. Brand Males, M.D., [facility].C.P Pulmonary and Critical Care Medicine Staff Physician [place] [facility] Pager: [phone], If no answer or between  15:00h - 7:00h: call [phone]  11/14/2016 8:40 PM

## 2016-11-14 NOTE — Progress Notes (Signed)
Title: A Randomized, Double-Blind, Placebo-Controlled Dose Ranging Study Evaluating the Safety Pharmacokinetics and Clinical Benefit of FLU-IGIV in Hospitalized Patients with Serious Influenza A infection. IA-001 (ClinicalTrials.gov Identifier: [id number], Protocol No: IA-001, [facility] #[id number])  RESEARCH SUBJECT. This research study is sponsored by [employer]   The investigational product is called NP-025 aka FLU-IGIV or anti-influenza immune globulin intravenous. It is produced from source plasma collected from [place] Education officer, environmentalood and Drug Administration (FDA) licensed plasma collection establishments from healthy donors who have recovered from influenza (convalescent) and/or were vaccinated against seasonal influenza strains. The plasma contains a relatively high concentration of polyclonal antibodies directed against seasonal influenza strains, specifically influenza A strains H1N1 ([place]) and H3N2 ([place]). It is a glycoprotein of 150-160 kilodaltons against Hemagluttinin (HA) and Neuraminidase (NA) surface proteins.   Key Inclusion Criteria: Adult patients; locally determined positive influenza A infection (Rapid Antigen Test or PCR) from a specimen obtained within 2 days prior to randomization; onset of symptoms </= 6 days prior to randomization; experiencing >/= 1 respiratory symptom (cough, sore throat, nasal congestion) and >/= 1 constitutional symptom (headache, myalgia, feverishness or fatigue); NEW score >/=3 at screening; must have an active order for a minimum 5 day course of oseltamivir (75mg /twice daily).  Key Exclusion Criteria: History of hypersensitivity to blood or plasma products; history of allergy to latex or rubber; pregnancy or lactation; known IgA deficiency; medical conditions for which receipt of a 500 mL volume of IV fluid may be dangerous; a pre-existing condition or use of medication that may place the individual at a  substantially increased risk of thrombosis; anticipated life expectancy < 90 days; confirmed bacterial pneumonia or any concurrent respiratory viral infection that is not influenza A.  Key other data: Side effects with infusion  Incidence Occurrence Side effect  Common 1-15%, typically < 5% First 30 minutes of infusion Back pain, abdominal pain, nausea, vomiting  Common 1-15%, typically < 5% First few minutes to several hours after infusion Chills, fever, headache, myalgia, fatigue  Rare Seconds to several hours after infusion Hypersensitivity reaction (happens with IgA deficiency), flushing, facial swelling, dyspnea, cyanosis, shock, cardiac arrest, pneumonitis, renal failure, aseptic meningitis, hemolysis, viral infection  Rare Seconds to several hours after infusion Thrombosis (at risk patients are those with a history of DVT, arterial thrombosis, multiple cardiovascular risk factors, impaired cardiac output, advanced age, coagulation disorders, prolonged immobilization, use of estrogen, indwelling catheters, known/suspected hyperviscosity)  Recommendation: For patients who are at risk of developing thrombotic events, administer NP-025 at the minimum rate of infusion practicable, not to exceed 2 mL/min. Ensure adequate hydration in patients before administration. Monitor  Rare 1-6 hours after infusion Transfusion related Acute Lung Injury (TRALI)  Rare  Hemolysis (dose related, happens in total > 2g/kg and non-O blood group), severe hemolysis may lead to renal dysfunction/failure.  Recommendation: Check hemoglobin pre- and post 36-96h and 7-10 days transfusion  Rare Several hours to two  days after infusion Aseptic Meningitis (AMS) (dose related, happens in total > 2g/kg)   NOTE: NP-025 contains 10% maltose - will interfere with glucose measurements if non-glucose specific measurement devices are not used. Results in false high glucose levels can  result in increased insulin -> life threatening  hypoglycemia  ...................................................................................................  Clinical Research Coordinator / Research RN note : This visit for Subject Veronica [name] with 10/30/1960 on 11/14/2016 for the above protocol is Visit/Encounter Day 1 and is for purpose of Informed Consent. The consent for this encounter is under  Protocol Version 2.0 and  is currently IRB approved. Sherry Ruffing [facility]) expressed continued interest and consent in continuing Veronica [name] a study subject.   In this visit 11/14/2016 the subject will be evaluated by investigator named Kalman Shan, MD. This research coordinator has verified that the investigator Kalman Shan is up to date with his/her training logs   IA-001 Informed Consent   Subject Name: Veronica [name]  This patient, Veronica [name], has been consented to the above clinical trial according to FDA regulations, GCP guidelines and [facility]'s SOPs. The informed consent form and study design have been explained to this patient's legally authorized representative and spouse, Damarys Speir, by this Study Coordinator at 14:46 on 11/14/2016. The surrogate demonstrated comprehension of the trial and study requirements/expectations. At this time, Veronica [name] was and is of sound mind. However, she is physically fatigued and has delegated assent to her spouse (LAR). She is fully aware of study details as explained to her by the principal investigator and research nurse. Per ICH/GCP guidelines, reconsent will be obtained when subject is able. No study procedures have been initiated before consenting of this patient/surrogate. This surrogate was given sufficient time for reading the consent form. All risks, benefits and options have been thoroughly discussed and all questions were answered per the surrogate's satisfaction. This patient/surrogate was not coerced in any way to participate in this clinical trial. The surrogate has  voluntarily signed consent version 1.1 at 14:46 on 11/14/2016. A copy of the signed consent form was given to the surrogate and a copy was placed in the subject's medical record. This surrogate was thanked for this patient's participation in research and contribution to science.  Signed by  Isabella Bowens, RN, BSN Clinical Research Coordinator / Nurse [facility]  [place], [place] 3:24 PM 11/14/2016

## 2016-11-14 NOTE — Progress Notes (Signed)
eLink Physician-Brief Progress Note Patient Name: Veronica PateMarion A [name] DOB: 01/17/1961 MRN: [id number]   Date of Service  11/14/2016  HPI/Events of Note  Labs reviewed.  eICU Interventions  Mg, Phos and troponin are WNL        Scout Gumbs 11/14/2016, 8:29 PM

## 2016-11-14 NOTE — Progress Notes (Addendum)
Title: A Randomized, Double-Blind, Placebo-Controlled Dose Ranging Study Evaluating the Safety Pharmacokinetics and Clinical Benefit of FLU-IGIV in Hospitalized Patients with Serious Influenza A infection. IA-001 (ClinicalTrials.gov Identifier: [id number], Protocol No: IA-001, [facility] Protocol #[id number])  RESEARCH SUBJECT. This research study is sponsored by [employer]  ...................................................................................................   Pi NOTE - SCREENING & BASELINE VISIT  1.Informed consent - process started earlier in the day on 11/14/2016 - see earlier note. Copy of ICF was left with [name] . She understood the study but was physically exhausted to read all the pages. She assented to study. She Gave husband rights to read the ICF and take decision on her behalf and jointly. Marland Kitchen He came at noon 11/14/2016 and research nurse him met him and subject around r 14.00h - he said he read the ICF in full and he went over it with subject. I visited with them both around 14.30 - confirmed interest, voluntary participation, and study details (all in ICF process checklist( understanding this is research with both husband and subject. He signed LAR consent due to her feeling exhausted though she understood study details.   2. I./E criterial reviewed with research nurse - and subject meets criteria. She will not need urine pregnancy test because she confirmed she is menopausal since 2009  3. History of present illness  - FLu A Positive  4. Past med   has a past medical history of Cough; Diabetes mellitus; H/O diverticulitis of colon; Heart attack (08/2009); Neuropathy ([place]); OSA (obstructive sleep apnea); and Sciatica.   reports that she quit smoking about 29 years ago. She quit after 4.00 years of use. She has never used smokeless tobacco.  Past Surgical History:  Procedure Laterality Date  . NASAL RECONSTRUCTION    . PARTIAL COLECTOMY    .  TONSILLECTOMY AND ADENOIDECTOMY    . UVULOPALATOPHARYNGOPLASTY      No Known Allergies   There is no immunization history on file for this patient.  Family History  Problem Relation Age of Onset  . Diabetes Mother     type 2  . Diabetes      cousin type 1  . Diabetes Brother   . Congestive Heart Failure Brother   . Emphysema Paternal Grandfather   . Tuberculosis Paternal Grandfather      Current Facility-Administered Medications:  .  0.9 %  sodium chloride infusion, , Intravenous, Continuous, [employer], DO, Last Rate: 75 mL/hr at 11/14/16 1121 .  acetaminophen (TYLENOL) tablet 650 mg, 650 mg, Oral, Q6H PRN, 650 mg at 11/14/16 1428 **OR** acetaminophen (TYLENOL) suppository 650 mg, 650 mg, Rectal, Q6H PRN, Bertram Savin Sheikh, DO .  bisacodyl (DULCOLAX) suppository 10 mg, 10 mg, Rectal, Daily PRN, Bertram Savin Sheikh, DO .  citalopram (CELEXA) tablet 40 mg, 40 mg, Oral, Daily, [facility], DO, 40 mg at 11/14/16 8182 .  clonazePAM (KLONOPIN) tablet 1 mg, 1 mg, Oral, BID PRN, Bertram Savin Sheikh, DO, 1 mg at 11/14/16 0004 .  cyclobenzaprine (FLEXERIL) tablet 10 mg, 10 mg, Oral, TID PRN, Bertram Savin Sheikh, DO, 10 mg at 11/13/16 1944 .  dextromethorphan-guaiFENesin (MUCINEX DM) 30-600 MG per 12 hr tablet 1 tablet, 1 tablet, Oral, BID, [place], DO, 1 tablet at 11/14/16 9937 .  enoxaparin (LOVENOX) injection 60 mg, 0.5 mg/kg, Subcutaneous, Q24H, Omair Latif Sheikh, DO, 60 mg at 11/13/16 2120 .  fluticasone furoate-vilanterol (BREO ELLIPTA) 100-25 MCG/INH 1 puff, 1 puff, Inhalation, Daily, [employer], DO, 1 puff at  11/14/16 1042 .  gabapentin (NEURONTIN) capsule 400 mg, 400 mg, Oral, TID, Omair Latif Sheikh, DO, 400 mg at 11/14/16 0315 .  guaiFENesin-dextromethorphan (ROBITUSSIN DM) 100-10 MG/5ML syrup 5 mL, 5 mL, Oral, Q4H PRN, Omair Latif Sheikh, DO, 5 mL at 11/14/16 0037 .  insulin aspart (novoLOG) injection 0-15 Units, 0-15 Units, Subcutaneous, TID WC,  Kerney Elbe, DO, 3 Units at 11/14/16 1209 .  insulin glargine (LANTUS) injection 35 Units, 35 Units, Subcutaneous, QHS, Omair Latif Sheikh, DO .  ipratropium-albuterol (DUONEB) 0.5-2.5 (3) MG/3ML nebulizer solution 3 mL, 3 mL, Nebulization, TID, Omair Latif Sheikh, DO, 3 mL at 11/14/16 1041 .  ondansetron (ZOFRAN) tablet 4 mg, 4 mg, Oral, Q6H PRN **OR** ondansetron (ZOFRAN) injection 4 mg, 4 mg, Intravenous, Q6H PRN, Bertram Savin Sheikh, DO, 4 mg at 11/14/16 0004 .  oseltamivir (TAMIFLU) capsule 75 mg, 75 mg, Oral, BID, Omair Latif Sheikh, DO, 75 mg at 11/14/16 9458 .  senna-docusate (Senokot-S) tablet 1 tablet, 1 tablet, Oral, QHS PRN, Bertram Savin Sheikh, DO .  sodium chloride flush (NS) 0.9 % injection 3 mL, 3 mL, Intravenous, Q12H, Omair Latif Sheikh, DO, 3 mL at 11/13/16 2200 .  traMADol (ULTRAM) tablet 50 mg, 50 mg, Oral, Q6H PRN, Bertram Savin Sheikh, DO, 50 mg at 11/13/16 1841    3. Vitals Vitals:   11/14/16 1000 11/14/16 1006 11/14/16 1042 11/14/16 1424  BP:    120/72  Pulse:    86  Resp:    18  Temp:    (!) 100.4 F (38 C)  TempSrc:    Axillary  SpO2: (!) 87% 95% 93% 98%  Weight:      Height:       98% is on 2L [place] On room air earlier she desaturated to 87%  EXAM - done 15.30h after ICF Was signed  BP 120/72 (BP Location: Right Wrist)   Pulse 86   Temp (!) 100.4 F (38 C) (Axillary)   Resp 18   Ht '5\' 5"'  (1.651 m)   Wt 120 kg (264 lb 8.8 oz)   SpO2 98%   BMI 44.02 kg/m   General Appearance:   , LOOKS DRY AND FATIGUED. OBESE  Head:    Normocephalic, without obvious abnormality, atraumatic  Eyes:    PERRL, conjunctiva/corneas clear,   Ears:    Normal external ear canals, both ears  Nose:   Nares normal, septum midline, mucosa normal, no drainage    or sinus tenderness  Throat:   Tongue is dry  Neck:   Supple, symmetrical, trachea midline, no adenopathy;    thyroid:  no enlargement/tenderness/nodules; no carotid   bruit or JVD  Back:     Symmetric, no  curvature, ROM normal, no CVA tenderness  Lungs:     Mild wheeze that is faint, respirations unlabored  Chest Wall:    No tenderness or deformity   Heart:    Regular rate and rhythm, S1 and S2 normal, no murmur, rub   or gallop  Breast Exam:    Not done  Abdomen:     Soft, non-tender, bowel sounds active all four quadrants,    no masses, no organomegaly  Genitalia:  Not done  Rectal:  Not done  Extremities:   Extremities normal, atraumatic, no cyanosis or edema  Pulses:   2+ and symmetric all extremities  Skin:   Skin color, texture, turgor mildly weak, no rashes or lesions  Lymph nodes:   Cervical, supraclavicular, and axillary nodes normal  Neurologic:  CNII-XII intact, normal strength, sensation and reflexes    throughout   LABS - this is soc labs since admit 11/13/2016 and pre-consent   PULMONARY No results for input(s): PHART, PCO2ART, PO2ART, HCO3, TCO2, O2SAT in the last 168 hours.  Invalid input(s): PCO2, PO2  CBC  Recent Labs Lab 11/13/16 1128 11/14/16 0500  HGB 14.1 12.2  HCT 41.5 37.0  WBC 8.1 6.4  PLT 196 161    COAGULATION No results for input(s): INR in the last 168 hours.  CARDIAC  No results for input(s): TROPONINI in the last 168 hours. No results for input(s): PROBNP in the last 168 hours.   CHEMISTRY  Recent Labs Lab 11/13/16 1128 11/14/16 0500  NA 133* 131*  K 4.3 4.2  CL 98* 99*  CO2 25 25  GLUCOSE 289* 279*  BUN 6 8  CREATININE 0.84 0.84  CALCIUM 8.7* 7.9*   Estimated Creatinine Clearance: 97 mL/min (by C-G formula based on SCr of 0.84 mg/dL).   LIVER  Recent Labs Lab 11/14/16 0500  AST 27  ALT 26  ALKPHOS 49  BILITOT 0.7  PROT 6.3*  ALBUMIN 3.3*     INFECTIOUS  Recent Labs Lab 11/13/16 1617  LATICACIDVEN 1.71     ENDOCRINE CBG (last 3)   Recent Labs  11/13/16 2129 11/14/16 0721 11/14/16 1202  GLUCAP 233* 305* 162*        Dg Chest 2 View  Result Date: 11/13/2016 CLINICAL DATA:  Productive  cough for 2 days. EXAM: CHEST  2 VIEW COMPARISON:  08/24/2015 FINDINGS: The heart size and mediastinal contours are within normal limits. Both lungs are clear. The visualized skeletal structures are unremarkable. IMPRESSION: No active cardiopulmonary disease. Electronically Signed   By: Kerby Moors M.D.   On: 11/13/2016 11:33   Ct Angio Chest Pe W Or Wo Contrast  Result Date: 11/13/2016 CLINICAL DATA:  Productive cough for 2 days. Chest pain with coughing. EXAM: CT ANGIOGRAPHY CHEST WITH CONTRAST TECHNIQUE: Multidetector CT imaging of the chest was performed using the standard protocol during bolus administration of intravenous contrast. Multiplanar CT image reconstructions and MIPs were obtained to evaluate the vascular anatomy. CONTRAST:  100 mL of Isovue 370 COMPARISON:  None. FINDINGS: Cardiovascular: The heart size is unchanged and unremarkable. The thoracic aorta is non aneurysmal with no dissection. Evaluation of pulmonary arteries is limited due to mild respiratory motion, artifact, and timing of contrast. Within this limitation, no pulmonary emboli identified. Mediastinum/Nodes: The 1.7 cm nodule in the right thyroid lobe is unchanged by my measurement. No effusions. The esophagus is normal. No adenopathy. Lungs/Pleura: Scattered subsegmental atelectasis is identified with no suspicious nodules, masses, or focal infiltrates. The central airways are normal. No pneumothorax. Upper Abdomen: No acute abnormality. Musculoskeletal: No chest wall abnormality. No acute or significant osseous findings. Review of the MIP images confirms the above findings. IMPRESSION: 1. Evaluation for pulmonary emboli is limited due to artifact and respiratory motion but no convincing pulmonary emboli are identified. 2. Stable 1.6 cm nodule in the right thyroid lobe. An ultrasound could better evaluate. Electronically Signed   By: Dorise Bullion III M.D   On: 11/13/2016 16:04   US Thyroid  Result Date:  11/14/2016 CLINICAL DATA:  Incidental on other study.  CT chest earlier today. EXAM: THYROID ULTRASOUND TECHNIQUE: Ultrasound examination of the thyroid gland and adjacent soft tissues was performed. COMPARISON:  None. FINDINGS: Parenchymal Echotexture: Mildly heterogenous Isthmus: 0.5 cm Right lobe: 4.9 x 2.0 x 2.0 cm. Left lobe: 4.3  x 1.5 x 1.6 cm. _________________________________________________________ Estimated total number of nodules >/= 1 cm: 1 Number of spongiform nodules >/=  2 cm not described below (TR1): 0 Number of mixed cystic and solid nodules >/= 1.5 cm not described below ([place]): Nodule # 1: Location: Right; Mid Maximum size: 2.4 cm; Other 2 dimensions: 1.7 x 1.4 cm Composition: mixed cystic and solid (1) Echogenicity: isoechoic (1) Shape: taller-than-wide (3) Margins: smooth (0) Echogenic foci: none (0) ACR TI-RADS total points: 5. ACR TI-RADS risk category: TR4 (4-6 points). ACR TI-RADS recommendations: **Given size (>/= 1.5 cm) and appearance, fine needle aspiration of this moderately suspicious nodule should be considered based on TI-RADS criteria. 0.8 cm left mid lobe hypoechoic nodule has a benign appearance. IMPRESSION: Bilateral nodules. The 2.4 cm right mid lobe nodule meets criteria for fine needle aspiration biopsy. The above is in keeping with the ACR TI-RADS recommendations - J Am Coll Radiol 2017;14:587-595. Electronically Signed   By: Marybelle Killings M.D.   On: 11/14/2016 07:43     A)  Patient Active Problem List   Diagnosis Date Noted  . Research subject 11/14/2016      . Respiratory failure with hypoxia ([place]) 11/13/2016      . Influenza A 11/13/2016      .        Marland Kitchen SIRS (systemic inflammatory response syndrome) ([place]) 11/13/2016    Priority: Medium     P) Continue Standard of Care per [facility] hospitalist Procedures per protocol Aim for randomization > 17.32h 11/14/2016 per [facility] the study sponsir -- drug high dose v drug lower dose v placebo     Dr. Brand Males, M.D., [facility].C.P Pulmonary and Critical Care Medicine Staff Physician [facility] Pager: [phone], If no answer or between  15:00h - 7:00h: call [phone]  11/14/2016 3:51 PM

## 2016-11-14 NOTE — Progress Notes (Signed)
PROGRESS NOTE   [name]  QTM:[id number] DOB: [birth year] DOA: 11/13/2016 PCP: Charolette Forward, PA-C  Brief Narrative:  Veronica [name] is a 56 y.o. female with medical history significant of Insulin Dependent Diabetes Mellitus Type 2 complicated by Diabetic Neuropathy in feet and hands, L4, L5 Bulging discs, Hx of OSA with nasal reconstruction,  Hx of Diverticulitis s/p Colon resection and reanastomosis of ileum and sigmoid colon, Hx of MI with no intervention and other comorbidites who presented to [facility] with a chief complaint worsening body aches and cough that started Tuesday. Cough has progressively gotten worse and is productive and cloudy/clear. Cough has increased since Tuesday and patient was going to come to ED yesterday but could not because of [place]. Has not had an appetitie but has been trying to rehydrate with po fluids. Patient states she has had headaches as well. Been around sick contact and was around them on Tuesday. She feels like she has been wheezing and has developed pain in chest from coughing. She has felt nauseous but has not vomited and this AM she developed a headache. States myalgias got so bad that she came in for evaluation. She presented to the ED and was found to be hypoxic on room air. TRH was asked to see patient and admit for Hypoxia and it was found that she was positive for Influenza A. Patient acutely worsened today and was enrolled in a Clinical Trial using Flu-IVIG.   Assessment & Plan:   Principal Problem:   Respiratory failure with hypoxia (HCC) Active Problems:   Diabetes mellitus (HCC)   Hypoxia   SIRS (systemic inflammatory response syndrome) (HCC)   Influenza A   Anxiety and depression   Thyroid nodule   Hyponatremia  1. Acute Hypoxic Respiratory Failure likely 2/2 to Influenza A -Changed to Inpatient as she acutely worsened  -Continuous Pulse Oximetry; Unable to wean off O2 as she dropped to 87% on Room Air -Supplemental O2 via [place];  Maintain O2 Saturations > 92% -DuoNeb Breathing Treatments TID -C/w Home BREO Ellipta -Initial CXR showed The heart size and mediastinal contours are within normal limits. Both lungs are clear. The visualized skeletal structures are unremarkable. -Respiratory Panel by PCR PENDING -PE R/o'd Out as Chest CT Angio showed Evaluation for pulmonary emboli is limited due to artifact and respiratory motion but no convincing pulmonary emboli are identified. -Repeat Chest X-Ray in AM once patient has had adequate IVF Rehydration -Will need Walk Screen Prior to D/C to evaluate for Home O2  2. SIRS 2/2 to Influenza A, worsening -Patient was Tachycardic, Tachypenic and Had Low Grade Fever on Admission; Had worsening Tachycardia, Tachypenia and Fever today  -No blood Cx were obtained by EDP; CXR Negative, Urinalysis Negative -Will obtain Blood Cx's as she is worsening and concern for becoming septic from possible secondary bacterial Infection -Has Significant Myalgias -Supportive Care with Oseltamivir 75 mg BID x 5 days -IVF Rehydration with NS at 75 mL/hr -Acetaminophen and Tramadol for Myalgia pain; May need Robaxin; Received Ketorolac in the ED -Continue Mucinex DM 1 tab po BID -**Patient Involved in FLU-IVIG Clinical Trial today because she met inclusion criteria.   3. Thyroid Nodule -CT Angio showed Stable 1.6 cm nodule in the right thyroid lobe.  -Ultrasound of the Thyroid showed Bilateral nodules. The 2.4 cm right mid lobe nodule meets criteria for fine needle aspiration biopsy. -TSH was 0.340 and Free T4 was 1.03 -Will need outpatient Bx as will not do it this visit because of  her clinical illness  4. Insulin Dependent Diabetes Mellitus Type 2 complicated by Diabetic Neuropathy -Hold Home Saxagliptin 5 mg po daily and home Insulin NPH -Lantus 20 Units sq qHS increased to 35 Units sq qHS per Diabetic Coordinator Reccommendation and Continue Moderate Novolog SSI -Check HbA1c; HbA1c was 9.2  in 2010 -Continue to Monitor CBG's closely; Ranging from 162 - 305 -UA showed >500 Urine Glucose -C/w Home Gabapentin 400 mg po TID] -Will consider giving copay Card for Lantus Solostar pen so she can get Lantus for $10 a month at D/C  5. Depression/Anxiety  -C/w Home Citalopram 40 mg po Daily and Home Clonazepam 1 mg po BID  6. Lumbar Disc Disease -C/w Cyclobenzaprine and Tramadol for Pain from Bulging discs   7. Hyponatremia -Mild at 131; Worsen than yesterday (133) -C/w IVF Rehydration and Repeat CMP in AM  DVT prophylaxis: Lovenox 60 mg sq Code Status: FULL CODE Family Communication: No Family present at bedside Disposition Plan: Remain Inpatient until Clinical Status is improving  Consultants: None   Procedures: None  Antimicrobials:  Anti-infectives    Start     Dose/Rate Route Frequency Ordered Stop   11/13/16 2200  oseltamivir (TAMIFLU) capsule 75 mg     75 mg Oral 2 times daily 11/13/16 1908 11/18/16 2159     Subjective: Patient was doing worse today and spiked a temperature last night. Complaining of significant pain in ribs from coughing. Had a rough night. No Nausea today but not eating very much. No other complaints except that she is physically exhausted.   Objective: Vitals:   11/14/16 1000 11/14/16 1006 11/14/16 1042 11/14/16 1424  BP:    120/72  Pulse:    86  Resp:    18  Temp:    (!) 100.4 F (38 C)  TempSrc:    Axillary  SpO2: (!) 87% 95% 93% 98%  Weight:      Height:        Intake/Output Summary (Last 24 hours) at 11/14/16 1431 Last data filed at 11/14/16 1226  Gross per 24 hour  Intake             2145 ml  Output                0 ml  Net             2145 ml   Filed Weights   11/13/16 1106 11/13/16 1910 11/14/16 0618  Weight: 116.6 kg (257 lb) 119.7 kg (263 lb 14.3 oz) 120 kg (264 lb 8.8 oz)   Examination: Physical Exam:  Constitutional: WN/WD,obese AAF who is ill appearing and worse than yesterday Eyes: Lids and conjunctivae  normal, sclerae anicteric  ENMT: External Ears, Nose appear normal. Grossly normal hearing. Mucous membranes slightly dry. Neck: Appears normal, supple,   Respiratory: Diminished but clear to auscultation bilaterally, no wheezing, rales, rhonchi or crackles. Increased respiratory effort but No accessory muscle use. Wearing supplemental O2 via . Cardiovascular: RRR, no murmurs / rubs / gallops. S1 and S2 auscultated. No extremity edema. 2+ pedal pulses.  Abdomen: Soft, non-tender, non-distended. No masses palpated. No appreciable hepatosplenomegaly. Bowel sounds positive x4.  GU: Deferred. Musculoskeletal: No clubbing / cyanosis of digits/nails. No joint deformity upper and lower extremities. No Contractures Skin: No rashes, lesions, ulcers on limited skin evaluation. No induration; Warm and dry.  Neurologic: CN 2-12 grossly intact with no focal deficits.  Romberg sign cerebellar reflexes not assessed.  Psychiatric: Normal judgment and insight. Alert and oriented  x 3. Normal mood and appropriate affect.   Labs on Admission: I have personally reviewed following labs and imaging studies  Data Reviewed: I have personally reviewed following labs and imaging studies  CBC:  Recent Labs Lab 11/13/16 1128 11/14/16 0500  WBC 8.1 6.4  HGB 14.1 12.2  HCT 41.5 37.0  MCV 87.2 87.1  PLT 196 450   Basic Metabolic Panel:  Recent Labs Lab 11/13/16 1128 11/14/16 0500  NA 133* 131*  K 4.3 4.2  CL 98* 99*  CO2 25 25  GLUCOSE 289* 279*  BUN 6 8  CREATININE 0.84 0.84  CALCIUM 8.7* 7.9*   GFR: Estimated Creatinine Clearance: 97 mL/min (by C-G formula based on SCr of 0.84 mg/dL). Liver Function Tests:  Recent Labs Lab 11/14/16 0500  AST 27  ALT 26  ALKPHOS 49  BILITOT 0.7  PROT 6.3*  ALBUMIN 3.3*   No results for input(s): LIPASE, AMYLASE in the last 168 hours. No results for input(s): AMMONIA in the last 168 hours. Coagulation Profile: No results for input(s): INR, PROTIME in  the last 168 hours. Cardiac Enzymes: No results for input(s): CKTOTAL, CKMB, CKMBINDEX, TROPONINI in the last 168 hours. BNP (last 3 results) No results for input(s): PROBNP in the last 8760 hours. HbA1C: No results for input(s): HGBA1C in the last 72 hours. CBG:  Recent Labs Lab 11/13/16 1333 11/13/16 2129 11/14/16 0721 11/14/16 1202  GLUCAP 282* 233* 305* 162*   Lipid Profile: No results for input(s): CHOL, HDL, LDLCALC, TRIG, CHOLHDL, LDLDIRECT in the last 72 hours. Thyroid Function Tests:  Recent Labs  11/14/16 0500  TSH 0.340*  FREET4 1.03   Anemia Panel: No results for input(s): VITAMINB12, FOLATE, FERRITIN, TIBC, IRON, RETICCTPCT in the last 72 hours. Sepsis Labs:  Recent Labs Lab 11/13/16 1617  LATICACIDVEN 1.71   No results found for this or any previous visit (from the past 240 hour(s)).   Radiology Studies: Dg Chest 2 View  Result Date: 11/13/2016 CLINICAL DATA:  Productive cough for 2 days. EXAM: CHEST  2 VIEW COMPARISON:  08/24/2015 FINDINGS: The heart size and mediastinal contours are within normal limits. Both lungs are clear. The visualized skeletal structures are unremarkable. IMPRESSION: No active cardiopulmonary disease. Electronically Signed   By: Kerby Moors M.D.   On: 11/13/2016 11:33   Ct Angio Chest Pe W Or Wo Contrast  Result Date: 11/13/2016 CLINICAL DATA:  Productive cough for 2 days. Chest pain with coughing. EXAM: CT ANGIOGRAPHY CHEST WITH CONTRAST TECHNIQUE: Multidetector CT imaging of the chest was performed using the standard protocol during bolus administration of intravenous contrast. Multiplanar CT image reconstructions and MIPs were obtained to evaluate the vascular anatomy. CONTRAST:  100 mL of Isovue 370 COMPARISON:  None. FINDINGS: Cardiovascular: The heart size is unchanged and unremarkable. The thoracic aorta is non aneurysmal with no dissection. Evaluation of pulmonary arteries is limited due to mild respiratory motion,  artifact, and timing of contrast. Within this limitation, no pulmonary emboli identified. Mediastinum/Nodes: The 1.7 cm nodule in the right thyroid lobe is unchanged by my measurement. No effusions. The esophagus is normal. No adenopathy. Lungs/Pleura: Scattered subsegmental atelectasis is identified with no suspicious nodules, masses, or focal infiltrates. The central airways are normal. No pneumothorax. Upper Abdomen: No acute abnormality. Musculoskeletal: No chest wall abnormality. No acute or significant osseous findings. Review of the MIP images confirms the above findings. IMPRESSION: 1. Evaluation for pulmonary emboli is limited due to artifact and respiratory motion but no convincing pulmonary emboli  are identified. 2. Stable 1.6 cm nodule in the right thyroid lobe. An ultrasound could better evaluate. Electronically Signed   By: Dorise Bullion III M.D   On: 11/13/2016 16:04   US Thyroid  Result Date: 11/14/2016 CLINICAL DATA:  Incidental on other study.  CT chest earlier today. EXAM: THYROID ULTRASOUND TECHNIQUE: Ultrasound examination of the thyroid gland and adjacent soft tissues was performed. COMPARISON:  None. FINDINGS: Parenchymal Echotexture: Mildly heterogenous Isthmus: 0.5 cm Right lobe: 4.9 x 2.0 x 2.0 cm. Left lobe: 4.3 x 1.5 x 1.6 cm. _________________________________________________________ Estimated total number of nodules >/= 1 cm: 1 Number of spongiform nodules >/=  2 cm not described below (TR1): 0 Number of mixed cystic and solid nodules >/= 1.5 cm not described below (TR2): Nodule # 1: Location: Right; Mid Maximum size: 2.4 cm; Other 2 dimensions: 1.7 x 1.4 cm Composition: mixed cystic and solid (1) Echogenicity: isoechoic (1) Shape: taller-than-wide (3) Margins: smooth (0) Echogenic foci: none (0) ACR TI-RADS total points: 5. ACR TI-RADS risk category: TR4 (4-6 points). ACR TI-RADS recommendations: **Given size (>/= 1.5 cm) and appearance, fine needle aspiration of this moderately  suspicious nodule should be considered based on TI-RADS criteria. 0.8 cm left mid lobe hypoechoic nodule has a benign appearance. IMPRESSION: Bilateral nodules. The 2.4 cm right mid lobe nodule meets criteria for fine needle aspiration biopsy. The above is in keeping with the ACR TI-RADS recommendations - J Am Coll Radiol 2017;14:587-595. Electronically Signed   By: Marybelle Killings M.D.   On: 11/14/2016 07:43   Scheduled Meds: . citalopram  40 mg Oral Daily  . dextromethorphan-guaiFENesin  1 tablet Oral BID  . enoxaparin (LOVENOX) injection  0.5 mg/kg Subcutaneous Q24H  . fluticasone furoate-vilanterol  1 puff Inhalation Daily  . gabapentin  400 mg Oral TID  . insulin aspart  0-15 Units Subcutaneous TID WC  . insulin glargine  35 Units Subcutaneous QHS  . ipratropium-albuterol  3 mL Nebulization TID  . oseltamivir  75 mg Oral BID  . sodium chloride flush  3 mL Intravenous Q12H   Continuous Infusions: . sodium chloride 75 mL/hr at 11/14/16 1121    LOS: 0 days   Kerney Elbe, DO [facility] Pager [phone]  If 7PM-7AM, please contact night-coverage [email or web address] Password [facility] 11/14/2016, 2:31 PM

## 2016-11-14 NOTE — Progress Notes (Signed)
Title: A Randomized, Double-Blind, Placebo-Controlled Dose Ranging Study Evaluating the Safety Pharmacokinetics and Clinical Benefit of FLU-IGIV in Hospitalized Patients with Serious Influenza A infection. IA-001 (ClinicalTrials.gov Identifier: [id number], Protocol No: IA-001, [facility] Protocol #[id number])  RESEARCH SUBJECT. This research study is sponsored by [employer]   The investigational product is called NP-025 aka FLU-IGIV or anti-influenza immune globulin intravenous. It is produced from source plasma collected from [place] Education officer, environmental (FDA) licensed plasma collection establishments from healthy donors who have recovered from influenza (convalescent) and/or were vaccinated against seasonal influenza strains. The plasma contains a relatively high concentration of polyclonal antibodies directed against seasonal influenza strains, specifically influenza A strains H1N1 ([place], [place]) and H3N2 ([place]). It is a glycoprotein of 150-160 kilodaltons against Hemagluttinin (HA) and Neuraminidase (NA) surface proteins.   Key Inclusion Criteria: Adult patients; locally determined positive influenza A infection (Rapid Antigen Test or PCR) from a specimen obtained within 2 days prior to randomization; onset of symptoms </= 6 days prior to randomization; experiencing >/= 1 respiratory symptom (cough, sore throat, nasal congestion) and >/= 1 constitutional symptom (headache, myalgia, feverishness or fatigue); NEW score >/=3 at screening; must have an active order for a minimum 5 day course of oseltamivir (75mg /twice daily).  Key Exclusion Criteria: History of hypersensitivity to blood or plasma products; history of allergy to latex or rubber; pregnancy or lactation; known IgA deficiency; medical conditions for which receipt of a 500 mL volume of IV fluid may be dangerous; a pre-existing condition or use of medication that may place the individual at a  substantially increased risk of thrombosis; anticipated life expectancy < 90 days; confirmed bacterial pneumonia or any concurrent respiratory viral infection that is not influenza A.  Key other data: Side effects with infusion  Incidence Occurrence Side effect  Common 1-15%, typically < 5% First 30 minutes of infusion Back pain, abdominal pain, nausea, vomiting  Common 1-15%, typically < 5% First few minutes to several hours after infusion Chills, fever, headache, myalgia, fatigue  Rare Seconds to several hours after infusion Hypersensitivity reaction (happens with IgA deficiency), flushing, facial swelling, dyspnea, cyanosis, shock, cardiac arrest, pneumonitis, renal failure, aseptic meningitis, hemolysis, viral infection  Rare Seconds to several hours after infusion Thrombosis (at risk patients are those with a history of DVT, arterial thrombosis, multiple cardiovascular risk factors, impaired cardiac output, advanced age, coagulation disorders, prolonged immobilization, use of estrogen, indwelling catheters, known/suspected hyperviscosity)  Recommendation: For patients who are at risk of developing thrombotic events, administer NP-025 at the minimum rate of infusion practicable, not to exceed 2 mL/min. Ensure adequate hydration in patients before administration. Monitor  Rare 1-6 hours after infusion Transfusion related Acute Lung Injury (TRALI)  Rare  Hemolysis (dose related, happens in total > 2g/kg and non-O blood group), severe hemolysis may lead to renal dysfunction/failure.  Recommendation: Check hemoglobin pre- and post 36-96h and 7-10 days transfusion  Rare Several hours to two  days after infusion Aseptic Meningitis (AMS) (dose related, happens in total > 2g/kg)   NOTE: NP-025 contains 10% maltose - will interfere with glucose measurements if non-glucose specific measurement devices are not used. Results in false high glucose levels can  result in increased insulin -> life threatening  hypoglycemia  ...................................................................................................  Clinical Research Coordinator / Research RN note : This visit for Subject Veronica [name] with 06-Jul-1961 on 11/14/2016 for the above protocol is Visit/Encounter Day 1  and is for purpose of Screening/Baseline/Pre-dose/Infusion/Post-Dose . The consent for this encounter is  under Protocol Version 2.0 dated 28Sep17 and is currently IRB approved. Subject/LAR expressed continued interest and consent in continuing as a study subject.  In this visit 11/14/2016 the subject will be evaluated by investigator named Kalman ShanMurali Ramaswamy, MD. This research coordinator has verified that the investigator is up to date with his training logs.  Mrs. Veronica [name] has been enrolled into the above protocol. All screening procedures and baseline procedures have been completed after informed consent was obtained. Subject was randomized into study and received study drug/placebo on 11/14/2016 at 19:30. This RN present at bedside to initiate infusion at 1 mL/min per study protocol. Study treatment administered under the direct supervision of the investigator Kalman ShanMurali Ramaswamy, MD for the first 30 minutes of the infusion to monitor infusion startup.  Patient tolerating infusion well at this time, and vital signs stable. After first 30 minutes of infusion patient reports no new changes in health or medications and reports feeling well other than ongoing headache/muscle aches unchanged from baseline. Offered PRN pain medication, however patient stated she would wait until her night time medications were due. Infusion titrated up per study protocol. Towards end of infusion at 21:45 patient called out stating her IV was beeping. Patient states IV was beeping for less than a few seconds at this time.This RN was outside of room and immediately at bedside to assess IV site. An estimated 2-3 drops of fluid leaked onto patient's gown (about  a quarter sized wet spot). Spoke with PI Ramaswamy, and it was agreed upon that this did not compromise dose integrity of study drug/placebo. Infusion moved to second peripheral IV site, which was already in place. Bedside RN made aware. Study medication ended at 10:10 PM. This RN at bedside to assess patient at end of infusion. Patient reports no new changes in health or medications, vital signs stable, headache/muscle aches ongoing. Per patient does not believe related to study medication as it is unchanged from baseline. Bedside RN made aware for PRN pain meds. Post-Infusion procedures completed per protocol. Will follow up tomorrow to complete follow up assessments/procedures. Subject was thanked for their participation in research and contribution to science.  Veronica MoosFrances J Mayetta Castleman, RN Clinical Research Nurse  [facility] [place] Office: [phone] Pager: [phone] 11:39 PM 11/14/2016

## 2016-11-14 NOTE — Progress Notes (Addendum)
Inpatient Diabetes Program Recommendations  AACE/ADA: New Consensus Statement on Inpatient Glycemic Control (2015)  Target Ranges:  Prepandial:   less than 140 mg/dL      Peak postprandial:   less than 180 mg/dL (1-2 hours)      Critically ill patients:  140 - 180 mg/dL  Results for Veronica [name], Peyten A (MRN [id number]) as of 11/14/2016 09:27  Ref. Range 11/13/2016 13:33 11/13/2016 21:29 11/14/2016 07:21  Glucose-Capillary Latest Ref Range: 65 - 99 mg/dL 409282 (H) 811233 (H) 914305 (H)   Review of Glycemic Control  Diabetes history: DM 2 Outpatient Diabetes medications: Onglyza 5 mg Daily, NPH 30 units BID Current orders for Inpatient glycemic control: Lantus 20, Novolog Moderate TID  Inpatient Diabetes Program Recommendations:   Consult for hyperglycemia. Due to patient's weight and current glucose level, 305 mg/dl, please consider increasing Lantus to 35 units QHS.   Note patient reports not being able to afford Novolin N (NPH) insulin at home due to copay per CM note. Patient has [employer] insurance and if switched to PraxairLantus Solostar pen, could give copay card with her commercial insurance to get Lantus for $10/month.  Thanks,  Christena DeemShannon Yzabelle Calles RN, MSN, [facility] Inpatient Diabetes Coordinator Team Pager [phone] (8a-5p)

## 2016-11-15 DIAGNOSIS — I472 Ventricular tachycardia: Secondary | ICD-10-CM

## 2016-11-15 LAB — CBC WITH DIFFERENTIAL/PLATELET
BASOS ABS: 0 10*3/uL (ref 0.0–0.1)
BASOS ABS: 0 10*3/uL (ref 0.0–0.1)
Basophils Relative: 0 %
Basophils Relative: 0 %
EOS ABS: 0 10*3/uL (ref 0.0–0.7)
EOS PCT: 0 %
Eosinophils Absolute: 0 10*3/uL (ref 0.0–0.7)
Eosinophils Relative: 0 %
HCT: 36.2 % (ref 36.0–46.0)
HEMATOCRIT: 34.5 % — AB (ref 36.0–46.0)
HEMOGLOBIN: 11.2 g/dL — AB (ref 12.0–15.0)
Hemoglobin: 11.8 g/dL — ABNORMAL LOW (ref 12.0–15.0)
LYMPHS PCT: 38 %
Lymphocytes Relative: 29 %
Lymphs Abs: 1.1 10*3/uL (ref 0.7–4.0)
Lymphs Abs: 1.3 10*3/uL (ref 0.7–4.0)
MCH: 29.2 pg (ref 26.0–34.0)
MCH: 29 pg (ref 26.0–34.0)
MCHC: 32.6 g/dL (ref 30.0–36.0)
MCHC: 32.5 g/dL (ref 30.0–36.0)
MCV: 89.6 fL (ref 78.0–100.0)
MCV: 89.4 fL (ref 78.0–100.0)
MONO ABS: 0.7 10*3/uL (ref 0.1–1.0)
Monocytes Absolute: 0.5 10*3/uL (ref 0.1–1.0)
Monocytes Relative: 18 %
Monocytes Relative: 14 %
NEUTROS ABS: 1.7 10*3/uL (ref 1.7–7.7)
NEUTROS PCT: 48 %
Neutro Abs: 2 10*3/uL (ref 1.7–7.7)
Neutrophils Relative %: 53 %
PLATELETS: 163 10*3/uL (ref 150–400)
Platelets: 164 10*3/uL (ref 150–400)
RBC: 4.04 MIL/uL (ref 3.87–5.11)
RBC: 3.86 MIL/uL — AB (ref 3.87–5.11)
RDW: 12.5 % (ref 11.5–15.5)
RDW: 12.7 % (ref 11.5–15.5)
WBC: 3.8 10*3/uL — AB (ref 4.0–10.5)
WBC: 3.6 10*3/uL — AB (ref 4.0–10.5)

## 2016-11-15 LAB — RESPIRATORY PANEL BY PCR
ADENOVIRUS-RVPPCR: NOT DETECTED
Bordetella pertussis: NOT DETECTED
CHLAMYDOPHILA PNEUMONIAE-RVPPCR: NOT DETECTED
CORONAVIRUS 229E-RVPPCR: NOT DETECTED
CORONAVIRUS HKU1-RVPPCR: NOT DETECTED
CORONAVIRUS NL63-RVPPCR: NOT DETECTED
Coronavirus OC43: NOT DETECTED
INFLUENZA A H3-RVPPCR: DETECTED — AB
Influenza B: NOT DETECTED
MYCOPLASMA PNEUMONIAE-RVPPCR: NOT DETECTED
Metapneumovirus: NOT DETECTED
Parainfluenza Virus 1: NOT DETECTED
Parainfluenza Virus 2: NOT DETECTED
Parainfluenza Virus 3: NOT DETECTED
Parainfluenza Virus 4: NOT DETECTED
Respiratory Syncytial Virus: NOT DETECTED
Rhinovirus / Enterovirus: NOT DETECTED

## 2016-11-15 LAB — PROTIME-INR
INR: 0.96
Prothrombin Time: 12.7 seconds (ref 11.4–15.2)

## 2016-11-15 LAB — COMPREHENSIVE METABOLIC PANEL
ALBUMIN: 3 g/dL — AB (ref 3.5–5.0)
ALT: 25 U/L (ref 14–54)
ALT: 22 U/L (ref 14–54)
ANION GAP: 5 (ref 5–15)
AST: 27 U/L (ref 15–41)
AST: 26 U/L (ref 15–41)
Albumin: 2.9 g/dL — ABNORMAL LOW (ref 3.5–5.0)
Alkaline Phosphatase: 53 U/L (ref 38–126)
Alkaline Phosphatase: 49 U/L (ref 38–126)
Anion gap: 5 (ref 5–15)
BILIRUBIN TOTAL: 0.4 mg/dL (ref 0.3–1.2)
BUN: 8 mg/dL (ref 6–20)
BUN: 7 mg/dL (ref 6–20)
CALCIUM: 7.9 mg/dL — AB (ref 8.9–10.3)
CHLORIDE: 100 mmol/L — AB (ref 101–111)
CO2: 30 mmol/L (ref 22–32)
CO2: 28 mmol/L (ref 22–32)
CREATININE: 0.65 mg/dL (ref 0.44–1.00)
Calcium: 7.7 mg/dL — ABNORMAL LOW (ref 8.9–10.3)
Chloride: 100 mmol/L — ABNORMAL LOW (ref 101–111)
Creatinine, Ser: 0.71 mg/dL (ref 0.44–1.00)
GFR calc Af Amer: >60 mL/min (ref >60)
GFR calc non Af Amer: >60 mL/min (ref >60)
GFR calc non Af Amer: >60 mL/min (ref >60)
GLUCOSE: 182 mg/dL — AB (ref 65–99)
Glucose, Bld: 151 mg/dL — ABNORMAL HIGH (ref 65–99)
POTASSIUM: 4.5 mmol/L (ref 3.5–5.1)
POTASSIUM: 3.9 mmol/L (ref 3.5–5.1)
SODIUM: 135 mmol/L (ref 135–145)
SODIUM: 133 mmol/L — AB (ref 135–145)
TOTAL PROTEIN: 6.4 g/dL — AB (ref 6.5–8.1)
Total Bilirubin: 0.2 mg/dL — ABNORMAL LOW (ref 0.3–1.2)
Total Protein: 5.9 g/dL — ABNORMAL LOW (ref 6.5–8.1)

## 2016-11-15 LAB — TROPONIN I
Troponin I: <0.03 ng/mL (ref <0.03)
Troponin I: <0.03 ng/mL (ref <0.03)

## 2016-11-15 LAB — GLUCOSE, CAPILLARY
GLUCOSE-CAPILLARY: 223 mg/dL — AB (ref 65–99)
GLUCOSE-CAPILLARY: 173 mg/dL — AB (ref 65–99)
Glucose-Capillary: 178 mg/dL — ABNORMAL HIGH (ref 65–99)
Glucose-Capillary: 158 mg/dL — ABNORMAL HIGH (ref 65–99)

## 2016-11-15 LAB — HEMOGLOBIN A1C
Hgb A1c MFr Bld: 10.1 % — ABNORMAL HIGH (ref 4.8–5.6)
MEAN PLASMA GLUCOSE: 243 mg/dL

## 2016-11-15 LAB — APTT: aPTT: 34 seconds (ref 24–36)

## 2016-11-15 LAB — MAGNESIUM: Magnesium: 1.9 mg/dL (ref 1.7–2.4)

## 2016-11-15 LAB — PHOSPHORUS: Phosphorus: 3.1 mg/dL (ref 2.5–4.6)

## 2016-11-15 MED ORDER — INSULIN GLARGINE 100 UNIT/ML ~~LOC~~ SOLN
36.0000 [IU] | Freq: Every day | SUBCUTANEOUS | Status: DC
  Administered 2016-11-15 – 2016-11-16 (×2): 36 [IU] via SUBCUTANEOUS
  Filled 2016-11-15 (×3): qty 0.36

## 2016-11-15 NOTE — Progress Notes (Signed)
Inpatient Diabetes Program Recommendations  AACE/ADA: New Consensus Statement on Inpatient Glycemic Control (2015)  Target Ranges:  Prepandial:   less than 140 mg/dL      Peak postprandial:   less than 180 mg/dL (1-2 hours)      Critically ill patients:  140 - 180 mg/dL   Lab Results  Component Value Date   GLUCAP 178 (H) 11/15/2016   HGBA1C 10.1 (H) 11/14/2016    Review of Glycemic Control  Results for Veronica [name], Courtnei A (MRN [id number]) as of 11/15/2016 09:14  Ref. Range 11/14/2016 12:02 11/14/2016 16:25 11/14/2016 21:21 11/14/2016 22:59 11/15/2016 07:34  Glucose-Capillary Latest Ref Range: 65 - 99 mg/dL 409162 (H) 811175 (H) 914195 (H) 191 (H) 178 (H)   Diabetes history: DM 2 Outpatient Diabetes medications: Onglyza 5 mg Daily, NPH 30 units BID Current orders for Inpatient glycemic control: Lantus 20, Novolog Moderate TID  Inpatient Diabetes Program Recommendations:   Lantus  Increased to 35 units QHS last night; fasting blood sugar much improved. Consider increasing Lantus insulin to 36 units.   Patient reports not being able to afford Novolin N (NPH) insulin at home due to copay per CM note. Spoke to patient by phone today who tells me she did try and get assistance for her medications but she's not sure if it was for the Lantus solostar.   After discharge, this patient should look into the possibility of getting a copay card with her commercial insurance to get Lantus for $10/month.  For now, at discharge, I recommend the patient purchase the [employer] brand Reli-on NPH insulin which she can get for $25.00 per bottle. I have discussed this with the patient- she was appreciative of the information.   Susette RacerJulie Azavion Bouillon, RN, BA, MHA, CDE Diabetes Coordinator Inpatient Diabetes Program  [phone] (Team Pager)  11/15/2016 9:25 AM

## 2016-11-15 NOTE — Progress Notes (Addendum)
Title: A Randomized, Double-Blind, Placebo-Controlled Dose Ranging Study Evaluating the Safety Pharmacokinetics and Clinical Benefit of FLU-IGIV in Hospitalized Patients with Serious Influenza A infection. IA-001 (ClinicalTrials.gov Identifier: [id number], Protocol No: IA-001, [facility] Protocol #[id number])  RESEARCH SUBJECT. This research study is sponsored by [employer]  ...................................................................................................  PI NOTE - evaluation 24h post infusion - this is "Day 2" evaluation  S: personally evaluated subject face to face. Reports feeling better overal esp myalgia. Says pre-infusion myalgia was 15/10. Now 9/10. Fatigue is moderate and unchanged. However, post infusion (ended 10.10pm approx) and  around 12.30am 15-Nov-2016 headache went from 2-5/10   To and got worse 12 /10. Addressed this morning nursing shift by and on round the clock with tramadol/tylenol alternating and now headache is much better at 5/10. Also on chronic klonopin, flexeril and gabapentin.  Denies associated nausea, vomit, diplopia,.   Wants to eat steak  O Vitals:   11/15/16 0810 11/15/16 0816 11/15/16 1300 11/15/16 1707  BP:  140/67 116/71 107/69  Pulse:  79 70 78  Resp: (!) 22 20 18 16   Temp:  99.5 F (37.5 C) 99.1 F (37.3 C) 98.7 F (37.1 C)  TempSrc:  Oral Oral Oral  SpO2: (!) 85% 93% 100% 98%  Weight:      Height:       8.10am vitals is on room air 17.07 vitals is on [facility] 6:19 PM  Pulse ox on room air x on room air 96%  EXAM BP 107/69 (BP Location: Right Wrist)   Pulse 78   Temp 98.7 F (37.1 C) (Oral)   Resp 16   Ht 5\' 5"  (1.651 m)   Wt 120 kg (264 lb 8.8 oz)   SpO2 98%   BMI 44.02 kg/m   General Appearance:    Alert, cooperative, no distress, appears stated age. OBESE. LOOKS MUCH BETTER  Head:    Normocephalic, without obvious abnormality, atraumatic  Eyes:    PERRL, conjunctiva/corneas clear, EOM's intact,    Ears:    Normal  external ear canals, both ears  Nose:   Nares normal, septum midline, mucosa normal, no drainage    or sinus tenderness  Throat:   Lips, mucosa, and tongue normal; teeth and gums normal  Neck:   Supple, symmetrical, trachea midline, no adenopathy;    thyroid:  no enlargement/tenderness/nodules; no carotid   bruit or JVD  Back:     Symmetric, no curvature, ROM normal, no CVA tenderness  Lungs:     Clear to auscultation bilaterally, respirations unlabored. NO WHEEZE  Chest Wall:    No tenderness or deformity   Heart:    Regular rate and rhythm, S1 and S2 normal, no murmur, rub   or gallop  Breast Exam:  Not done  Abdomen:     Soft, non-tender, bowel sounds active all four quadrants,    no masses, no organomegaly  Genitalia:  Not done  Rectal:  Not done  Extremities:   Extremities normal, atraumatic, no cyanosis or edema  Pulses:   2+ and symmetric all extremities  Skin:   Skin color, texture, turgor normal, no rashes or lesions  Lymph nodes:   Cervical, supraclavicular, and axillary nodes normal  Neurologic:   CNII-XII intact, normal strength, sensation and reflexes    throughout    LABS PULMONARY No results for input(s): PHART, PCO2ART, PO2ART, HCO3, TCO2, O2SAT in the last 168 hours.  Invalid input(s): PCO2, PO2  CBC  Recent Labs Lab 11/14/16 1623 11/15/16 0608 11/15/16  1655  HGB 11.7* 11.8* 11.2*  HCT 35.8* 36.2 34.5*  WBC 5.0 3.8* 3.6*  PLT 152 163 164    COAGULATION  Recent Labs Lab 11/14/16 1623  INR 1.10    CARDIAC   Recent Labs Lab 11/14/16 1623 11/15/16 0608 11/15/16 1244  TROPONINI <0.03 <0.03 <0.03   No results for input(s): PROBNP in the last 168 hours.   CHEMISTRY  Recent Labs Lab 11/13/16 1128 11/14/16 0500 11/14/16 1623 11/15/16 0608 11/15/16 1655  NA 133* 131* 135 135 133*  K 4.3 4.2 3.9 4.5 3.9  CL 98* 99* 101 100* 100*  CO2 25 25 30 30 28   GLUCOSE 289* 279* 175* 182* 151*  BUN 6 8 8 8 7   CREATININE 0.84  0.84 0.72 0.71 0.65  CALCIUM 8.7* 7.9* 7.8* 7.9* 7.7*  MG  --   --  1.8 1.9  --   PHOS  --   --  2.6 3.1  --    Estimated Creatinine Clearance: 101.9 mL/min (by C-G formula based on SCr of 0.65 mg/dL).   LIVER  Recent Labs Lab 11/14/16 0500 11/14/16 1623 11/15/16 0608 11/15/16 1655  AST 27 31 27 26   ALT 26 26 25 22   ALKPHOS 49 55 53 49  BILITOT 0.7 0.5 0.4 0.2*  PROT 6.3* 6.3* 6.4* 5.9*  ALBUMIN 3.3* 3.2* 3.0* 2.9*  INR  --  1.10  --   --      INFECTIOUS  Recent Labs Lab 11/13/16 1617  LATICACIDVEN 1.71     ENDOCRINE CBG (last 3)   Recent Labs  11/15/16 0734 11/15/16 1144 11/15/16 1741  GLUCAP 178* 223* 158*         IMAGING x48h  - image(s) personally visualized  -   highlighted in bold    A) Research patient - s/ iv IG v placebo against flu A 11/14/16 Flu A  Acute hypoxemic resp failure - improved - room air pulse ox 96% onroom air Flu related myalgia - improving Flu related headache - worsened post study drug and then improved with medications.   - STill not at baseline but neuro intact            - Adverse Effect (AE)  - severity scale: severe. Relationship: Flu related and made worse by study drug   - course : improving  - significance: clinically significant but no neuro sequelae  - d/w hospitalist MD and bedside RN: any worsening or any neuro change - to call investigator or CCM MD or consider getting head CT  Leuokpenia - new - post study drug - mild. Not clinically significant  Rest   - anemia of acute illness, mild hyponatremia - all baseline unchanged    Dr. Kalman ShanMurali Fahim Kats, M.D., [facility]C.P Pulmonary and Critical Care Medicine Staff Physician [facility] Pager: [phone], If no answer or between  15:00h - 7:00h: call [phone]  11/15/2016 6:25 PM

## 2016-11-15 NOTE — Progress Notes (Signed)
PROGRESS NOTE   Veronica [name]  TDD:[id number] DOB: October 21, 1961 DOA: 11/13/2016 PCP: Charolette Forward, PA-C  Brief Narrative:  Veronica [name] is a 56 y.o. 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 Cough has progressively gotten worse and is productive and cloudy/clear. Cough has increased since Tuesday and patient was going to come to ED yesterday but could not because of [place]. Has not had an appetitie but has been trying to rehydrate with po fluids. Patient states she has had headaches as well. Been around sick contact and was around them on Tuesday. She feels like she has been wheezing and has developed pain in chest from coughing. She has felt nauseous but has not vomited and this AM she developed a headache. States myalgias got so bad that she came in for evaluation. She presented to the ED and was found to be hypoxic on room air. TRH was asked to see patient and admit for Hypoxia and it was found that she was positive for Influenza A. Patient acutely worsened 11/14/16 and was enrolled in a Clinical Trial using Flu-IVIG. States she still feels bad today but a little bit better. Still unable to wean from O2 currently.   Assessment & Plan:   Principal Problem:   Influenza A Active Problems:   Diabetes mellitus ([place])   Hypoxia   Respiratory failure with hypoxia (HCC)   SIRS (systemic inflammatory response syndrome) (HCC)   Anxiety and depression   Thyroid nodule   Hyponatremia   Research subject  1. Acute Hypoxic Respiratory Failure likely 2/2 to Influenza A -Changed to Inpatient as she acutely  worsened  -Continuous Pulse Oximetry; Still Unable to wean off O2 as was 85% on Room Air -Supplemental O2 via [place]; Maintain O2 Saturations > 92% -DuoNeb Breathing Treatments TID -C/w Home BREO Ellipta -Initial CXR showed The heart size and mediastinal contours are within normal limits. Both lungs are clear. The visualized skeletal structures are unremarkable. -Respiratory Panel by PCR PENDING -PE R/o'd Out as Chest CT Angio showed Evaluation for pulmonary emboli is limited due to artifact and respiratory motion but no convincing pulmonary emboli are identified. -Repeat 2 View Chest X-Ray in AM once patient has had adequate IVF Rehydration -Will need Walk Screen Prior to D/C to evaluate for Home O2  2. SIRS 2/2 to Influenza A -Patient was Tachycardic, Tachypenic and Had Low Grade Fever on Admission; Had worsening Tachycardia, Tachypenia and Fever today  -No blood Cx were obtained by EDP; CXR Negative, Urinalysis Negative -Blood Cx's done and showed NGTD <24 Hours -Has Significant Myalgias -Supportive Care with Oseltamivir 75 mg BID x 5 days -IVF Rehydration with NS at 75 mL/hr -Acetaminophen and Tramadol for Myalgia pain; May need Robaxin; Received Ketorolac in the ED -Continue Mucinex DM 1 tab po BID -**Patient Involved in FLU-IVIG Clinical Trial today because she met inclusion criteria.  -Started Coughing up brown sputum today; Will send for Sputum Cx and repeat CXR in AM -Still had Mild fever today and WBC decreased to 3.8  3. Thyroid Nodule -CT Angio showed Stable 1.6 cm nodule in the right thyroid lobe.  -Ultrasound of the Thyroid  showed Bilateral nodules. The 2.4 cm right mid lobe nodule meets criteria for fine needle aspiration biopsy. -TSH was 0.340 and Free T4 was 1.03 -Will need outpatient Bx as will not do it this visit because of her clinical illness  4. Insulin Dependent Diabetes Mellitus Type 2 complicated by Diabetic Neuropathy -Hold Home Saxagliptin 5 mg po daily and  home Insulin NPH -Lantus 20 Units sq qHS increased to 36 Units sq qHS per Diabetic Coordinator Reccommendation and Continue Moderate Novolog SSI -Check HbA1c; HbA1c was 9.2 in 2010 -Continue to Monitor CBG's closely; Ranging from 162 - 305 -UA showed >500 Urine Glucose -C/w Home Gabapentin 400 mg po TID -Will consider giving copay Card for Lantus Solostar pen so she can get Lantus for $10 a month at D/C -Other option for D/C is [employer] Brand Reli-On NPH Insulin which is $25.00 a bottle  5. Depression/Anxiety  -C/w Home Citalopram 40 mg po Daily and Home Clonazepam 1 mg po BID  6. Lumbar Disc Disease -C/w Cyclobenzaprine and Tramadol for Pain from Bulging discs   7. Hyponatremia, improved -Improved to 135 -C/w IVF Rehydration and Repeat CMP in AM  8. Non-Sustained VTACH -Had 7 beat Run of VTach -Had Troponin <0.03 -Electrolytes WNL -Continue to Monitor Closely   DVT prophylaxis: Lovenox 60 mg sq Code Status: FULL CODE Family Communication: No Family present at bedside Disposition Plan: Remain Inpatient until Clinical Status is improving  Consultants: None   Procedures: None  Antimicrobials:  Anti-infectives    Start     Dose/Rate Route Frequency Ordered Stop   11/13/16 2200  oseltamivir (TAMIFLU) capsule 75 mg     75 mg Oral 2 times daily 11/13/16 1908 11/18/16 2159     Subjective: Patient was seen this AM and stated she was feeling the same. Developed a headache after IVIG infusion last night and still had it this Am. No N/V. Stated she was wheezing some and coughing up brown colored sputum.   Objective: Vitals:   11/15/16 0810 11/15/16 0816 11/15/16 1300 11/15/16 1707  BP:  140/67 116/71 107/69  Pulse:  79 70 78  Resp: (!) '22 20 18 16  ' Temp:  99.5 F (37.5 C) 99.1 F (37.3 C) 98.7 F (37.1 C)  TempSrc:  Oral Oral Oral  SpO2: (!) 85% 93% 100% 98%  Weight:      Height:        Intake/Output Summary (Last 24 hours) at 11/15/16 1732 Last data filed at  11/15/16 1608  Gross per 24 hour  Intake          1841.25 ml  Output                0 ml  Net          1841.25 ml   Filed Weights   11/13/16 1106 11/13/16 1910 11/14/16 0618  Weight: 116.6 kg (257 lb) 119.7 kg (263 lb 14.3 oz) 120 kg (264 lb 8.8 oz)   Examination: Physical Exam:  Constitutional: WN/WD,obese AAF who is ill appearing and worse than yesterday Eyes: Lids and conjunctivae normal, sclerae anicteric  ENMT: External Ears, Nose appear normal. Grossly normal hearing. Mucous membranes slightly dry. Neck: Appears normal, supple,   Respiratory: Upper Airway wheezing appreciated. Diminished lungs but clear to auscultation bilaterally, no wheezing, rales, rhonchi or crackles. Slightly increased respiratory effort but No accessory muscle use. Wearing supplemental O2 via [place]. Cardiovascular: RRR, no murmurs / rubs / gallops. S1 and S2 auscultated. No extremity edema. 2+ pedal  pulses.  Abdomen: Soft, non-tender, non-distended. No masses palpated. No appreciable hepatosplenomegaly. Bowel sounds positive x4.  GU: Deferred. Musculoskeletal: No clubbing / cyanosis of digits/nails. No joint deformity upper and lower extremities. No Contractures Skin: No rashes, lesions, ulcers on limited skin evaluation. No induration; Warm and dry.  Neurologic: CN 2-12 grossly intact with no focal deficits.  Romberg sign cerebellar reflexes not assessed.  Psychiatric: Normal judgment and insight. Alert and oriented x 3. Normal mood and appropriate affect.   Labs on Admission: I have personally reviewed following labs and imaging studies  Data Reviewed: I have personally reviewed following labs and imaging studies  CBC:  Recent Labs Lab 11/13/16 1128 11/14/16 0500 11/14/16 1623 11/15/16 0608  WBC 8.1 6.4 5.0 3.8*  NEUTROABS  --   --  3.0 2.0  HGB 14.1 12.2 11.7* 11.8*  HCT 41.5 37.0 35.8* 36.2  MCV 87.2 87.1 88.8 89.6  PLT 196 161 152 161   Basic Metabolic Panel:  Recent Labs Lab  11/13/16 1128 11/14/16 0500 11/14/16 1623 11/15/16 0608  NA 133* 131* 135 135  K 4.3 4.2 3.9 4.5  CL 98* 99* 101 100*  CO2 '25 25 30 30  ' GLUCOSE 289* 279* 175* 182*  BUN '6 8 8 8  ' CREATININE 0.84 0.84 0.72 0.71  CALCIUM 8.7* 7.9* 7.8* 7.9*  MG  --   --  1.8 1.9  PHOS  --   --  2.6 3.1   GFR: Estimated Creatinine Clearance: 101.9 mL/min (by C-G formula based on SCr of 0.71 mg/dL). Liver Function Tests:  Recent Labs Lab 11/14/16 0500 11/14/16 1623 11/15/16 0608  AST '27 31 27  ' ALT '26 26 25  ' ALKPHOS 49 55 53  BILITOT 0.7 0.5 0.4  PROT 6.3* 6.3* 6.4*  ALBUMIN 3.3* 3.2* 3.0*   No results for input(s): LIPASE, AMYLASE in the last 168 hours. No results for input(s): AMMONIA in the last 168 hours. Coagulation Profile:  Recent Labs Lab 11/14/16 1623  INR 1.10   Cardiac Enzymes:  Recent Labs Lab 11/14/16 1623 11/15/16 0608 11/15/16 1244  TROPONINI <0.03 <0.03 <0.03   BNP (last 3 results) No results for input(s): PROBNP in the last 8760 hours. HbA1C:  Recent Labs  11/14/16 0500  HGBA1C 10.1*   CBG:  Recent Labs Lab 11/14/16 1625 11/14/16 2121 11/14/16 2259 11/15/16 0734 11/15/16 1144  GLUCAP 175* 195* 191* 178* 223*   Lipid Profile: No results for input(s): CHOL, HDL, LDLCALC, TRIG, CHOLHDL, LDLDIRECT in the last 72 hours. Thyroid Function Tests:  Recent Labs  11/14/16 0500  TSH 0.340*  FREET4 1.03   Anemia Panel: No results for input(s): VITAMINB12, FOLATE, FERRITIN, TIBC, IRON, RETICCTPCT in the last 72 hours. Sepsis Labs:  Recent Labs Lab 11/13/16 1617  LATICACIDVEN 1.71   Recent Results (from the past 240 hour(s))  Culture, blood (Routine X 2) w Reflex to ID Panel     Status: None (Preliminary result)   Collection Time: 11/14/16  2:53 PM  Result Value Ref Range Status   Specimen Description BLOOD LEFT HAND  Final   Special Requests BOTTLES DRAWN AEROBIC AND ANAEROBIC 5CC  Final   Culture   Final    NO GROWTH < 24 HOURS Performed  at [facility], [place] [address], [place], [place] [address]    Report Status PENDING  Incomplete  Culture, blood (Routine X 2) w Reflex to ID Panel     Status: None (Preliminary result)   Collection Time: 11/14/16  2:53  PM  Result Value Ref Range Status   Specimen Description BLOOD RIGHT ARM  Final   Special Requests BOTTLES DRAWN AEROBIC AND ANAEROBIC 5CC  Final   Culture   Final    NO GROWTH < 24 HOURS Performed at [facility], [place] [address] [place], [place] [address]    Report Status PENDING  Incomplete     Radiology Studies: US Thyroid  Result Date: 11/14/2016 CLINICAL DATA:  Incidental on other study.  CT chest earlier today. EXAM: THYROID ULTRASOUND TECHNIQUE: Ultrasound examination of the thyroid gland and adjacent soft tissues was performed. COMPARISON:  None. FINDINGS: Parenchymal Echotexture: Mildly heterogenous Isthmus: 0.5 cm Right lobe: 4.9 x 2.0 x 2.0 cm. Left lobe: 4.3 x 1.5 x 1.6 cm. _________________________________________________________ Estimated total number of nodules >/= 1 cm: 1 Number of spongiform nodules >/=  2 cm not described below (TR1): 0 Number of mixed cystic and solid nodules >/= 1.5 cm not described below (TR2): Nodule # 1: Location: Right; Mid Maximum size: 2.4 cm; Other 2 dimensions: 1.7 x 1.4 cm Composition: mixed cystic and solid (1) Echogenicity: isoechoic (1) Shape: taller-than-wide (3) Margins: smooth (0) Echogenic foci: none (0) ACR TI-RADS total points: 5. ACR TI-RADS risk category: TR4 (4-6 points). ACR TI-RADS recommendations: **Given size (>/= 1.5 cm) and appearance, fine needle aspiration of this moderately suspicious nodule should be considered based on TI-RADS criteria. 0.8 cm left mid lobe hypoechoic nodule has a benign appearance. IMPRESSION: Bilateral nodules. The 2.4 cm right mid lobe nodule meets criteria for fine needle aspiration biopsy. The above is in keeping with the ACR TI-RADS recommendations - J Am Coll Radiol  2017;14:587-595. Electronically Signed   By: Marybelle Killings M.D.   On: 11/14/2016 07:43   Scheduled Meds: . citalopram  40 mg Oral Daily  . dextromethorphan-guaiFENesin  1 tablet Oral BID  . enoxaparin (LOVENOX) injection  0.5 mg/kg Subcutaneous Q24H  . fluticasone furoate-vilanterol  1 puff Inhalation Daily  . gabapentin  400 mg Oral TID  . insulin aspart  0-15 Units Subcutaneous TID WC  . insulin glargine  36 Units Subcutaneous QHS  . ipratropium-albuterol  3 mL Nebulization Q6H  . oseltamivir  75 mg Oral BID  . sodium chloride flush  3 mL Intravenous Q12H   Continuous Infusions: . sodium chloride 75 mL/hr at 11/15/16 0700    LOS: 1 day   Kerney Elbe, DO [facility] Pager [phone]  If 7PM-7AM, please contact night-coverage [email or web address] Password [facility] 11/15/2016, 5:32 PM

## 2016-11-15 NOTE — Progress Notes (Signed)
Title: A Randomized, Double-Blind, Placebo-Controlled Dose Ranging Study Evaluating the Safety Pharmacokinetics and Clinical Benefit of FLU-IGIV in Hospitalized Patients with Serious Influenza A infection. IA-001 (ClinicalTrials.gov Identifier: [id number], Protocol No: IA-001, [facility] Protocol #[id number])  RESEARCH SUBJECT. This research study is sponsored by [employer]   The investigational product is called NP-025 aka FLU-IGIV or anti-influenza immune globulin intravenous. It is produced from source plasma collected from [place] ([place] Education officer, environmental (FDA) licensed plasma collection establishments from healthy donors who have recovered from influenza (convalescent) and/or were vaccinated against seasonal influenza strains. The plasma contains a relatively high concentration of polyclonal antibodies directed against seasonal influenza strains, specifically influenza A strains H1N1 ([place], [place]) and H3N2 ([place]). It is a glycoprotein of 150-160 kilodaltons against Hemagluttinin (HA) and Neuraminidase (NA) surface proteins.   Key Inclusion Criteria: Adult patients; locally determined positive influenza A infection (Rapid Antigen Test or PCR) from a specimen obtained within 2 days prior to randomization; onset of symptoms </= 6 days prior to randomization; experiencing >/= 1 respiratory symptom (cough, sore throat, nasal congestion) and >/= 1 constitutional symptom (headache, myalgia, feverishness or fatigue); NEW score >/=3 at screening; must have an active order for a minimum 5 day course of oseltamivir (75mg /twice daily).  Key Exclusion Criteria: History of hypersensitivity to blood or plasma products; history of allergy to latex or rubber; pregnancy or lactation; known IgA deficiency; medical conditions for which receipt of a 500 mL volume of IV fluid may be dangerous; a pre-existing condition or use of medication that may place the individual at a  substantially increased risk of thrombosis; anticipated life expectancy < 90 days; confirmed bacterial pneumonia or any concurrent respiratory viral infection that is not influenza A.  Key other data: Side effects with infusion  Incidence Occurrence Side effect  Common 1-15%, typically < 5% First 30 minutes of infusion Back pain, abdominal pain, nausea, vomiting  Common 1-15%, typically < 5% First few minutes to several hours after infusion Chills, fever, headache, myalgia, fatigue  Rare Seconds to several hours after infusion Hypersensitivity reaction (happens with IgA deficiency), flushing, facial swelling, dyspnea, cyanosis, shock, cardiac arrest, pneumonitis, renal failure, aseptic meningitis, hemolysis, viral infection  Rare Seconds to several hours after infusion Thrombosis (at risk patients are those with a history of DVT, arterial thrombosis, multiple cardiovascular risk factors, impaired cardiac output, advanced age, coagulation disorders, prolonged immobilization, use of estrogen, indwelling catheters, known/suspected hyperviscosity)  Recommendation: For patients who are at risk of developing thrombotic events, administer NP-025 at the minimum rate of infusion practicable, not to exceed 2 mL/min. Ensure adequate hydration in patients before administration. Monitor  Rare 1-6 hours after infusion Transfusion related Acute Lung Injury (TRALI)  Rare  Hemolysis (dose related, happens in total > 2g/kg and non-O blood group), severe hemolysis may lead to renal dysfunction/failure.  Recommendation: Check hemoglobin pre- and post 36-96h and 7-10 days transfusion  Rare Several hours to two  days after infusion Aseptic Meningitis (AMS) (dose related, happens in total > 2g/kg)   NOTE: NP-025 contains 10% maltose - will interfere with glucose measurements if non-glucose specific measurement devices are not used. Results in false high glucose levels can  result in increased insulin -> life threatening  hypoglycemia  ...................................................................................................  Clinical Research Coordinator / Research RN note : This visit for Subject Veronica [name] with [id number] on 11/15/2016 for the above protocol is Visit/Encounter Day 2 and is for purpose of Day 2 follow up assessments. The consent for this  encounter is under Protocol Version 2.0 and is currently IRB approved. [place]ove[place]ion Dao expressed continued interest and consent in continuing as a study subject.  In this visit 11/15/2016 the subject will be evaluated by investigator named Kalman ShanMurali Ramaswamy, MD. This research coordinator has verified that the investigator is up to date with his training logs  Day 2 follow up assessments completed per study protocol by this RN. Study subject reports ongoing headache that has worsened from baseline since yesterday evening. Patient reports prior to infusion ~17:00 headache was around a 5 on a 0-10 pain scale. Headache progressively worsened throughout the night and did not subside until earlier this afternoon after receiving PRN pain medications. Kalman ShanMurali Ramaswamy, MD at bedside to assess patient. See MD notes. Subject thanked for participation in research. Will continue to follow up tomorrow afternoon for Day 3 assessments.    Signed by  Toula MoosFrances J Ewart Carrera, RN Clinical Research Nurse  [facility], [facility] Office: [phone] 5:39 PM 11/15/2016

## 2016-11-16 ENCOUNTER — Inpatient Hospital Stay (HOSPITAL_COMMUNITY): Payer: BLUE CROSS/BLUE SHIELD

## 2016-11-16 DIAGNOSIS — G4440 Drug-induced headache, not elsewhere classified, not intractable: Secondary | ICD-10-CM

## 2016-11-16 LAB — COMPREHENSIVE METABOLIC PANEL
ALBUMIN: 3 g/dL — AB (ref 3.5–5.0)
ALT: 21 U/L (ref 14–54)
ANION GAP: 7 (ref 5–15)
AST: 24 U/L (ref 15–41)
Alkaline Phosphatase: 48 U/L (ref 38–126)
BILIRUBIN TOTAL: 0.4 mg/dL (ref 0.3–1.2)
BUN: 5 mg/dL — AB (ref 6–20)
CHLORIDE: 98 mmol/L — AB (ref 101–111)
CO2: 26 mmol/L (ref 22–32)
Calcium: 7.7 mg/dL — ABNORMAL LOW (ref 8.9–10.3)
Creatinine, Ser: 0.63 mg/dL (ref 0.44–1.00)
GFR calc Af Amer: >60 mL/min (ref >60)
GFR calc non Af Amer: >60 mL/min (ref >60)
GLUCOSE: 128 mg/dL — AB (ref 65–99)
POTASSIUM: 4.2 mmol/L (ref 3.5–5.1)
SODIUM: 131 mmol/L — AB (ref 135–145)
TOTAL PROTEIN: 5.8 g/dL — AB (ref 6.5–8.1)

## 2016-11-16 LAB — CBC WITH DIFFERENTIAL/PLATELET
BASOS ABS: 0 10*3/uL (ref 0.0–0.1)
BASOS PCT: 0 %
EOS ABS: 0 10*3/uL (ref 0.0–0.7)
Eosinophils Relative: 0 %
HEMATOCRIT: 34.2 % — AB (ref 36.0–46.0)
Hemoglobin: 11.2 g/dL — ABNORMAL LOW (ref 12.0–15.0)
Lymphocytes Relative: 26 %
Lymphs Abs: 1.2 10*3/uL (ref 0.7–4.0)
MCH: 29.3 pg (ref 26.0–34.0)
MCHC: 32.7 g/dL (ref 30.0–36.0)
MCV: 89.5 fL (ref 78.0–100.0)
MONO ABS: 0.5 10*3/uL (ref 0.1–1.0)
Monocytes Relative: 11 %
NEUTROS ABS: 2.8 10*3/uL (ref 1.7–7.7)
Neutrophils Relative %: 63 %
PLATELETS: 171 10*3/uL (ref 150–400)
RBC: 3.82 MIL/uL — ABNORMAL LOW (ref 3.87–5.11)
RDW: 12.7 % (ref 11.5–15.5)
WBC: 4.5 10*3/uL (ref 4.0–10.5)

## 2016-11-16 LAB — MAGNESIUM: MAGNESIUM: 1.9 mg/dL (ref 1.7–2.4)

## 2016-11-16 LAB — GLUCOSE, CAPILLARY
GLUCOSE-CAPILLARY: 134 mg/dL — AB (ref 65–99)
GLUCOSE-CAPILLARY: 162 mg/dL — AB (ref 65–99)
Glucose-Capillary: 136 mg/dL — ABNORMAL HIGH (ref 65–99)
Glucose-Capillary: 202 mg/dL — ABNORMAL HIGH (ref 65–99)

## 2016-11-16 LAB — HEMOGLOBIN A1C
Hgb A1c MFr Bld: 10.3 % — ABNORMAL HIGH (ref 4.8–5.6)
Mean Plasma Glucose: 249 mg/dL

## 2016-11-16 LAB — PHOSPHORUS: PHOSPHORUS: 3.2 mg/dL (ref 2.5–4.6)

## 2016-11-16 LAB — CALCIUM, IONIZED: CALCIUM, IONIZED, SERUM: 4.4 mg/dL — AB (ref 4.5–5.6)

## 2016-11-16 MED ORDER — GADOBENATE DIMEGLUMINE 529 MG/ML IV SOLN
20.0000 mL | Freq: Once | INTRAVENOUS | Status: AC | PRN
  Administered 2016-11-16: 20 mL via INTRAVENOUS

## 2016-11-16 MED ORDER — SUMATRIPTAN SUCCINATE 25 MG PO TABS
25.0000 mg | ORAL_TABLET | Freq: Once | ORAL | Status: DC
  Filled 2016-11-16: qty 1

## 2016-11-16 MED ORDER — FUROSEMIDE 10 MG/ML IJ SOLN
20.0000 mg | Freq: Once | INTRAMUSCULAR | Status: AC
Start: 2016-11-16 — End: 2016-11-16
  Administered 2016-11-16: 20 mg via INTRAVENOUS
  Filled 2016-11-16: qty 2

## 2016-11-16 MED ORDER — LORAZEPAM 2 MG/ML IJ SOLN
1.0000 mg | Freq: Once | INTRAMUSCULAR | Status: AC
  Administered 2016-11-16: 1 mg via INTRAVENOUS
  Filled 2016-11-16: qty 1

## 2016-11-16 MED ORDER — KETOROLAC TROMETHAMINE 30 MG/ML IJ SOLN
30.0000 mg | Freq: Three times a day (TID) | INTRAMUSCULAR | Status: DC | PRN
  Administered 2016-11-16 – 2016-11-17 (×3): 30 mg via INTRAVENOUS
  Filled 2016-11-16 (×3): qty 1

## 2016-11-16 MED ORDER — KETOROLAC TROMETHAMINE 15 MG/ML IJ SOLN: 15.0000 mg | Freq: Once | INTRAMUSCULAR | Status: DC

## 2016-11-16 MED ORDER — KETOROLAC TROMETHAMINE 30 MG/ML IJ SOLN
30.0000 mg | Freq: Once | INTRAMUSCULAR | Status: AC
  Administered 2016-11-16: 30 mg via INTRAVENOUS
  Filled 2016-11-16: qty 1

## 2016-11-16 NOTE — Progress Notes (Signed)
Title: A Randomized, Double-Blind, Placebo-Controlled Dose Ranging Study Evaluating the Safety Pharmacokinetics and Clinical Benefit of FLU-IGIV in Hospitalized Patients with Serious Influenza A infection. IA-001 (ClinicalTrials.gov Identifier: [id number], Protocol No: IA-001, [facility] Protocol #[id number])  RESEARCH SUBJECT. This research study is sponsored by [employer]    ................................................................................................... PI note - saw patient approximately 30 min ago  S: had severe headache 10/10 earlier. HAd multiple conversations with hospitalist and also d/w medical monitor -  Patient told me again that headache started only after end of IvIG . This am got an NSAID for first time and headache improved after the toradol from 10 to 5. She specifically stated she did not dc her home medications prior to admit to account for withdrawal headache. Had MRI 11/16/2016 and this is normal  This morning she walked to bathroom and got dyspneic. This evening she walked 6 min per RN and did not desaturate below 92% on RA - s/p lasix per [facility] MD for possible volume overload   O Vitals:   11/16/16 1528 11/16/16 1529 11/16/16 1604 11/16/16 1638  BP:   117/67   Pulse:   76   Resp:   16   Temp:   99 F (37.2 C)   TempSrc:   Oral   SpO2: 90% 95% 93% 96%  Weight:      Height:       Pulse ox is room air at rest  EXAM Intact neurologically No diplopia No double vision No nystagmus No meningeal signs PEERL +  Clear lung fields Normal heart sounds Soft abd Intact skin No neck nodes  LABS PULMONARY No results for input(s): PHART, PCO2ART, PO2ART, HCO3, TCO2, O2SAT in the last 168 hours.  Invalid input(s): PCO2, PO2  CBC  Recent Labs Lab 11/15/16 0608 11/15/16 1655 11/16/16 0623  HGB 11.8* 11.2* 11.2*  HCT 36.2 34.5* 34.2*  WBC 3.8* 3.6* 4.5  PLT 163 164 171    COAGULATION  Recent Labs Lab 11/14/16 1623  11/15/16 1655  INR 1.10 0.96    CARDIAC   Recent Labs Lab 11/14/16 1623 11/15/16 0608 11/15/16 1244  TROPONINI <0.03 <0.03 <0.03   No results for input(s): PROBNP in the last 168 hours.   CHEMISTRY  Recent Labs Lab 11/14/16 0500 11/14/16 1623 11/15/16 0608 11/15/16 1655 11/16/16 0623  NA 131* 135 135 133* 131*  K 4.2 3.9 4.5 3.9 4.2  CL 99* 101 100* 100* 98*  CO2 25 30 30 28 26   GLUCOSE 279* 175* 182* 151* 128*  BUN 8 8 8 7  5*  CREATININE 0.84 0.72 0.71 0.65 0.63  CALCIUM 7.9* 7.8* 7.9* 7.7* 7.7*  MG  --  1.8 1.9  --  1.9  PHOS  --  2.6 3.1  --  3.2   Estimated Creatinine Clearance: 104 mL/min (by C-G formula based on SCr of 0.63 mg/dL).   LIVER  Recent Labs Lab 11/14/16 0500 11/14/16 1623 11/15/16 0608 11/15/16 1655 11/16/16 0623  AST 27 31 27 26 24   ALT 26 26 25 22 21   ALKPHOS 49 55 53 49 48  BILITOT 0.7 0.5 0.4 0.2* 0.4  PROT 6.3* 6.3* 6.4* 5.9* 5.8*  ALBUMIN 3.3* 3.2* 3.0* 2.9* 3.0*  INR  --  1.10  --  0.96  --      INFECTIOUS  Recent Labs Lab 11/13/16 1617  LATICACIDVEN 1.71     ENDOCRINE CBG (last 3)   Recent Labs  11/16/16 0728 11/16/16 1308 11/16/16 1602  GLUCAP 136* 134*  202*         IMAGING x48h  - image(s) personally visualized  -   highlighted in bold Dg Chest 2 View  Result Date: 11/16/2016 CLINICAL DATA:  Productive cough. EXAM: CHEST  2 VIEW COMPARISON:  November 13, 2016 chest x-ray and CT scan FINDINGS: No pneumothorax. The heart size is borderline. The hila and mediastinum are unchanged. Increased interstitial markings in the lungs in the interval. IMPRESSION: Increased interstitial markings in the lungs may represent edema or atypical infection. No focal infiltrate. Electronically Signed   By: Gerome Sam III M.D   On: 11/16/2016 14:40   Mr Brain W Wo Contrast  Result Date: 11/16/2016 CLINICAL DATA:  Inflow un is a infection. Severe headache beginning today. Treated with IVIG. Assess for meningitis EXAM:  MRI HEAD WITHOUT AND WITH CONTRAST TECHNIQUE: Multiplanar, multiecho pulse sequences of the brain and surrounding structures were obtained without and with intravenous contrast. CONTRAST:  20mL MULTIHANCE GADOBENATE DIMEGLUMINE 529 MG/ML IV SOLN COMPARISON:  None. FINDINGS: Brain: The brain has normal appearance without evidence of malformation, atrophy, old or acute small or large vessel infarction, hemorrhage, hydrocephalus or extra-axial collection. No pituitary abnormality. After contrast administration, no abnormal enhancement occurs. Vascular: Major vessels at the base of the brain show flow. Skull and upper cervical spine: Normal Sinuses/Orbits: Clear/ normal. Other: None significant. IMPRESSION: Normal examination. No sign of meningitis or other intracranial pathology by MRI. Electronically Signed   By: Paulina Fusi M.D.   On: 11/16/2016 12:10    A/P) Headache - severe, AE related to study drug. Improved after NSAID prn. Normal MRI. No meningeal signs. No other plausible cause. PLAN: NSaid prn per [facility] - d/w hospitalist  Dyspnea and worsened cxr- increased interstital marking -> VOLUME OVERLOAD  this could be volume overload related +4.7L since admit -> agree with lasix per hospitalist. This is an AE. Is mild. Not related to study drug  Research - s/p Iv IG for flu A  Flu A -> on tamiflu per hospitalist  Others - anemia, hyponatermia,  - all mild and ucnhanged and not AE and unrelated to study drug   Dr. Kalman Shan, M.D., [facility].C.P Pulmonary and Critical Care Medicine Staff Physician [facility] Pager: [phone], If no answer or between  15:00h - 7:00h: call [phone]  11/16/2016 6:03 PM

## 2016-11-16 NOTE — Progress Notes (Signed)
PROGRESS NOTE   Veronica [name]  VXB:[id number] DOB: [birth year] DOA: 11/13/2016 PCP: Charolette Forward, PA-C  Brief Narrative:  Veronica [name] is a 56 y.o. 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 Cough has progressively gotten worse and is productive and cloudy/clear. Cough has increased since Tuesday and patient was going to come to ED yesterday but could not because of [place]. Has not had an appetitie but has been trying to rehydrate with po fluids. Patient states she has had headaches as well. Been around sick contact and was around them on Tuesday. She feels like she has been wheezing and has developed pain in chest from coughing. She has felt nauseous but has not vomited and this AM she developed a headache. States myalgias got so bad that she came in for evaluation. She presented to the ED and was found to be hypoxic on room air. TRH was asked to see patient and admit for Hypoxia and it was found that she was positive for Influenza A. Patient acutely worsened 11/14/16 and was enrolled in a Clinical Trial using Flu-IVIG. Was able to wean from O2 and on Room Air saturating well however states she gets dyspneic while ambulating. This AM she woke up with a severe headache and states she never really gets headaches. When discussed she states the severe headache developed after infusion of the Flu-IVIG.  Assessment & Plan:   Principal Problem:   Influenza A Active Problems:   Diabetes mellitus ([place])   Hypoxia   Respiratory failure with hypoxia (HCC)   SIRS (systemic inflammatory response syndrome) (HCC)  Anxiety and depression   Thyroid nodule   Hyponatremia   Research subject  1. Acute Hypoxic Respiratory Failure likely 2/2 to Influenza A, improved -Changed to Inpatient as she acutely worsened  -Continuous Pulse Oximetry; Able to maintain Saturations this AM on Room Air -Supplemental O2 via [place]; Maintain O2 Saturations > 92% -DuoNeb Breathing Treatments TID -C/w Home BREO Ellipta -Initial CXR showed The heart size and mediastinal contours are within normal limits. Both lungs are clear. The visualized skeletal structures are unremarkable. -Respiratory Panel by PCR showed Influenza A H3 Positive but rest of panel Negative -PE R/o'd Out as Chest CT Angio showed Evaluation for pulmonary emboli is limited due to artifact and respiratory motion but no convincing pulmonary emboli are identified. -Repeat 2 View Chest X-Ray this AM Pending -Will need Walk Screen Prior to D/C to evaluate for Home O2 -Encouraged Ambulation  2. SIRS 2/2 to Influenza A, improving -Patient was Tachycardic, Tachypenic and Had Low Grade Fever on Admission;  -Today patient had Temperature of 99.5, Pulse of 81, and RR of 17 and Saturating 100% on Room Air -No blood Cx were obtained by EDP; CXR Negative, Urinalysis Negative -Blood Cx's done and showed NGTD <24 Hours -Had Significant Myalgias but improved tremendously -Supportive Care with Oseltamivir 75 mg BID x 5 days -Decreased IVF Rehydration with NS at 75 mL/hr to 50 mL/hr -Acetaminophen and Tramadol for Myalgia pain; May need Robaxin; Received Ketorolac in the ED -Continue Mucinex DM 1 tab po BID -**Patient Involved in FLU-IVIG Clinical Trial today because she met  inclusion criteria.  -Started Coughing up brown sputum yesterday ; Will send for Sputum Cx and repeat CXR this Am  3. Severe Headache -No focal deficits except that she has had paraesthesias in hands from her Diabetes -Stated it was Severe 9/10 and happened after infusion of Flu-IVIG Research drug and  states she does not previously get headaches -Because of Concern of Side Effects and Risk of Aspectic Meninigits, Dr. Chase Caller notified -MRI of Brain with and without Contrast ordered to Evaluate after discussion with Dr. Tomasita Crumble the NeuroRadiologist -Continue with Tylenol/Tramadol;  -Will repeat Ketorolac NSAID 30 mg IV once she recieved in the Ed; May even try Sumatriptan -Continue to Monitor closely and follow up on MRI of Brain  4. Thyroid Nodule -CT Angio showed Stable 1.6 cm nodule in the right thyroid lobe.  -Ultrasound of the Thyroid showed Bilateral nodules. The 2.4 cm right mid lobe nodule meets criteria for fine needle aspiration biopsy. -TSH was 0.340 and Free T4 was 1.03 -Will need outpatient Bx as will not do it this visit because of her clinical illness  5. Insulin Dependent Diabetes Mellitus Type 2 complicated by Diabetic Neuropathy -Hold Home Saxagliptin 5 mg po daily and home Insulin NPH -Lantus 20 Units sq qHS increased to 36 Units sq qHS per Diabetic Coordinator Reccommendation and Continue Moderate Novolog SSI -HbA1c was 10.1; HbA1c was 9.2 in 2010 -Continue to Monitor CBG's closely; Ranging from 136-173 -UA showed >500 Urine Glucose -C/w Home Gabapentin 400 mg po TID -Will consider giving copay Card for Lantus Solostar pen so she can get Lantus for $10 a month at D/C -Other option for D/C is [employer] Brand Reli-On NPH Insulin which is $25.00 a bottle  6. Depression/Anxiety  -C/w Home Citalopram 40 mg po Daily and Home Clonazepam 1 mg po BID  7. Lumbar Disc Disease -C/w Cyclobenzaprine and Tramadol for Pain from Bulging discs   8. Hyponatremia mild -Improved to 131 -C/w IVF Rehydration at 50 mL/hr and Repeat CMP in AM  9. Non-Sustained VTACH -Had 7 beat Run of VTach -Had Troponin <0.03 x 3 -Electrolytes WNL -Continue to Monitor Closely   DVT prophylaxis: Lovenox 60 mg sq Code Status: FULL CODE Family Communication: No Family present at  bedside Disposition Plan: Remain Inpatient until Clinical Status is improving  Consultants:  -PCCM Murali Ramaswamy involved in Research with Flu-IVIG -Discussed with Dr. Tomasita Crumble, NeuroRadiologist about Imaging   Procedures: MRI of brain with and without contrast  Antimicrobials:  Anti-infectives    Start     Dose/Rate Route Frequency Ordered Stop   11/13/16 2200  oseltamivir (TAMIFLU) capsule 75 mg     75 mg Oral 2 times daily 11/13/16 1908 11/18/16 2159     Subjective: Patient was seen this AM and states she woke up with a severe headache and usually does not get headaches. Per Nurse patient had stated she had felt like she was having an aura but when discussing with the patient she denies it. States the headache developed after the research drug was given. No nausea or vomiting and maintaining saturations off of [place]. No Cp. States she gets SOB still when she ambulates. No other complaints except the Headache.  Objective: Vitals:   11/15/16 2054 11/16/16 0227 11/16/16 0409 11/16/16 0634  BP: (!) 141/79  (!) 106/58   Pulse: 72  81   Resp: 18  17   Temp: 99.7 F (37.6 C)  99.5 F (37.5 C)   TempSrc: Oral  Oral   SpO2: 96% 98%  93% 90%  Weight:   124.2 kg (273 lb 13 oz)   Height:        Intake/Output Summary (Last 24 hours) at 11/16/16 0905 Last data filed at 11/16/16 0300  Gross per 24 hour  Intake          1751.25 ml  Output                0 ml  Net          1751.25 ml   Filed Weights   11/13/16 1910 11/14/16 0618 11/16/16 0409  Weight: 119.7 kg (263 lb 14.3 oz) 120 kg (264 lb 8.8 oz) 124.2 kg (273 lb 13 oz)   Examination: Physical Exam:  Constitutional: WN/WD,obese AAF who is improved but only complaining of headache Eyes: Lids and conjunctivae normal, sclerae anicteric  ENMT: External Ears, Nose appear normal. Grossly normal hearing. Mucous membranes slightly dry. Neck: Appears normal, supple,   Respiratory: Upper Airway wheezing appreciated. Diminished lungs but  clear to auscultation bilaterally, no wheezing, rales, rhonchi or crackles. Normal respiratory effort and no accessory muscle use. Not wearing supplemental O2 via [place] and saturating 100%. Cardiovascular: RRR, no murmurs / rubs / gallops. S1 and S2 auscultated. No extremity edema. 2+ pedal pulses.  Abdomen: Soft, non-tender, non-distended. No masses palpated. No appreciable hepatosplenomegaly. Bowel sounds positive x4.  GU: Deferred. Musculoskeletal: No clubbing / cyanosis of digits/nails. No joint deformity upper and lower extremities. No Contractures Skin: No rashes, lesions, ulcers on limited skin evaluation. No induration; Warm and dry.  Neurologic: CN 2-12 grossly intact with no focal deficits appreciated. Sensation intact in all 4 extremities but patient having paraesthesias in hands. Romberg sign cerebellar reflexes not assessed.  Psychiatric: Normal judgment and insight. Alert and oriented x 3. Normal and pleasant mood and appropriate affect.   Data Reviewed: I have personally reviewed following labs and imaging studies  CBC:  Recent Labs Lab 11/14/16 0500 11/14/16 1623 11/15/16 0608 11/15/16 1655 11/16/16 0623  WBC 6.4 5.0 3.8* 3.6* 4.5  NEUTROABS  --  3.0 2.0 1.7 2.8  HGB 12.2 11.7* 11.8* 11.2* 11.2*  HCT 37.0 35.8* 36.2 34.5* 34.2*  MCV 87.1 88.8 89.6 89.4 89.5  PLT 161 152 163 164 450   Basic Metabolic Panel:  Recent Labs Lab 11/14/16 0500 11/14/16 1623 11/15/16 0608 11/15/16 1655 11/16/16 0623  NA 131* 135 135 133* 131*  K 4.2 3.9 4.5 3.9 4.2  CL 99* 101 100* 100* 98*  CO2 _0 GLUCOSE 279* 175* 182* 151* 128*  BUN _1 5*  CREATININE 0.84 0.72 0.71 0.65 0.63  CALCIUM 7.9* 7.8* 7.9* 7.7* 7.7*  MG  --  1.8 1.9  --  1.9  PHOS  --  2.6 3.1  --  3.2   GFR: Estimated Creatinine Clearance: 104 mL/min (by C-G formula based on SCr of 0.63 mg/dL). Liver Function Tests:  Recent Labs Lab 11/14/16 0500 11/14/16 1623 11/15/16 0608 11/15/16 1655  11/16/16 0623  AST _2 ALT _3 ALKPHOS 49 55 53 49 48  BILITOT 0.7 0.5 0.4 0.2* 0.4  PROT 6.3* 6.3* 6.4* 5.9* 5.8*  ALBUMIN 3.3* 3.2* 3.0* 2.9* 3.0*   No results for input(s): LIPASE, AMYLASE in the last 168 hours. No results for input(s): AMMONIA in the last 168 hours. Coagulation Profile:  Recent Labs Lab 11/14/16 1623 11/15/16 1655  INR 1.10 0.96   Cardiac Enzymes:  Recent Labs Lab 11/14/16 1623 11/15/16 0608 11/15/16 1244  TROPONINI <0.03 <0.03 <0.03   BNP (last 3 results) No results for input(s): PROBNP in the last 8760 hours. HbA1C:  Recent Labs  11/14/16 0500  HGBA1C 10.1*   CBG:  Recent Labs Lab 11/15/16 0734 11/15/16 1144 11/15/16 1741 11/15/16 2142 11/16/16 0728  GLUCAP 178* 223* 158* 173* 136*   Lipid Profile: No results for input(s): CHOL, HDL, LDLCALC, TRIG, CHOLHDL, LDLDIRECT in the last 72 hours. Thyroid Function Tests:  Recent Labs  11/14/16 0500  TSH 0.340*  FREET4 1.03   Anemia Panel: No results for input(s): VITAMINB12, FOLATE, FERRITIN, TIBC, IRON, RETICCTPCT in the last 72 hours. Sepsis Labs:  Recent Labs Lab 11/13/16 1617  LATICACIDVEN 1.71   Recent Results (from the past 240 hour(s))  Culture, blood (Routine X 2) w Reflex to ID Panel     Status: None (Preliminary result)   Collection Time: 11/14/16  2:53 PM  Result Value Ref Range Status   Specimen Description BLOOD LEFT HAND  Final   Special Requests BOTTLES DRAWN AEROBIC AND ANAEROBIC 5CC  Final   Culture   Final    NO GROWTH < 24 HOURS Performed at [facility], [place] [address] [place], [place] [address]    Report Status PENDING  Incomplete  Culture, blood (Routine X 2) w Reflex to ID Panel     Status: None (Preliminary result)   Collection Time: 11/14/16  2:53 PM  Result Value Ref Range Status   Specimen Description BLOOD RIGHT ARM  Final   Special Requests BOTTLES DRAWN AEROBIC AND ANAEROBIC 5CC  Final   Culture   Final    NO  GROWTH < 24 HOURS Performed at [facility], [place] [address], [place], [place] [address]    Report Status PENDING  Incomplete  Respiratory Panel by PCR     Status: Abnormal   Collection Time: 11/15/16 12:03 PM  Result Value Ref Range Status   Adenovirus NOT DETECTED NOT DETECTED Final   Coronavirus 229E NOT DETECTED NOT DETECTED Final   Coronavirus HKU1 NOT DETECTED NOT DETECTED Final   Coronavirus NL63 NOT DETECTED NOT DETECTED Final   Coronavirus OC43 NOT DETECTED NOT DETECTED Final   Metapneumovirus NOT DETECTED NOT DETECTED Final   Rhinovirus / Enterovirus NOT DETECTED NOT DETECTED Final   Influenza A H3 DETECTED (A) NOT DETECTED Final   Influenza B NOT DETECTED NOT DETECTED Final   Parainfluenza Virus 1 NOT DETECTED NOT DETECTED Final   Parainfluenza Virus 2 NOT DETECTED NOT DETECTED Final   Parainfluenza Virus 3 NOT DETECTED NOT DETECTED Final   Parainfluenza Virus 4 NOT DETECTED NOT DETECTED Final   Respiratory Syncytial Virus NOT DETECTED NOT DETECTED Final   Bordetella pertussis NOT DETECTED NOT DETECTED Final   Chlamydophila pneumoniae NOT DETECTED NOT DETECTED Final   Mycoplasma pneumoniae NOT DETECTED NOT DETECTED Final    Comment: Performed at [facility], [place] [address], [place], [place] [address]    Radiology Studies: No results found. Scheduled Meds: . citalopram  40 mg Oral Daily  . dextromethorphan-guaiFENesin  1 tablet Oral BID  . enoxaparin (LOVENOX) injection  0.5 mg/kg Subcutaneous Q24H  . fluticasone furoate-vilanterol  1 puff Inhalation Daily  . gabapentin  400 mg Oral TID  . insulin aspart  0-15 Units Subcutaneous TID WC  . insulin glargine  36 Units Subcutaneous QHS  . ipratropium-albuterol  3 mL Nebulization Q6H  . oseltamivir  75 mg  Oral BID  . sodium chloride flush  3 mL Intravenous Q12H  . SUMAtriptan  25 mg Oral Once   Continuous Infusions: . sodium chloride 75 mL/hr at 11/16/16 0733    LOS: 2 days   Kerney Elbe,  DO [facility] Pager [phone]  If 7PM-7AM, please contact night-coverage [email or web address] Password [email or web address] 11/16/2016, 9:05 AM

## 2016-11-16 NOTE — Progress Notes (Signed)
Pt ambulated in hallway for minimum of 6 min, O2 sats remained 92% or greater, HR was elevated up to 130. At rest HR remains in 70 range and O2 sats 95% or greater.  Justin Mendaudle, Hamzah Savoca H, RN

## 2016-11-17 DIAGNOSIS — Z006 Encounter for examination for normal comparison and control in clinical research program: Secondary | ICD-10-CM

## 2016-11-17 LAB — CBC WITH DIFFERENTIAL/PLATELET
Basophils Absolute: 0 10*3/uL (ref 0.0–0.1)
Basophils Relative: 0 %
EOS PCT: 1 %
Eosinophils Absolute: 0 10*3/uL (ref 0.0–0.7)
HEMATOCRIT: 34.4 % — AB (ref 36.0–46.0)
Hemoglobin: 11.3 g/dL — ABNORMAL LOW (ref 12.0–15.0)
Lymphocytes Relative: 44 %
Lymphs Abs: 1.4 10*3/uL (ref 0.7–4.0)
MCH: 28.5 pg (ref 26.0–34.0)
MCHC: 32.8 g/dL (ref 30.0–36.0)
MCV: 86.9 fL (ref 78.0–100.0)
MONO ABS: 0.3 10*3/uL (ref 0.1–1.0)
MONOS PCT: 10 %
NEUTROS ABS: 1.4 10*3/uL — AB (ref 1.7–7.7)
Neutrophils Relative %: 45 %
Platelets: 178 10*3/uL (ref 150–400)
RBC: 3.96 MIL/uL (ref 3.87–5.11)
RDW: 12.1 % (ref 11.5–15.5)
WBC: 3.2 10*3/uL — ABNORMAL LOW (ref 4.0–10.5)

## 2016-11-17 LAB — COMPREHENSIVE METABOLIC PANEL
ALT: 27 U/L (ref 14–54)
ANION GAP: 5 (ref 5–15)
AST: 31 U/L (ref 15–41)
Albumin: 3.1 g/dL — ABNORMAL LOW (ref 3.5–5.0)
Alkaline Phosphatase: 59 U/L (ref 38–126)
BILIRUBIN TOTAL: 0.5 mg/dL (ref 0.3–1.2)
BUN: 9 mg/dL (ref 6–20)
CO2: 29 mmol/L (ref 22–32)
Calcium: 8.3 mg/dL — ABNORMAL LOW (ref 8.9–10.3)
Chloride: 102 mmol/L (ref 101–111)
Creatinine, Ser: 0.7 mg/dL (ref 0.44–1.00)
GFR calc non Af Amer: >60 mL/min (ref >60)
Glucose, Bld: 138 mg/dL — ABNORMAL HIGH (ref 65–99)
POTASSIUM: 4.1 mmol/L (ref 3.5–5.1)
Sodium: 136 mmol/L (ref 135–145)
Total Protein: 6 g/dL — ABNORMAL LOW (ref 6.5–8.1)

## 2016-11-17 LAB — APTT: APTT: 32 s (ref 24–36)

## 2016-11-17 LAB — GLUCOSE, CAPILLARY
GLUCOSE-CAPILLARY: 110 mg/dL — AB (ref 65–99)
Glucose-Capillary: 160 mg/dL — ABNORMAL HIGH (ref 65–99)
Glucose-Capillary: 172 mg/dL — ABNORMAL HIGH (ref 65–99)

## 2016-11-17 LAB — PROTIME-INR
INR: 0.96
Prothrombin Time: 12.8 seconds (ref 11.4–15.2)

## 2016-11-17 LAB — PHOSPHORUS: Phosphorus: 3.7 mg/dL (ref 2.5–4.6)

## 2016-11-17 LAB — MAGNESIUM: Magnesium: 1.9 mg/dL (ref 1.7–2.4)

## 2016-11-17 MED ORDER — PREDNISONE 10 MG (21) PO TBPK: 10.0000 mg | ORAL_TABLET | Freq: Every day | ORAL | 0 refills | Status: DC

## 2016-11-17 MED ORDER — INSULIN NPH (HUMAN) (ISOPHANE) 100 UNIT/ML ~~LOC~~ SUSP: 30.0000 [IU] | Freq: Two times a day (BID) | SUBCUTANEOUS | 11 refills | Status: DC

## 2016-11-17 MED ORDER — DM-GUAIFENESIN ER 30-600 MG PO TB12: 1.0000 | ORAL_TABLET | Freq: Two times a day (BID) | ORAL | 0 refills | Status: DC

## 2016-11-17 MED ORDER — GUAIFENESIN-DM 100-10 MG/5ML PO SYRP: 5.0000 mL | ORAL_SOLUTION | ORAL | 0 refills | Status: DC | PRN

## 2016-11-17 MED ORDER — TRAMADOL HCL 50 MG PO TABS: 50.0000 mg | ORAL_TABLET | Freq: Four times a day (QID) | ORAL | 0 refills | Status: DC | PRN

## 2016-11-17 MED ORDER — OSELTAMIVIR PHOSPHATE 75 MG PO CAPS: 75.0000 mg | ORAL_CAPSULE | Freq: Two times a day (BID) | ORAL | 0 refills | Status: DC

## 2016-11-17 NOTE — Progress Notes (Addendum)
Title: A Randomized, Double-Blind, Placebo-Controlled Dose Ranging Study Evaluating the Safety Pharmacokinetics and Clinical Benefit of FLU-IGIV in Hospitalized Patients with Serious Influenza A infection. IA-001 (ClinicalTrials.gov Identifier: [id number], Protocol No: IA-001, [facility] Protocol #[id number])  RESEARCH SUBJECT. This research study is sponsored by [employer]  ...................................................................................................   PI note  S: day of discharged  D/w Dr Dorena BodoShiek treating MD - patient dc not delayed because of headache. This is because of volume overload and o2 issues. Currently plan is for discharge 11/17/2016. Silvio PateMarion A Belmonte reports good diuresis with lasix and improved dyspnea. STill overall fatigued from flu. But feels ready to go home Headache remained 5 of 10 overnight and this morning/noon got another toradol and headache now improved to 2 of 10. STill not resolved. Is mild. No associated nausea, vomit, diarrhea, diplopia or any focal neuro symptoms  O Vitals:   11/16/16 2112 11/17/16 0249 11/17/16 0547 11/17/16 0811  BP: 135/83  (!) 158/72   Pulse: 78  75   Resp: 18  18   Temp: 98.7 F (37.1 C)  98.8 F (37.1 C)   TempSrc: Oral  Oral   SpO2: 97% 90% 95% 95%  Weight:      Height:       o2 is on room air   EXAM BP (!) 158/72 (BP Location: Right Arm)   Pulse 75   Temp 98.8 F (37.1 C) (Oral)   Resp 18   Ht 5\' 5"  (1.651 m)   Wt 124.2 kg (273 lb 13 oz)   SpO2 95%   BMI 45.56 kg/m   General Appearance:    Alert, cooperative, no distress, appears stated age. LOOKS BETTER. OBESE - baseline  Head:    Normocephalic, without obvious abnormality, atraumatic  Eyes:    PERRL, conjunctiva/corneas clear, EOM's intact both eyes  Ears:    Normal external ear canals, both ears  Nose:   Nares normal, septum midline, mucosa normal, no drainage    or sinus tenderness  Throat:   Lips, mucosa, and tongue normal;  teeth and gums normal  Neck:   Supple, symmetrical, trachea midline, no adenopathy;    thyroid:  no enlargement/tenderness/nodules; no carotid   bruit or JVD  Back:     Symmetric, no curvature, ROM normal, no CVA tenderness  Lungs:     Clear to auscultation bilaterally, respirations unlabored  Chest Wall:    No tenderness or deformity   Heart:    Regular rate and rhythm, S1 and S2 normal, no murmur, rub   or gallop  Breast Exam:    Not done  Abdomen:     Soft, non-tender, bowel sounds active all four quadrants,    no masses, no organomegaly  Genitalia:    Normal female without lesion, discharge or tenderness  Rectal:  Not done  Extremities:  not done  Pulses:   2+ and symmetric all extremities  Skin:   Skin color, texture, turgor normal, no rashes or lesions  Lymph nodes:   Cervical, supraclavicular, and axillary nodes normal  Neurologic:   CNII-XII intact, normal strength, sensation and reflexes    Throughout. No nystagmus   PULMONARY No results for input(s): PHART, PCO2ART, PO2ART, HCO3, TCO2, O2SAT in the last 168 hours.  Invalid input(s): PCO2, PO2  CBC  Recent Labs Lab 11/15/16 1655 11/16/16 0623 11/17/16 0604  HGB 11.2* 11.2* 11.3*  HCT 34.5* 34.2* 34.4*  WBC 3.6* 4.5 3.2*  PLT 164 171 178    COAGULATION  Recent Labs  Lab 11/14/16 1623 11/15/16 1655 11/17/16 1205  INR 1.10 0.96 0.96    CARDIAC   Recent Labs Lab 11/14/16 1623 11/15/16 0608 11/15/16 1244  TROPONINI <0.03 <0.03 <0.03   No results for input(s): PROBNP in the last 168 hours.   CHEMISTRY  Recent Labs Lab 11/14/16 1623 11/15/16 0608 11/15/16 1655 11/16/16 0623 11/17/16 0604  NA 135 135 133* 131* 136  K 3.9 4.5 3.9 4.2 4.1  CL 101 100* 100* 98* 102  CO2 30 30 28 26 29   GLUCOSE 175* 182* 151* 128* 138*  BUN 8 8 7  5* 9  CREATININE 0.72 0.71 0.65 0.63 0.70  CALCIUM 7.8* 7.9* 7.7* 7.7* 8.3*  MG 1.8 1.9  --  1.9 1.9  PHOS 2.6 3.1  --  3.2 3.7   Estimated Creatinine Clearance:  104 mL/min (by C-G formula based on SCr of 0.7 mg/dL).   LIVER  Recent Labs Lab 11/14/16 1623 11/15/16 0608 11/15/16 1655 11/16/16 0623 11/17/16 0604 11/17/16 1205  AST 31 27 26 24 31   --   ALT 26 25 22 21 27   --   ALKPHOS 55 53 49 48 59  --   BILITOT 0.5 0.4 0.2* 0.4 0.5  --   PROT 6.3* 6.4* 5.9* 5.8* 6.0*  --   ALBUMIN 3.2* 3.0* 2.9* 3.0* 3.1*  --   INR 1.10  --  0.96  --   --  0.96     INFECTIOUS  Recent Labs Lab 11/13/16 1617  LATICACIDVEN 1.71     ENDOCRINE CBG (last 3)   Recent Labs  11/16/16 2102 11/17/16 0714 11/17/16 1150  GLUCAP 162* 110* 160*         IMAGING x 11/17/2016 - none for 11/17/2016    A/P - Research  S/p IV IG v Placebo for flu A  - Acute Hypoxemic resp failure - resolved  - Headache - improved, mild, ongoing, Rx with toradol improving. AE - study drug related. Mechanism: unknown. There is no  Evidence of meningitis or mengismus. Not SAE per disucssion with treating hospitalist and this PI assessment. Neuro intact   - Leukopenia - post study drug, not clinically significant, mild, - will monitor  - : anemia, - mild, unchanged, unrelated to study drug. Due to flu illness  - hyp0nateremia - resolved, illness related. Mild not due to study drug  - volume overload - new 11/16/16 - Rx lasix - resolved as of 11/17/2016 . Not study drug related. Is due to illness   -followup  - will do 8th day fu per protocol - thanked for research voluntary participation - agrees to followup - contact info given to her by research staff  - she will go with copy of ICF    Dr. Kalman Shan, M.D., [facility].C.P Pulmonary and Critical Care Medicine Staff Physician [facility] Pager: [phone], If no answer or between  15:00h - 7:00h: call [phone]  11/17/2016 1:18 PM

## 2016-11-17 NOTE — Discharge Summary (Signed)
Physician Discharge Summary  ROZELLA SERVELLO OEH:[id number] DOB: 1961/05/06 DOA: 11/13/2016  PCP: Charolette Forward, PA-C  Admit date: 11/13/2016 Discharge date: 11/17/2016  Admitted From: Home Disposition:  Home  Recommendations for Outpatient Follow-up:  1. Follow up with PCP in 1-2 weeks 2. Follow up with Dr. Brand Males for Research Flu-IVIG follow up 3. Follow up on Thyroid Nodule found and have outpatient biopsy/FNA 4. Please obtain BMP/CBC in one week 5. 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 Cough has progressively gotten worse and is productive and cloudy/clear. Cough has increased since Tuesday and patient was going to come to ED yesterday but could not because of [place]. Has not had an appetitie but has been trying to rehydrate with po fluids. Patient states she has had headaches as well. Been around sick contact and was around them on Tuesday. She feels like she has been wheezing and has developed pain in chest from coughing. She has felt nauseous but has not vomited and this AM she developed a headache. States myalgias got so bad that she came in for evaluation. She presented to the ED and was found to be hypoxic on room air. TRH was asked to see patient and admit for Hypoxia and it was found that she was positive for Influenza A. Patient  acutely worsened 11/14/16 and was enrolled in a Clinical Trial using Flu-IVIG. Was able to wean from O2 and on Room Air saturating well however states she gets dyspneic while ambulating. On 11/16/16 she woke up with a severe headache and states she never really gets headaches. When discussed she states the severe headache developed after infusion of the Flu-IVIG. Was worked up for her headache for concern of adverse reaction to Drug and improved with NSAIDs. MRI was Negative. Had to be given Diuresis x1 as she appeared slightly volume overload from IVF rehydration she received. When ambulated later that night she did not require any more O2 via [place] and passed walk screen. She this AM was doing well and deemed medically stable to go home and follow up with PCP as an outpatient.   Discharge Diagnoses:  Principal Problem:   Influenza A Active Problems:   Diabetes mellitus ([place])   Hypoxia   Respiratory failure with hypoxia (HCC)   SIRS (systemic inflammatory response syndrome) (HCC)   Anxiety and depression   Thyroid nodule   Hyponatremia   Research subject  1. Acute Hypoxic Respiratory Failure likely 2/2 to Influenza A, improved -DuoNeb Breathing Treatments TID were given in [place] -Initial CXR showed The heart size and mediastinal contours are within normal limits. Both lungs are clear. The visualized skeletal structures are unremarkable. -Respiratory Panel by PCR showed Influenza A H3 Positive but rest of panel Negative -PE R/o'd Out as Chest CT Angio showed Evaluation for pulmonary emboli is limited due to artifact and respiratory motion but no convincing pulmonary emboli are identified. -2  View Chest X-Ray yesterday showed Increased interstitial markings in the lungs may represent edema or atypical infection. No focal infiltrate. *given 1 time dose of IV lasix and improved -Walk Screen Prior to D/C to evaluate for Home O2 did not show that she required O2 -Encouraged  Ambulation at Home  2. SIRS 2/2 to Influenza A, improving -Patient was Tachycardic, Tachypenic and Had Low Grade Fever on Admission;  -Blood Cx's done and showed NGTD <24 Hours -Had Significant Myalgias but improved tremendously -Supportive Care with Oseltamivir 75 mg BID for total 5 days -Stopped IVF Rehydration -Acetaminophen and Tramadol for Myalgia pain; May need Robaxin; Received Ketorolac in the ED -Continue Mucinex DM 1 tab po BID -**Patient Involved in FLU-IVIG Clinical Trial because she met inclusion criteria.  -Follow up with Dr. Chase Caller for research trial   3. Severe Headache -No focal deficits except that she has had paraesthesias in hands from her Diabetes -Stated it was Severe 9/10 and happened after infusion of Flu-IVIG Research drug and states she does not previously get headaches -Because of Concern of Side Effects and Risk of Aspectic Meninigits, Dr. Chase Caller notified -MRI of Brain with and without Contrast ordered to Evaluate after discussion with Dr. Tomasita Crumble the NeuroRadiologist -MRI Normal examination. No sign of meningitis or other intracranial pathology by MRI. -Continued with Tylenol/Tramadol;  -Gave Ketorolac NSAID 30 mg IV and improved symptoms -Continue NSAIDs as necessary as an outpatient.   4. Thyroid Nodule -CT Angio showed Stable 1.6 cm nodule in the right thyroid lobe.  -Ultrasound of the Thyroid showed Bilateral nodules. The 2.4 cm right mid lobe nodule meets criteria for fine needle aspiration biopsy. -TSH was 0.340 and Free T4 was 1.03 -Will need outpatient Bx as will not do it this visit because of her clinical illness -Follow up with Endocrinology and PCP   5. Insulin Dependent Diabetes Mellitus Type 2 complicated by Diabetic Neuropathy -C/w Home Saxagliptin 5 mg po daily and home Insulin NPH; recommend [employer] Brand -HbA1c was 10.1; HbA1c was 9.2 in 2010 -Continue to Monitor CBG's closely at home -UA showed >500 Urine Glucose -C/w Home  Gabapentin 400 mg po TID -Recommended [employer] Brand Reli-On NPH Insulin which is $25.00 a bottle  6. Depression/Anxiety -C/w Home Citalopram 40 mg po Daily and Home Clonazepam 1 mg po BID  7. Lumbar Disc Disease -C/w Cyclobenzaprine and Tramadol for Pain from Bulging discs   8. Hyponatremia mild -Improved to 131 -D/C'd IVF Rehydration at 50 mL/hr and Repeat CMP as an outpatient   9. Non-Sustained VTACH -Had 7 beat Run of VTach -Had Troponin <0.03 x 3 -Electrolytes WNL -Follow up with PCP and if necessary Cardiolgy  Discharge Instructions  Discharge Instructions    Call MD for:  difficulty breathing, headache or visual disturbances    Complete by:  As directed    Call MD for:  extreme fatigue    Complete by:  As directed    Call MD for:  persistant dizziness or light-headedness    Complete by:  As directed    Call MD for:  persistant nausea and vomiting    Complete by:  As directed    Call MD for:  redness, tenderness, or signs of infection (pain, swelling, redness, odor or green/yellow discharge around incision site)    Complete by:  As directed    Call MD for:  severe uncontrolled pain    Complete by:  As directed    Call MD for:  temperature >100.4    Complete  by:  As directed    Diet - low sodium heart healthy    Complete by:  As directed    Low Sodium Carb Modified Diet   Discharge instructions    Complete by:  As directed    Follow up with PCP at D/C. Take all medications as prescribed. If symptoms worsen or change please return to ED   Increase activity slowly    Complete by:  As directed      Allergies as of 11/17/2016   No Known Allergies     Medication List    TAKE these medications   acetaminophen-codeine 300-30 MG tablet Commonly known as:  TYLENOL #3 Take 1 tablet by mouth every 4 (four) hours as needed for moderate pain.   BREO ELLIPTA 100-25 MCG/INH Aepb Generic drug:  fluticasone furoate-vilanterol Inhale 1 puff into the lungs daily.    citalopram 40 MG tablet Commonly known as:  CELEXA Take 40 mg by mouth daily.   clonazePAM 1 MG tablet Commonly known as:  KLONOPIN Take 1 mg by mouth 2 (two) times daily as needed for anxiety (sleep).   cyclobenzaprine 10 MG tablet Commonly known as:  FLEXERIL Take 1 tablet (10 mg total) by mouth 3 (three) times daily as needed for muscle spasms.   dextromethorphan-guaiFENesin 30-600 MG 12hr tablet Commonly known as:  MUCINEX DM Take 1 tablet by mouth 2 (two) times daily.   guaiFENesin-dextromethorphan 100-10 MG/5ML syrup Commonly known as:  ROBITUSSIN DM Take 5 mLs by mouth every 4 (four) hours as needed for cough.   gabapentin 400 MG capsule Commonly known as:  NEURONTIN Take 1 capsule (400 mg total) by mouth 3 (three) times daily.   hydrochlorothiazide 12.5 MG tablet Commonly known as:  HYDRODIURIL Take 12.5 mg by mouth daily.   HYDROcodone-acetaminophen 5-325 MG tablet Commonly known as:  NORCO/VICODIN Take 1 tablet by mouth every 6 (six) hours as needed for moderate pain.   ibuprofen 800 MG tablet Commonly known as:  ADVIL,MOTRIN Take 1 tablet (800 mg total) by mouth every 8 (eight) hours as needed.   insulin NPH Human 100 UNIT/ML injection Commonly known as:  HUMULIN N,NOVOLIN N Inject 0.3 mLs (30 Units total) into the skin 2 (two) times daily before a meal.   ondansetron 8 MG disintegrating tablet Commonly known as:  ZOFRAN-ODT Take 1 tablet (8 mg total) by mouth every 8 (eight) hours as needed for nausea.   oseltamivir 75 MG capsule Commonly known as:  TAMIFLU Take 1 capsule (75 mg total) by mouth 2 (two) times daily.   predniSONE 10 MG (21) Tbpk tablet Commonly known as:  STERAPRED UNI-PAK 21 TAB Take 1 tablet (10 mg total) by mouth daily.   saxagliptin HCl 5 MG Tabs tablet Commonly known as:  ONGLYZA Take 5 mg by mouth daily.   traMADol 50 MG tablet Commonly known as:  ULTRAM Take 1 tablet (50 mg total) by mouth every 6 (six) hours as needed for  moderate pain.      Follow-up Information    Couillard, Stephani Police, PA-C. Call in 1 week(s).   Specialty:  Physician Assistant Contact information: [address] [place] [address] [phone]          No Known Allergies  Consultations:  Research Dr. Chase Caller from Pulmonary  Dr. Tomasita Crumble in NeuroRadiology via Telephone  Procedures/Studies: Dg Chest 2 View  Result Date: 11/16/2016 CLINICAL DATA:  Productive cough. EXAM: CHEST  2 VIEW COMPARISON:  November 13, 2016 chest x-ray and CT  scan FINDINGS: No pneumothorax. The heart size is borderline. The hila and mediastinum are unchanged. Increased interstitial markings in the lungs in the interval. IMPRESSION: Increased interstitial markings in the lungs may represent edema or atypical infection. No focal infiltrate. Electronically Signed   By: Dorise Bullion III M.D   On: 11/16/2016 14:40   Dg Chest 2 View  Result Date: 11/13/2016 CLINICAL DATA:  Productive cough for 2 days. EXAM: CHEST  2 VIEW COMPARISON:  08/24/2015 FINDINGS: The heart size and mediastinal contours are within normal limits. Both lungs are clear. The visualized skeletal structures are unremarkable. IMPRESSION: No active cardiopulmonary disease. Electronically Signed   By: Kerby Moors M.D.   On: 11/13/2016 11:33   Ct Angio Chest Pe W Or Wo Contrast  Result Date: 11/13/2016 CLINICAL DATA:  Productive cough for 2 days. Chest pain with coughing. EXAM: CT ANGIOGRAPHY CHEST WITH CONTRAST TECHNIQUE: Multidetector CT imaging of the chest was performed using the standard protocol during bolus administration of intravenous contrast. Multiplanar CT image reconstructions and MIPs were obtained to evaluate the vascular anatomy. CONTRAST:  100 mL of Isovue 370 COMPARISON:  None. FINDINGS: Cardiovascular: The heart size is unchanged and unremarkable. The thoracic aorta is non aneurysmal with no dissection. Evaluation of pulmonary arteries is limited due to mild respiratory  motion, artifact, and timing of contrast. Within this limitation, no pulmonary emboli identified. Mediastinum/Nodes: The 1.7 cm nodule in the right thyroid lobe is unchanged by my measurement. No effusions. The esophagus is normal. No adenopathy. Lungs/Pleura: Scattered subsegmental atelectasis is identified with no suspicious nodules, masses, or focal infiltrates. The central airways are normal. No pneumothorax. Upper Abdomen: No acute abnormality. Musculoskeletal: No chest wall abnormality. No acute or significant osseous findings. Review of the MIP images confirms the above findings. IMPRESSION: 1. Evaluation for pulmonary emboli is limited due to artifact and respiratory motion but no convincing pulmonary emboli are identified. 2. Stable 1.6 cm nodule in the right thyroid lobe. An ultrasound could better evaluate. Electronically Signed   By: Dorise Bullion III M.D   On: 11/13/2016 16:04   Mr Brain W Wo Contrast  Result Date: 11/16/2016 CLINICAL DATA:  Inflow un is a infection. Severe headache beginning today. Treated with IVIG. Assess for meningitis EXAM: MRI HEAD WITHOUT AND WITH CONTRAST TECHNIQUE: Multiplanar, multiecho pulse sequences of the brain and surrounding structures were obtained without and with intravenous contrast. CONTRAST:  77m MULTIHANCE GADOBENATE DIMEGLUMINE 529 MG/ML IV SOLN COMPARISON:  None. FINDINGS: Brain: The brain has normal appearance without evidence of malformation, atrophy, old or acute small or large vessel infarction, hemorrhage, hydrocephalus or extra-axial collection. No pituitary abnormality. After contrast administration, no abnormal enhancement occurs. Vascular: Major vessels at the base of the brain show flow. Skull and upper cervical spine: Normal Sinuses/Orbits: Clear/ normal. Other: None significant. IMPRESSION: Normal examination. No sign of meningitis or other intracranial pathology by MRI. Electronically Signed   By: MNelson ChimesM.D.   On: 11/16/2016 12:10    UKoreaThyroid  Result Date: 11/14/2016 CLINICAL DATA:  Incidental on other study.  CT chest earlier today. EXAM: THYROID ULTRASOUND TECHNIQUE: Ultrasound examination of the thyroid gland and adjacent soft tissues was performed. COMPARISON:  None. FINDINGS: Parenchymal Echotexture: Mildly heterogenous Isthmus: 0.5 cm Right lobe: 4.9 x 2.0 x 2.0 cm. Left lobe: 4.3 x 1.5 x 1.6 cm. _________________________________________________________ Estimated total number of nodules >/= 1 cm: 1 Number of spongiform nodules >/=  2 cm not described below (TR1): 0 Number of mixed cystic  and solid nodules >/= 1.5 cm not described below ([place]): Nodule # 1: Location: Right; Mid Maximum size: 2.4 cm; Other 2 dimensions: 1.7 x 1.4 cm Composition: mixed cystic and solid (1) Echogenicity: isoechoic (1) Shape: taller-than-wide (3) Margins: smooth (0) Echogenic foci: none (0) ACR TI-RADS total points: 5. ACR TI-RADS risk category: TR4 (4-6 points). ACR TI-RADS recommendations: **Given size (>/= 1.5 cm) and appearance, fine needle aspiration of this moderately suspicious nodule should be considered based on TI-RADS criteria. 0.8 cm left mid lobe hypoechoic nodule has a benign appearance. IMPRESSION: Bilateral nodules. The 2.4 cm right mid lobe nodule meets criteria for fine needle aspiration biopsy. The above is in keeping with the ACR TI-RADS recommendations - J Am Coll Radiol 2017;14:587-595. Electronically Signed   By: Marybelle Killings M.D.   On: 11/14/2016 07:43   Subjective: Seen and examined at bedside and Headache was improved to a 2/10 with NSAIDs. No N/V. Still felt weak but better than she came in and no SOB. No other complaints and ready to go home.  Discharge Exam: Vitals:   11/16/16 2112 11/17/16 0547  BP: 135/83 (!) 158/72  Pulse: 78 75  Resp: 18 18  Temp: 98.7 F (37.1 C) 98.8 F (37.1 C)   Vitals:   11/16/16 2112 11/17/16 0249 11/17/16 0547 11/17/16 0811  BP: 135/83  (!) 158/72   Pulse: 78  75   Resp: 18  18    Temp: 98.7 F (37.1 C)  98.8 F (37.1 C)   TempSrc: Oral  Oral   SpO2: 97% 90% 95% 95%  Weight:      Height:       General: Pt is alert, awake, not in acute distress Cardiovascular: RRR, S1/S2 +, no rubs, no gallops Respiratory: CTA bilaterally, mild wheezing, no rhonchi Abdominal: Soft, NT, ND, bowel sounds + Extremities: no edema, no cyanosis  The results of significant diagnostics from this hospitalization (including imaging, microbiology, ancillary and laboratory) are listed below for reference.     Microbiology: Recent Results (from the past 240 hour(s))  Culture, blood (Routine X 2) w Reflex to ID Panel     Status: None (Preliminary result)   Collection Time: 11/14/16  2:53 PM  Result Value Ref Range Status   Specimen Description BLOOD LEFT HAND  Final   Special Requests BOTTLES DRAWN AEROBIC AND ANAEROBIC 5CC  Final   Culture   Final    NO GROWTH 2 DAYS Performed at [facility], [place] [address] [place], [place] [address]    Report Status PENDING  Incomplete  Culture, blood (Routine X 2) w Reflex to ID Panel     Status: None (Preliminary result)   Collection Time: 11/14/16  2:53 PM  Result Value Ref Range Status   Specimen Description BLOOD RIGHT ARM  Final   Special Requests BOTTLES DRAWN AEROBIC AND ANAEROBIC 5CC  Final   Culture   Final    NO GROWTH 2 DAYS Performed at [facility], [place] [address] [place], [place] [address]    Report Status PENDING  Incomplete  Respiratory Panel by PCR     Status: Abnormal   Collection Time: 11/15/16 12:03 PM  Result Value Ref Range Status   Adenovirus NOT DETECTED NOT DETECTED Final   Coronavirus 229E NOT DETECTED NOT DETECTED Final   Coronavirus HKU1 NOT DETECTED NOT DETECTED Final   Coronavirus NL63 NOT DETECTED NOT DETECTED Final   Coronavirus OC43 NOT DETECTED NOT DETECTED Final   Metapneumovirus NOT  DETECTED NOT DETECTED Final   Rhinovirus / Enterovirus NOT DETECTED NOT DETECTED Final   Influenza A H3  DETECTED (A) NOT DETECTED Final   Influenza B NOT DETECTED NOT DETECTED Final   Parainfluenza Virus 1 NOT DETECTED NOT DETECTED Final   Parainfluenza Virus 2 NOT DETECTED NOT DETECTED Final   Parainfluenza Virus 3 NOT DETECTED NOT DETECTED Final   Parainfluenza Virus 4 NOT DETECTED NOT DETECTED Final   Respiratory Syncytial Virus NOT DETECTED NOT DETECTED Final   Bordetella pertussis NOT DETECTED NOT DETECTED Final   Chlamydophila pneumoniae NOT DETECTED NOT DETECTED Final   Mycoplasma pneumoniae NOT DETECTED NOT DETECTED Final    Comment: Performed at [facility], [place] [address], [place], [place] [address]    Labs: BNP (last 3 results) No results for input(s): BNP in the last 8760 hours. Basic Metabolic Panel:  Recent Labs Lab 11/14/16 1623 11/15/16 0608 11/15/16 1655 11/16/16 0623 11/17/16 0604  NA 135 135 133* 131* 136  K 3.9 4.5 3.9 4.2 4.1  CL 101 100* 100* 98* 102  CO2 _0 GLUCOSE 175* 182* 151* 128* 138*  BUN _1 5* 9  CREATININE 0.72 0.71 0.65 0.63 0.70  CALCIUM 7.8* 7.9* 7.7* 7.7* 8.3*  MG 1.8 1.9  --  1.9 1.9  PHOS 2.6 3.1  --  3.2 3.7   Liver Function Tests:  Recent Labs Lab 11/14/16 1623 11/15/16 0608 11/15/16 1655 11/16/16 0623 11/17/16 0604  AST _2 ALT _3 ALKPHOS 55 53 49 48 59  BILITOT 0.5 0.4 0.2* 0.4 0.5  PROT 6.3* 6.4* 5.9* 5.8* 6.0*  ALBUMIN 3.2* 3.0* 2.9* 3.0* 3.1*   No results for input(s): LIPASE, AMYLASE in the last 168 hours. No results for input(s): AMMONIA in the last 168 hours. CBC:  Recent Labs Lab 11/14/16 1623 11/15/16 0608 11/15/16 1655 11/16/16 0623 11/17/16 0604  WBC 5.0 3.8* 3.6* 4.5 3.2*  NEUTROABS 3.0 2.0 1.7 2.8 1.4*  HGB 11.7* 11.8* 11.2* 11.2* 11.3*  HCT 35.8* 36.2 34.5* 34.2* 34.4*  MCV 88.8 89.6 89.4 89.5 86.9  PLT 152 163 164 171 178   Cardiac Enzymes:  Recent Labs Lab 11/14/16 1623 11/15/16 0608 11/15/16 1244  TROPONINI <0.03 <0.03 <0.03    BNP: Invalid input(s): POCBNP CBG:  Recent Labs Lab 11/16/16 1308 11/16/16 1602 11/16/16 2102 11/17/16 0714 11/17/16 1150  GLUCAP 134* 202* 162* 110* 160*   D-Dimer No results for input(s): DDIMER in the last 72 hours. Hgb A1c  Recent Labs  11/15/16 0608  HGBA1C 10.3*   Lipid Profile No results for input(s): CHOL, HDL, LDLCALC, TRIG, CHOLHDL, LDLDIRECT in the last 72 hours. Thyroid function studies No results for input(s): TSH, T4TOTAL, T3FREE, THYROIDAB in the last 72 hours.  Invalid input(s): FREET3 Anemia work up No results for input(s): VITAMINB12, FOLATE, FERRITIN, TIBC, IRON, RETICCTPCT in the last 72 hours. Urinalysis    Component Value Date/Time   COLORURINE YELLOW 11/14/2016 0519   APPEARANCEUR CLEAR 11/14/2016 0519   LABSPEC 1.044 (H) 11/14/2016 0519   PHURINE 5.0 11/14/2016 0519   GLUCOSEU >=500 (A) 11/14/2016 0519   HGBUR NEGATIVE 11/14/2016 0519   BILIRUBINUR NEGATIVE 11/14/2016 0519   KETONESUR 5 (A) 11/14/2016 0519   PROTEINUR 30 (A) 11/14/2016 0519   UROBILINOGEN 0.2 07/26/2012 1850   NITRITE NEGATIVE 11/14/2016 0519   LEUKOCYTESUR NEGATIVE 11/14/2016 0519   Sepsis Labs Invalid input(s): PROCALCITONIN,  WBC,  [place] Microbiology Recent Results (from the past 240 hour(s))  Culture, blood (Routine X 2) w Reflex to ID Panel     Status: None (Preliminary result)   Collection Time: 11/14/16  2:53 PM  Result Value Ref Range Status   Specimen Description BLOOD LEFT HAND  Final   Special Requests BOTTLES DRAWN AEROBIC AND ANAEROBIC 5CC  Final   Culture   Final    NO GROWTH 2 DAYS Performed at [facility], [place] [address], [place], [place] [address]    Report Status PENDING  Incomplete  Culture, blood (Routine X 2) w Reflex to ID Panel     Status: None (Preliminary result)   Collection Time: 11/14/16  2:53 PM  Result Value Ref Range Status   Specimen Description BLOOD RIGHT ARM  Final   Special Requests BOTTLES DRAWN AEROBIC AND  ANAEROBIC 5CC  Final   Culture   Final    NO GROWTH 2 DAYS Performed at [facility], [place] [address] [place], [place] [address]    Report Status PENDING  Incomplete  Respiratory Panel by PCR     Status: Abnormal   Collection Time: 11/15/16 12:03 PM  Result Value Ref Range Status   Adenovirus NOT DETECTED NOT DETECTED Final   Coronavirus 229E NOT DETECTED NOT DETECTED Final   Coronavirus HKU1 NOT DETECTED NOT DETECTED Final   Coronavirus NL63 NOT DETECTED NOT DETECTED Final   Coronavirus OC43 NOT DETECTED NOT DETECTED Final   Metapneumovirus NOT DETECTED NOT DETECTED Final   Rhinovirus / Enterovirus NOT DETECTED NOT DETECTED Final   Influenza A H3 DETECTED (A) NOT DETECTED Final   Influenza B NOT DETECTED NOT DETECTED Final   Parainfluenza Virus 1 NOT DETECTED NOT DETECTED Final   Parainfluenza Virus 2 NOT DETECTED NOT DETECTED Final   Parainfluenza Virus 3 NOT DETECTED NOT DETECTED Final   Parainfluenza Virus 4 NOT DETECTED NOT DETECTED Final   Respiratory Syncytial Virus NOT DETECTED NOT DETECTED Final   Bordetella pertussis NOT DETECTED NOT DETECTED Final   Chlamydophila pneumoniae NOT DETECTED NOT DETECTED Final   Mycoplasma pneumoniae NOT DETECTED NOT DETECTED Final    Comment: Performed at [facility], [place] [address] [place], [place] [address]   Time coordinating discharge: Over 30 minutes  SIGNED:  Kerney Elbe, DO [facility] 11/17/2016, 2:18 PM Pager [phone]  If 7PM-7AM, please contact night-coverage [email or web address] Password [email or web address]

## 2016-11-17 NOTE — Telephone Encounter (Signed)
I took care of it for husband  Dr. Kalman ShanMurali Arlone Lenhardt, M.D., [facility] CenterF.C.C.P Pulmonary and Critical Care Medicine Staff Physician [facility] Pager: [phone], If no answer or between  15:00h - 7:00h: call [phone]  11/17/2016 5:11 PM

## 2016-11-17 NOTE — Care Management Note (Signed)
Case Management Note  Patient Details  Name: Veronica [name] MRN: [id number] Date of Birth: 05/19/1961  Subjective/Objective:   Referral for resources for patient's parents-Provided w/private sitter list(out of pocket cost,independent decision,custodial level care), meals on wheels resource tel#, & uber-patient voiced understanding. From home. No further CM needs.                 Action/Plan:d/c home.   Expected Discharge Date:  11/17/16               Expected Discharge Plan:  Home/Self Care  In-House Referral:     Discharge planning Services  CM Consult, Medication Assistance  Post Acute Care Choice:    Choice offered to:     DME Arranged:    DME Agency:     HH Arranged:    HH Agency:     Status of Service:  Completed, signed off  If discussed at [employer] Length of Stay Meetings, dates discussed:    Additional Comments:  Lanier ClamMahabir, Latandra Loureiro, RN 11/17/2016, 1:57 PM

## 2016-11-19 LAB — CULTURE, BLOOD (ROUTINE X 2)
CULTURE: NO GROWTH
Culture: NO GROWTH

## 2016-11-21 ENCOUNTER — Other Ambulatory Visit (INDEPENDENT_AMBULATORY_CARE_PROVIDER_SITE_OTHER): Payer: BLUE CROSS/BLUE SHIELD

## 2016-11-21 ENCOUNTER — Ambulatory Visit (INDEPENDENT_AMBULATORY_CARE_PROVIDER_SITE_OTHER): Payer: BLUE CROSS/BLUE SHIELD | Admitting: Internal Medicine

## 2016-11-21 DIAGNOSIS — J09X2 Influenza due to identified novel influenza A virus with other respiratory manifestations: Secondary | ICD-10-CM | POA: Diagnosis not present

## 2016-11-21 DIAGNOSIS — Z006 Encounter for examination for normal comparison and control in clinical research program: Secondary | ICD-10-CM | POA: Diagnosis not present

## 2016-11-21 LAB — GLUCOSE, RANDOM: GLUCOSE: 96 mg/dL (ref 70–99)

## 2016-11-21 NOTE — Progress Notes (Signed)
Title: A Randomized, Double-Blind, Placebo-Controlled Dose Ranging Study Evaluating the Safety Pharmacokinetics and Clinical Benefit of FLU-IGIV in Hospitalized Patients with Serious Influenza A infection. IA-001 (ClinicalTrials.gov Identifier: [id number], Protocol No: IA-001, [facility] Protocol #[id number])  RESEARCH SUBJECT. This research study is sponsored by [employer]   The investigational product is called NP-025 aka FLU-IGIV or anti-influenza immune globulin intravenous. It is produced from source plasma collected from [place] Education officer, environmental (FDA) licensed plasma collection establishments from healthy donors who have recovered from influenza (convalescent) and/or were vaccinated against seasonal influenza strains. The plasma contains a relatively high concentration of polyclonal antibodies directed against seasonal influenza strains, specifically influenza A strains H1N1 ([place], [place]) and H3N2 ([place]). It is a glycoprotein of 150-160 kilodaltons against Hemagluttinin (HA) and Neuraminidase (NA) surface proteins.   Key Inclusion Criteria: Adult patients; locally determined positive influenza A infection (Rapid Antigen Test or PCR) from a specimen obtained within 2 days prior to randomization; onset of symptoms </= 6 days prior to randomization; experiencing >/= 1 respiratory symptom (cough, sore throat, nasal congestion) and >/= 1 constitutional symptom (headache, myalgia, feverishness or fatigue); NEW score >/=3 at screening; must have an active order for a minimum 5 day course of oseltamivir (75mg /twice daily).  Key Exclusion Criteria: History of hypersensitivity to blood or plasma products; history of allergy to latex or rubber; pregnancy or lactation; known IgA deficiency; medical conditions for which receipt of a 500 mL volume of IV fluid may be dangerous; a pre-existing condition or use of medication that may place the individual at a  substantially increased risk of thrombosis; anticipated life expectancy < 90 days; confirmed bacterial pneumonia or any concurrent respiratory viral infection that is not influenza A.  Key other data: Side effects with infusion  Incidence Occurrence Side effect  Common 1-15%, typically < 5% First 30 minutes of infusion Back pain, abdominal pain, nausea, vomiting  Common 1-15%, typically < 5% First few minutes to several hours after infusion Chills, fever, headache, myalgia, fatigue  Rare Seconds to several hours after infusion Hypersensitivity reaction (happens with IgA deficiency), flushing, facial swelling, dyspnea, cyanosis, shock, cardiac arrest, pneumonitis, renal failure, aseptic meningitis, hemolysis, viral infection  Rare Seconds to several hours after infusion Thrombosis (at risk patients are those with a history of DVT, arterial thrombosis, multiple cardiovascular risk factors, impaired cardiac output, advanced age, coagulation disorders, prolonged immobilization, use of estrogen, indwelling catheters, known/suspected hyperviscosity)  Recommendation: For patients who are at risk of developing thrombotic events, administer NP-025 at the minimum rate of infusion practicable, not to exceed 2 mL/min. Ensure adequate hydration in patients before administration. Monitor  Rare 1-6 hours after infusion Transfusion related Acute Lung Injury (TRALI)  Rare  Hemolysis (dose related, happens in total > 2g/kg and non-O blood group), severe hemolysis may lead to renal dysfunction/failure.  Recommendation: Check hemoglobin pre- and post 36-96h and 7-10 days transfusion  Rare Several hours to two  days after infusion Aseptic Meningitis (AMS) (dose related, happens in total > 2g/kg)   NOTE: NP-025 contains 10% maltose - will interfere with glucose measurements if non-glucose specific measurement devices are not used. Results in false high glucose levels can  result in increased insulin -> life threatening  hypoglycemia  ...................................................................................................  Clinical Research Coordinator / Research RN note : This visit for Subject Veronica [name] with 1960-11-02 on 11/21/2016 for the above protocol is Day 8 and is for purpose of Follow Up. The consent for this encounter is under Protocol  Version 2.0 and  is currently IRB approved. Subject expressed continued interest and consent in continuing as a study subject. Subject thanked for participation. The purpose for this encounter : re-consent, Day 8 procedures  Day 8 procedures completed per protocol.  See EMR for local lab results.  Patient reports that her headache subsided one day after hospital discharge.  Patient has not yet completed course of tamiflu, patient has one dose remaining.  Patient reports no changes in medication, no changes in health except improvement in flu symptoms.  Patient was re-consented (LAR provided initial consent while patient was hospitalized).  Subject had read consent form prior to visit.  See paper documentation in research chart for detailed documentation on informed consent process.  Patient was accompanied by her spouse for the entire visit.  In this visit 11/21/2016 the subject will be evaluated by investigator named Dr. Marchelle Gearingamaswamy. This research coordinator has verified that the investigator is uptodate with his training.  Signed by Kinnie FeilStacey Phelps, RN Research Nurse Office [phone] [facility]  [place], [place] 5:12 PM 11/21/2016

## 2016-11-21 NOTE — Telephone Encounter (Signed)
MR came to triage and stated that he attempted to call this medication in for the pt and he stated that he was on hold for a while for the pharmacy and then the call was dropped.  Called in the tamiflu for the pt and nothing further is needed.

## 2016-11-26 NOTE — Progress Notes (Addendum)
Title: A Randomized, Double-Blind, Placebo-Controlled Dose Ranging Study Evaluating the Safety Pharmacokinetics and Clinical Benefit of FLU-IGIV in Hospitalized Patients with Serious Influenza A infection. IA-001 (ClinicalTrials.gov Identifier: [id number], Protocol No: IA-001, [facility] Protocol #[id number]#20172016)  RESEARCH SUBJECT. This research study is sponsored by [employer]  ...................................................................................................   PI note  11/21/16:   S: d8 visit for above study. Doing well. No  Headache anymore  O:     General Appearance:    Alert, cooperative, no distress, appears stated age. LOOS better  Head:    Normocephalic, without obvious abnormality, atraumatic  Eyes:    PERRL, conjunctiva/corneas clear,   Ears:    Normal TM's and external ear canals, both ears  Nose:   Nares normal, septum midline, mucosa normal, no drainage    or sinus tenderness  Throat:   Lips, mucosa, and tongue normal; teeth and gums normal  Neck:   Supple, symmetrical, trachea midline, no adenopathy;    thyroid:  no enlargement/tenderness/nodules; no carotid   bruit or JVD  Back:     Symmetric, no curvature, ROM normal, no CVA tenderness  Lungs:     Clear to auscultation bilaterally, respirations unlabored  Chest Wall:    No tenderness or deformity   Heart:    Regular rate and rhythm, S1 and S2 normal, no murmur, rub   or gallop  Breast Exam:    Not done  Abdomen:     Soft, non-tender, bowel sounds active all four quadrants,    no masses, no organomegaly  Genitalia:  Not done  Rectal:   not done  Extremities:   Extremities normal, atraumatic, no cyanosis or edema  Pulses:   2+ and symmetric all extremities  Skin:   Skin color, texture, turgor normal, no rashes or lesions  Lymph nodes:   Cervical, supraclavicular, and axillary nodes normal  Neurologic:   CNII-XII intact, normal strength, sensation and reflexes    throughout      A Research - flu A study  P Follow protocol   Dr. Kalman ShanMurali Courtnay Petrilla, M.D., [facility].P Pulmonary and Critical Care Medicine Staff Physician [facility] [place] [facility] Pager: [phone], If no answer or between  15:00h - 7:00h: call [phone]  12/03/2016 9:22 PM Note signed late

## 2016-11-28 ENCOUNTER — Other Ambulatory Visit (INDEPENDENT_AMBULATORY_CARE_PROVIDER_SITE_OTHER): Payer: BLUE CROSS/BLUE SHIELD

## 2016-11-28 ENCOUNTER — Ambulatory Visit (INDEPENDENT_AMBULATORY_CARE_PROVIDER_SITE_OTHER): Payer: BLUE CROSS/BLUE SHIELD | Admitting: Internal Medicine

## 2016-11-28 DIAGNOSIS — Z006 Encounter for examination for normal comparison and control in clinical research program: Secondary | ICD-10-CM | POA: Diagnosis not present

## 2016-11-28 DIAGNOSIS — J09X2 Influenza due to identified novel influenza A virus with other respiratory manifestations: Secondary | ICD-10-CM | POA: Diagnosis not present

## 2016-11-28 LAB — CBC WITH DIFFERENTIAL/PLATELET
BASOS PCT: 0.5 % (ref 0.0–3.0)
Basophils Absolute: 0 10*3/uL (ref 0.0–0.1)
EOS ABS: 0.1 10*3/uL (ref 0.0–0.7)
Eosinophils Relative: 1.5 % (ref 0.0–5.0)
HEMATOCRIT: 39.6 % (ref 36.0–46.0)
HEMOGLOBIN: 13.1 g/dL (ref 12.0–15.0)
Lymphocytes Relative: 27 % (ref 12.0–46.0)
Lymphs Abs: 1.7 10*3/uL (ref 0.7–4.0)
MCHC: 33 g/dL (ref 30.0–36.0)
MCV: 88.1 fl (ref 78.0–100.0)
MONO ABS: 0.7 10*3/uL (ref 0.1–1.0)
Monocytes Relative: 11.3 % (ref 3.0–12.0)
Neutro Abs: 3.7 10*3/uL (ref 1.4–7.7)
Neutrophils Relative %: 59.7 % (ref 43.0–77.0)
Platelets: 294 10*3/uL (ref 150.0–400.0)
RBC: 4.5 Mil/uL (ref 3.87–5.11)
RDW: 13.2 % (ref 11.5–15.5)
WBC: 6.2 10*3/uL (ref 4.0–10.5)

## 2016-11-28 LAB — COMPREHENSIVE METABOLIC PANEL
ALT: 49 U/L — AB (ref 0–35)
AST: 31 U/L (ref 0–37)
Albumin: 3.7 g/dL (ref 3.5–5.2)
Alkaline Phosphatase: 75 U/L (ref 39–117)
BILIRUBIN TOTAL: 0.8 mg/dL (ref 0.2–1.2)
BUN: 11 mg/dL (ref 6–23)
CALCIUM: 9.1 mg/dL (ref 8.4–10.5)
CHLORIDE: 101 meq/L (ref 96–112)
CO2: 35 meq/L — AB (ref 19–32)
CREATININE: 0.78 mg/dL (ref 0.40–1.20)
GFR: 97.98 mL/min (ref >60.00)
GLUCOSE: 142 mg/dL — AB (ref 70–99)
Potassium: 4.8 mEq/L (ref 3.5–5.1)
SODIUM: 139 meq/L (ref 135–145)
Total Protein: 7 g/dL (ref 6.0–8.3)

## 2016-11-28 LAB — PROTIME-INR
INR: 1 ratio (ref 0.8–1.0)
PROTHROMBIN TIME: 10.8 s (ref 9.6–13.1)

## 2016-11-28 LAB — APTT: APTT: 27 s (ref 23.4–32.7)

## 2016-11-28 NOTE — Progress Notes (Signed)
Title: A Randomized, Double-Blind, Placebo-Controlled Dose Ranging Study Evaluating the Safety Pharmacokinetics and Clinical Benefit of FLU-IGIV in Hospitalized Patients with Serious Influenza A infection. IA-001 (ClinicalTrials.gov Identifier: [id number], Protocol No: IA-001, [facility] Protocol #[id number]#20172016)  RESEARCH SUBJECT. This research study is sponsored by [employer]   The investigational product is called NP-025 aka FLU-IGIV or anti-influenza immune globulin intravenous. It is produced from source plasma collected from [place] Education officer, environmentalood and Drug Administration (FDA) licensed plasma collection establishments from healthy donors who have recovered from influenza (convalescent) and/or were vaccinated against seasonal influenza strains. The plasma contains a relatively high concentration of polyclonal antibodies directed against seasonal influenza strains, specifically influenza A strains H1N1 ([place]) and H3N2 ([place]). It is a glycoprotein of 150-160 kilodaltons against Hemagluttinin (HA) and Neuraminidase (NA) surface proteins.   Key Inclusion Criteria: Adult patients; locally determined positive influenza A infection (Rapid Antigen Test or PCR) from a specimen obtained within 2 days prior to randomization; onset of symptoms </= 6 days prior to randomization; experiencing >/= 1 respiratory symptom (cough, sore throat, nasal congestion) and >/= 1 constitutional symptom (headache, myalgia, feverishness or fatigue); NEW score >/=3 at screening; must have an active order for a minimum 5 day course of oseltamivir (75mg /twice daily).  Key Exclusion Criteria: History of hypersensitivity to blood or plasma products; history of allergy to latex or rubber; pregnancy or lactation; known IgA deficiency; medical conditions for which receipt of a 500 mL volume of IV fluid may be dangerous; a pre-existing condition or use of medication that may place the individual at a  substantially increased risk of thrombosis; anticipated life expectancy < 90 days; confirmed bacterial pneumonia or any concurrent respiratory viral infection that is not influenza A.  Key other data: Side effects with infusion  Incidence Occurrence Side effect  Common 1-15%, typically < 5% First 30 minutes of infusion Back pain, abdominal pain, nausea, vomiting  Common 1-15%, typically < 5% First few minutes to several hours after infusion Chills, fever, headache, myalgia, fatigue  Rare Seconds to several hours after infusion Hypersensitivity reaction (happens with IgA deficiency), flushing, facial swelling, dyspnea, cyanosis, shock, cardiac arrest, pneumonitis, renal failure, aseptic meningitis, hemolysis, viral infection  Rare Seconds to several hours after infusion Thrombosis (at risk patients are those with a history of DVT, arterial thrombosis, multiple cardiovascular risk factors, impaired cardiac output, advanced age, coagulation disorders, prolonged immobilization, use of estrogen, indwelling catheters, known/suspected hyperviscosity)  Recommendation: For patients who are at risk of developing thrombotic events, administer NP-025 at the minimum rate of infusion practicable, not to exceed 2 mL/min. Ensure adequate hydration in patients before administration. Monitor  Rare 1-6 hours after infusion Transfusion related Acute Lung Injury (TRALI)  Rare  Hemolysis (dose related, happens in total > 2g/kg and non-O blood group), severe hemolysis may lead to renal dysfunction/failure.  Recommendation: Check hemoglobin pre- and post 36-96h and 7-10 days transfusion  Rare Several hours to two  days after infusion Aseptic Meningitis (AMS) (dose related, happens in total > 2g/kg)   NOTE: NP-025 contains 10% maltose - will interfere with glucose measurements if non-glucose specific measurement devices are not used. Results in false high glucose levels can  result in increased insulin -> life threatening  hypoglycemia  ...................................................................................................  Clinical Research Coordinator / Research RN note : This visit for Subject Veronica [name] with 03/16/1961 on 11/28/2016 for the above protocol is Visit Day 15 and is for purpose of Day 15 procedures. The consent for this encounter is  under Protocol Version 2.0 and is currently IRB approved. Subject expressed continued interest and consent in continuing as a study subject. Subject thanked for participation.   See paper research chart for documentation of ongoing flu symptoms and vital signs.  Patient experienced a 5 min, sharp 5/10 pain, headache this morning which has since subsided.  No other new headaches.    The patient reports feeling anxious and "jittery" since the day after discharge (same time as taking prednisone for wheeze).  Patient saw her primary care provider yesterday, where she had elevated blood sugars, due to prednisone taper.  Patient has had some changes in her medications:  Prednisone taper was completed 01/Feb/18. Patient was prescribed lantus at time of discharge, and patient was unable to afford so she did not take this medication.  Patient continued on her pre-hospitalization home dosing of insulin.   Patient was taking onglyza (saxagliptin) for diabetes management but recently saw commercials on TV about side effects and became concerned.  She said she discussed these concerns with her PCP and stopped taking onglyza, with last dose on 01/Feb/18.  Patient is taking Novolin R and Novolin N for diabetes management. Patient has completed full course of tamiflu.    Patient to return in approximately two weeks for Day 30 follow up visit.  Study staff to contact her in a week or so to schedule this visit.    In this visit 11/28/2016 the subject will be evaluated by investigator named Dr. Kalman Shan, MD. This research coordinator has verified that the investigator is  up to date with his training logs  Signed by Kinnie Feil, RN Research Nurse Office [phone]  Clinical Research Coordinator / Nurse [facility]  [place], [place] 1:33 PM 11/28/2016

## 2016-12-03 ENCOUNTER — Encounter: Payer: Self-pay | Admitting: Internal Medicine

## 2016-12-03 NOTE — Progress Notes (Signed)
Title: A Randomized, Double-Blind, Placebo-Controlled Dose Ranging Study Evaluating the Safety Pharmacokinetics and Clinical Benefit of FLU-IGIV in Hospitalized Patients with Serious Influenza A infection. IA-001 (ClinicalTrials.gov Identifier: [id number], Protocol No: IA-001, [facility] Protocol #[id number]#20172016)  RESEARCH SUBJECT. This research study is sponsored by [employer]   ................................................................................................... S: Above study. D15 office visit. Doing well other than headache as [facility] Doroteo Glassmanhelps and hyperglycemia due to med changes   O There were no vitals taken for this visit.  General Appearance:    Alert, cooperative, no distress, appears stated age. OBEESE  Head:    Normocephalic, without obvious abnormality, atraumatic  Eyes:    PERRL, conjunctiva/corneas clear,   Ears:    Normal TM's and external ear canals, both ears  Nose:   Nares normal, septum midline, mucosa normal, no drainage    or sinus tenderness  Throat:   Lips, mucosa, and tongue normal; teeth and gums normal  Neck:   Supple, symmetrical, trachea midline, no adenopathy;    thyroid:  no enlargement/tenderness/nodules; no carotid   bruit or JVD  Back:     Symmetric, no curvature, ROM normal, no CVA tenderness  Lungs:     Clear to auscultation bilaterally, respirations unlabored  Chest Wall:    No tenderness or deformity   Heart:    Regular rate and rhythm, S1 and S2 normal, no murmur, rub   or gallop  Breast Exam:    Not done  Abdomen:     Soft, non-tender, bowel sounds active all four quadrants,    no masses, no organomegaly  Genitalia:   not done  Rectal:   not done  Extremities:   Extremities normal, atraumatic, no cyanosis or edema  Pulses:   2+ and symmetric all extremities  Skin:   Skin color, texture, turgor normal, no rashes or lesions  Lymph nodes:   Cervical, supraclavicular, and axillary nodes normal  Neurologic:   CNII-XII  intact, normal strength, sensation and reflexes    throughout   A REsearch Flu A  P Per protocol   Dr. Kalman ShanMurali Denilson Salminen, M.D., [facility].C.P Pulmonary and Critical Care Medicine Staff Physician  System [place] [facility] Pager: [phone], If no answer or between  15:00h - 7:00h: call [phone]  12/03/2016 9:34 PM   Note signed late but reflects exam of 11/28/16

## 2016-12-04 ENCOUNTER — Other Ambulatory Visit: Payer: Self-pay | Admitting: Physician Assistant

## 2016-12-04 DIAGNOSIS — E041 Nontoxic single thyroid nodule: Secondary | ICD-10-CM

## 2016-12-05 ENCOUNTER — Encounter: Payer: Self-pay | Admitting: Physical Medicine & Rehabilitation

## 2016-12-05 ENCOUNTER — Ambulatory Visit (HOSPITAL_BASED_OUTPATIENT_CLINIC_OR_DEPARTMENT_OTHER): Payer: BLUE CROSS/BLUE SHIELD | Admitting: Physical Medicine & Rehabilitation

## 2016-12-05 ENCOUNTER — Telehealth: Payer: Self-pay | Admitting: Physical Medicine & Rehabilitation

## 2016-12-05 ENCOUNTER — Encounter: Payer: BLUE CROSS/BLUE SHIELD | Attending: Physical Medicine & Rehabilitation

## 2016-12-05 VITALS — BP 127/84 | HR 92 | Resp 14

## 2016-12-05 DIAGNOSIS — M1288 Other specific arthropathies, not elsewhere classified, other specified site: Secondary | ICD-10-CM

## 2016-12-05 DIAGNOSIS — E1142 Type 2 diabetes mellitus with diabetic polyneuropathy: Secondary | ICD-10-CM

## 2016-12-05 DIAGNOSIS — M47816 Spondylosis without myelopathy or radiculopathy, lumbar region: Secondary | ICD-10-CM | POA: Insufficient documentation

## 2016-12-05 DIAGNOSIS — M4316 Spondylolisthesis, lumbar region: Secondary | ICD-10-CM

## 2016-12-05 MED ORDER — TRAMADOL HCL 50 MG PO TABS: 100.0000 mg | ORAL_TABLET | Freq: Two times a day (BID) | ORAL | 1 refills | Status: DC

## 2016-12-05 MED ORDER — GABAPENTIN 400 MG PO CAPS: 400.0000 mg | ORAL_CAPSULE | Freq: Three times a day (TID) | ORAL | 1 refills | Status: DC

## 2016-12-05 NOTE — Telephone Encounter (Signed)
patient wanted a refill on her flexeril called into [facility] on [address]  - it was discussed with kirsteins and iot was determined for the patient to try the new medication until her next visit and if she wants to switch back tehn she can - it was also relayed that the patient could have added weight gain if on the flexeril. Called and spoke with patient  - she agreed and was happy for the phone call back

## 2016-12-05 NOTE — Telephone Encounter (Signed)
Noted  

## 2016-12-05 NOTE — Progress Notes (Signed)
Subjective:    Patient ID: Veronica [name], female    DOB: [birth year], 56 y.o.   MRN: [id number]  HPI   Last physical medicine and rehabilitation visit October 2017. She states she did not follow up because of multiple deaths in the family as well as some health issues. Listed below Hospitalized with Influenza type A 1/18-1/22  Had PCP eval for CP, EKG negative, diagnosed with anxiety attack Also had worsening of neuropathy pain (diabetes) Worsening of diabetic control after being on prednisone for breathing problems  Numbness in Big toe right side  Prior MRI 2016 L4-5 facet arthropathy with degenerative spondy Pain Inventory Average Pain 10 Pain Right Now 8 My pain is sharp, stabbing and tingling  In the last 24 hours, has pain interfered with the following? General activity 8 Relation with others 10 Enjoyment of life 9 What TIME of day is your pain at its worst? night Sleep (in general) Poor  Pain is worse with: walking, bending, sitting, standing and some activites Pain improves with: rest, heat/ice and medication Relief from Meds: 7  Mobility walk without assistance  Function Do you have any goals in this area?  no  Neuro/Psych weakness numbness tremor tingling spasms depression anxiety loss of taste or smell  Prior Studies Any changes since last visit?  yes  Physicians involved in your care Any changes since last visit?  no   Family History  Problem Relation Age of Onset  . Diabetes Mother     type 2  . Diabetes      cousin type 1  . Diabetes Brother   . Congestive Heart Failure Brother   . Emphysema Paternal Grandfather   . Tuberculosis Paternal Grandfather    Social History   Social History  . Marital status: Married    Spouse name: N/A  . Number of children: N/A  . Years of education: N/A   Social History Main Topics  . Smoking status: Former Smoker    Years: 4.00    Quit date: 10/28/1987  . Smokeless tobacco: Never Used   Comment: 1 pack per 2 months  . Alcohol use No  . Drug use: No  . Sexual activity: Not Asked   Other Topics Concern  . None   Social History Narrative   Originally from [place]. Always lived in [place] travel to [place]. 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 She has no pets currently. No bird or hot tub exposure. She reports that there has been mold in her basement before that was treated twice. She also has mold in her bathroom & bedroom.    Past Surgical History:  Procedure Laterality Date  . NASAL RECONSTRUCTION    . PARTIAL COLECTOMY    . TONSILLECTOMY AND ADENOIDECTOMY    . UVULOPALATOPHARYNGOPLASTY     Past Medical History:  Diagnosis Date  . Cough   . Diabetes mellitus   . H/O diverticulitis of colon   . Heart attack 08/2009  . Neuropathy (HCC)   . OSA (obstructive sleep apnea)   . Sciatica    BP 127/84   Pulse 92   Resp 14   SpO2 94%   Opioid Risk Score:   Fall Risk Score:  `1  Depression screen PHQ 2/9  Depression screen PHQ 2/9 08/15/2016  Decreased Interest 2  Down, Depressed, Hopeless 2  PHQ - 2 Score 4  Altered sleeping 3  Tired, decreased energy 3  Change in appetite 2  Feeling bad or failure about yourself  1  Trouble concentrating 1  Moving slowly or fidgety/restless 1  Suicidal thoughts 0  PHQ-9 Score 15  Difficult doing work/chores Very difficult     Review of Systems  Constitutional: Positive for appetite change and unexpected weight change.  HENT: Negative.   Eyes: Negative.   Respiratory: Positive for shortness of breath.   Cardiovascular: Negative.   Gastrointestinal: Negative.   Endocrine:       High blood sugar  Genitourinary: Positive for dysuria.  Musculoskeletal: Negative.   Skin: Positive for rash.  Allergic/Immunologic: Negative.   Neurological: Negative.   Hematological: Negative.   Psychiatric/Behavioral: Negative.   All other systems  reviewed and are negative.      Objective:   Physical Exam  Constitutional: She is oriented to person, place, and time. She appears well-developed and well-nourished.  HENT:  Head: Normocephalic and atraumatic.  Eyes: Conjunctivae and EOM are normal. Pupils are equal, round, and reactive to light.  Neck: Normal range of motion.  Cardiovascular: Normal rate and regular rhythm.   Pulmonary/Chest: Effort normal and breath sounds normal.  Abdominal: Soft. Bowel sounds are normal. She exhibits no distension.  Musculoskeletal: She exhibits no edema.  Neurological: She is alert and oriented to person, place, and time.  Psychiatric: She has a normal mood and affect.  Nursing note and vitals reviewed.  Lumbar spine. Mild tenderness palpation lumbar paraspinals. Lumbar range of motion, reduced 25% flexion, extension, lateral bending and rotation.       Assessment & Plan:  1. Lumbar spondylosis without myelopathy. She has chronic pain which limits her mobility and activity level. I've recommended aquatic exercise for her. She plans to start back on this. Will request records from Dr. Ethelene Halamos office, has had some type of injections will need to clarify. Minimize narcotic analgesics, restart tramadol 100 mg twice a day I do not see any muscle spasm at this point this is not acute pain in will avoid muscle relaxers except with exacerbations  2. Diabetes with neuropathy Restart gabapentin 400 mg 3 times a day  Nurse practitioner visit in one month, if tramadol fails to improve her overall pain Consider trial of Tylenol with codeine No. 3

## 2016-12-12 DIAGNOSIS — J09X2 Influenza due to identified novel influenza A virus with other respiratory manifestations: Secondary | ICD-10-CM

## 2016-12-12 DIAGNOSIS — Z006 Encounter for examination for normal comparison and control in clinical research program: Secondary | ICD-10-CM

## 2016-12-12 NOTE — Progress Notes (Signed)
Title: A Randomized, Double-Blind, Placebo-Controlled Dose Ranging Study Evaluating the Safety Pharmacokinetics and Clinical Benefit of FLU-IGIV in Hospitalized Patients with Serious Influenza A infection. IA-001 (ClinicalTrials.gov Identifier: [id number], Protocol No: IA-001, [facility] Protocol #[id number])  RESEARCH SUBJECT. This research study is sponsored by [employer]   The investigational product is called NP-025 aka FLU-IGIV or anti-influenza immune globulin intravenous. It is produced from source plasma collected from [place] Education officer, environmentalood and Drug Administration (FDA) licensed plasma collection establishments from healthy donors who have recovered from influenza (convalescent) and/or were vaccinated against seasonal influenza strains. The plasma contains a relatively high concentration of polyclonal antibodies directed against seasonal influenza strains, specifically influenza A strains H1N1 ([place]) and H3N2 ([place]). It is a glycoprotein of 150-160 kilodaltons against Hemagluttinin (HA) and Neuraminidase (NA) surface proteins.   ................................................................................................... Clinical Research Coordinator / Research RN note : This visit for Subject Veronica [name] with 12/06/1960 on 12/12/2016 for the above protocol is Visit/Encounter Day 30  and is for purpose of RESEARCH. The consent for this encounter is under Protocol Version 2.0 and IS currently IRB approved. Veronica [name] expressed continued interest and consent in continuing as a study subject.   In this visit 12/12/2016 the subject will be evaluated by investigator named Kalman ShanMurali Ramaswamy, MD. This research coordinator has verified that the investigator is uptodate with his training logs.  Veronica [name] reports feeling well this afternoon with full resumption of normal activities.  Patient has no complaints at this time and appears pleasant. Subject  reports on Friday, 11/28/16 she was at the grocery store and started feeling shaky, short of breath, diaphoretic, and felt that her "heart was racing". Per subject, CBG at the time was normal.  Subject states that because of these symptoms, she did not take her gabapentin and celexa for 3 days. Patient reports symptoms were ongoing throughout the weekend and therefore she saw her PCP on Tuesday, 12/02/16. Per PCP, patient was having an anxiety attack and was told to resume her home medications. Patient reports on 12/04/16 symptoms had resolved. Discussed above findings with PI Ramaswamy. An AE of an anxiety attack will be captured with moderate intensity, no relation to study drug, and outcome resolved. Day 30 follow up assessments completed per protocol.  Day 60 visit has been scheduled and confirmed with patient for Tuesday, January 13, 2017 at 3:30 PM. Appointment card given to patient. Original copy of IA-001 Subject Participation Card also given to patient. Patient instructed to call her research doctor if any questions or concerns. Subject thanked for participation in research and contribution to science.   Toula MoosFrances J Daniel Johndrow, RN Clinical Research Nurse  [facility], [facility] Office: [phone]  5:04 PM 12/12/2016

## 2016-12-16 ENCOUNTER — Ambulatory Visit
Admission: RE | Admit: 2016-12-16 | Discharge: 2016-12-16 | Disposition: A | Discharge status: Home / Self Care | Payer: BLUE CROSS/BLUE SHIELD | Source: Ambulatory Visit | Attending: Physician Assistant | Admitting: Physician Assistant

## 2016-12-16 ENCOUNTER — Other Ambulatory Visit (HOSPITAL_COMMUNITY)
Admission: RE | Admit: 2016-12-16 | Discharge: 2016-12-16 | Disposition: A | Discharge status: Home / Self Care | Payer: BLUE CROSS/BLUE SHIELD | Source: Ambulatory Visit | Attending: Interventional Radiology | Admitting: Interventional Radiology

## 2016-12-16 DIAGNOSIS — E041 Nontoxic single thyroid nodule: Secondary | ICD-10-CM | POA: Diagnosis present

## 2016-12-19 ENCOUNTER — Telehealth: Payer: Self-pay

## 2016-12-19 NOTE — Telephone Encounter (Signed)
Patient called stating her tramadol is not working for her and her back hurts her quite a lot, asks is there anything else that can be prescribed

## 2016-12-22 NOTE — Telephone Encounter (Signed)
Call in tylenol #3 one po BID, #60 no RF

## 2016-12-23 MED ORDER — ACETAMINOPHEN-CODEINE #3 300-30 MG PO TABS: 1.0000 | ORAL_TABLET | Freq: Two times a day (BID) | ORAL | 0 refills | Status: DC | PRN

## 2016-12-23 NOTE — Telephone Encounter (Signed)
Called to pharmacy and Ms Laural BenesJohnson notified. Will need a CSa at next appt.

## 2017-01-02 ENCOUNTER — Encounter: Payer: BLUE CROSS/BLUE SHIELD | Attending: Physical Medicine & Rehabilitation

## 2017-01-02 ENCOUNTER — Encounter: Payer: Self-pay | Admitting: Physical Medicine & Rehabilitation

## 2017-01-02 ENCOUNTER — Ambulatory Visit (HOSPITAL_BASED_OUTPATIENT_CLINIC_OR_DEPARTMENT_OTHER): Payer: BLUE CROSS/BLUE SHIELD | Admitting: Physical Medicine & Rehabilitation

## 2017-01-02 VITALS — BP 134/71 | HR 94

## 2017-01-02 DIAGNOSIS — G8929 Other chronic pain: Secondary | ICD-10-CM | POA: Diagnosis not present

## 2017-01-02 DIAGNOSIS — M1288 Other specific arthropathies, not elsewhere classified, other specified site: Secondary | ICD-10-CM

## 2017-01-02 DIAGNOSIS — M47816 Spondylosis without myelopathy or radiculopathy, lumbar region: Secondary | ICD-10-CM

## 2017-01-02 DIAGNOSIS — M545 Low back pain: Secondary | ICD-10-CM

## 2017-01-02 DIAGNOSIS — M4316 Spondylolisthesis, lumbar region: Secondary | ICD-10-CM | POA: Diagnosis not present

## 2017-01-02 DIAGNOSIS — E1142 Type 2 diabetes mellitus with diabetic polyneuropathy: Secondary | ICD-10-CM

## 2017-01-02 MED ORDER — ACETAMINOPHEN-CODEINE #4 300-60 MG PO TABS: 1.0000 | ORAL_TABLET | Freq: Two times a day (BID) | ORAL | 2 refills | Status: DC | PRN

## 2017-01-02 NOTE — Patient Instructions (Signed)
[facility] sign [facility] so I can see what injections were performed by Dr. Ethelene Halamos physical once we find out what was the name of the first procedure you had scheduled.

## 2017-01-02 NOTE — Progress Notes (Signed)
Subjective:    Patient ID: Veronica [name], female    DOB: Dec 18, 1960, 56 y.o.   MRN: [id number]  HPI Pt has had several injections with Dr Ethelene Hal epidural and medial branch blocks and RF.   First injection last 4-5 months but doesn't recall, According to records looks like L5-S1 Dr Ethelene Hal also prescribed oxycodone  Daily pain, poor sleep  Heating pad helpful Helping around the house, elderly parents  Tramadol 2 tablets three times a day Tylenol #3 takes the edge off  Some numbness in Left big toe and R thigh occurs with standing  I reviewed the notes from the office of Dr. Ethelene Hal. Reviewed MRI, 08-29-2015. Spondylolisthesis L4-L5, facet arthropathy L4-L5 and L5-S1. Mild to moderate neural foraminal narrowing Pain Inventory Average Pain 10 Pain Right Now 10 My pain is constant, burning, tingling and numb  In the last 24 hours, has pain interfered with the following? General activity 10 Relation with others 10 Enjoyment of life 10 What TIME of day is your pain at its worst? night Sleep (in general) Poor  Pain is worse with: walking, bending, sitting, standing and some activites Pain improves with: rest, heat/ice, therapy/exercise and medication Relief from Meds: 6  Mobility use a cane ability to climb steps?  yes do you drive?  yes  Function not employed: date last employed .  Neuro/Psych numbness tingling trouble walking anxiety  Prior Studies Any changes since last visit?  no  Physicians involved in your care Any changes since last visit?  no   Family History  Problem Relation Age of Onset  . Diabetes Mother     type 2  . Diabetes      cousin type 1  . Diabetes Brother   . Congestive Heart Failure Brother   . Emphysema Paternal Grandfather   . Tuberculosis Paternal Grandfather    Social History   Social History  . Marital status: Married    Spouse name: N/A  . Number of children: N/A  . Years of education: N/A   Social History Main Topics    . Smoking status: Former Smoker    Years: 4.00    Quit date: 10/28/1987  . Smokeless tobacco: Never Used     Comment: 1 pack per 2 months  . Alcohol use No  . Drug use: No  . Sexual activity: Not on file   Other Topics Concern  . Not on file   Social History Narrative   Originally from [place] Always lived in [place]. 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 She has no pets currently. No bird or hot tub exposure. She reports that there has been mold in her basement before that was treated twice. She also has mold in her bathroom & bedroom.    Past Surgical History:  Procedure Laterality Date  . NASAL RECONSTRUCTION    . PARTIAL COLECTOMY    . TONSILLECTOMY AND ADENOIDECTOMY    . UVULOPALATOPHARYNGOPLASTY     Past Medical History:  Diagnosis Date  . Cough   . Diabetes mellitus   . H/O diverticulitis of colon   . Heart attack 08/2009  . Neuropathy (HCC)   . OSA (obstructive sleep apnea)   . Sciatica    There were no vitals taken for this visit.  Opioid Risk Score:   Fall Risk Score:  `1  Depression screen PHQ 2/9  Depression screen PHQ  2/9 08/15/2016  Decreased Interest 2  Down, Depressed, Hopeless 2  PHQ - 2 Score 4  Altered sleeping 3  Tired, decreased energy 3  Change in appetite 2  Feeling bad or failure about yourself  1  Trouble concentrating 1  Moving slowly or fidgety/restless 1  Suicidal thoughts 0  PHQ-9 Score 15  Difficult doing work/chores Very difficult    Review of Systems  Constitutional: Positive for diaphoresis.  HENT: Negative.   Eyes: Negative.   Respiratory: Negative.   Cardiovascular: Negative.   Gastrointestinal: Negative.   Endocrine: Negative.   Genitourinary: Negative.   Musculoskeletal: Negative.   Skin: Negative.   Allergic/Immunologic: Negative.   Neurological: Negative.   Hematological: Negative.   Psychiatric/Behavioral:  Negative.   All other systems reviewed and are negative.      Objective:   Physical Exam  Constitutional: She is oriented to person, place, and time. She appears well-developed and well-nourished.  Neurological: She is alert and oriented to person, place, and time.  Skin: Skin is warm and dry.  Psychiatric: She has a normal mood and affect. Her behavior is normal. Judgment and thought content normal.  Nursing note and vitals reviewed. Patient has stocking distribution numbness extending to the supramalleolar area of bilateral feet. Deep tendon reflexes, patellar and Achilles are absent. Motor strength is 5/5 bilateral hip flexor, knee extensor, ankle dorsiflexor. There is no pinprick sensory deficit at the knees. Patient has diffuse lumbar tenderness starting at L4, going down to PSIS area. There is no tenderness over the greater trochanters of the hips.         Assessment & Plan:  1.  Chronic lumbar pain with lumbar DDD, lumbar facet arthropathy, no current radicular symptoms, has had some relief with pain medication and a lumbar injection  [facility] records reviewed to determine what level of spinal injection was.  L5-S1 was the first injection. Do not see the actual procedure note. I will do a translaminar given that the transforaminal injections done subsequent were not effective.  Change. Tylenol 3 to Tylenol 4 1 tablet twice a day. Controlled substance agreement and opioid consent completed. Recommend urine drug screen next visit, prior urine drug screen from October reviewed and appropriate  The [place] control substances reporting system was reviewed. There currently is no evidence of multiple prescribers for opiates and other controlled substances. Patients on chronic opioids will have this reviewed at minimum every 3 months.

## 2017-01-13 DIAGNOSIS — Z006 Encounter for examination for normal comparison and control in clinical research program: Secondary | ICD-10-CM

## 2017-01-13 DIAGNOSIS — J101 Influenza due to other identified influenza virus with other respiratory manifestations: Secondary | ICD-10-CM

## 2017-01-13 NOTE — Progress Notes (Signed)
Title: A Randomized, Double-Blind, Placebo-Controlled Dose Ranging Study Evaluating the Safety Pharmacokinetics and Clinical Benefit of FLU-IGIV in Hospitalized Patients with Serious Influenza A infection. IA-001 (ClinicalTrials.gov Identifier: [id number], Protocol No: IA-001, [facility] Protocol #[id number])  RESEARCH SUBJECT. This research study is sponsored by [employer]   The investigational product is called NP-025 aka FLU-IGIV or anti-influenza immune globulin intravenous. It is produced from source plasma collected from [place] Transport planner (FDA) licensed plasma collection establishments from healthy donors who have recovered from influenza (convalescent) and/or were vaccinated against seasonal influenza strains. The plasma contains a relatively high concentration of polyclonal antibodies directed against seasonal influenza strains, specifically influenza A strains H1N1 ([place]) and H3N2 ([place]). It is a glycoprotein of 150-160 kilodaltons against Hemagluttinin (HA) and Neuraminidase (NA) surface proteins.   Key Inclusion Criteria: Adult patients; locally determined positive influenza A infection (Rapid Antigen Test or PCR) from a specimen obtained within 2 days prior to randomization; onset of symptoms </= 6 days prior to randomization; experiencing >/= 1 respiratory symptom (cough, sore throat, nasal congestion) and >/= 1 constitutional symptom (headache, myalgia, feverishness or fatigue); NEW score >/=3 at screening; must have an active order for a minimum 5 day course of oseltamivir ('75mg'$ /twice daily).  Key Exclusion Criteria: History of hypersensitivity to blood or plasma products; history of allergy to latex or rubber; pregnancy or lactation; known IgA deficiency; medical conditions for which receipt of a 500 mL volume of IV fluid may be dangerous; a pre-existing condition or use of medication that may place the individual at a  substantially increased risk of thrombosis; anticipated life expectancy < 90 days; confirmed bacterial pneumonia or any concurrent respiratory viral infection that is not influenza A.  Key Randomization Information: Patients will be randomized to receive a single IV administration of FLU-IGIV (NP-025) or placebo (normal saline 0.9% NaCL). Two dose levels are being assessed, each a fixed dose by volume - 10 vials (450 mL) for the high dose and 5 vials (225 mL) for the low dose. Assuming a patient weight range of 60 kg to 100 kg, patients will receive approximately 0.32 - 0.53 g/kg of IgG protein for the high dose and approximately 0.16 - 0.26 g/kg for the low dose, in a single infusion.  Key other data: Side effects with infusion  Incidence Occurrence Side effect  Common 1-15%, typically < 5% First 30 minutes of infusion Back pain, abdominal pain, nausea, vomiting  Common 1-15%, typically < 5% First few minutes to several hours after infusion Chills, fever, headache, myalgia, fatigue  Rare Seconds to several hours after infusion Hypersensitivity reaction (happens with IgA deficiency), flushing, facial swelling, dyspnea, cyanosis, shock, cardiac arrest, pneumonitis, renal failure, aseptic meningitis, hemolysis, viral infection  Rare Seconds to several hours after infusion Thrombosis (at risk patients are those with a history of DVT, arterial thrombosis, multiple cardiovascular risk factors, impaired cardiac output, advanced age, coagulation disorders, prolonged immobilization, use of estrogen, indwelling catheters, known/suspected hyperviscosity)  Recommendation: For patients who are at risk of developing thrombotic events, administer NP-025 at the minimum rate of infusion practicable, not to exceed 2 mL/min. Ensure adequate hydration in patients before administration. Monitor  Rare 1-6 hours after infusion Transfusion related Acute Lung Injury (TRALI)  Rare  Hemolysis (dose related, happens in total >  2g/kg and non-O blood group), severe hemolysis may lead to renal dysfunction/failure.  Recommendation: Check hemoglobin pre- and post 36-96h and 7-10 days transfusion  Rare Several hours to two  days  after infusion Aseptic Meningitis (AMS) (dose related, happens in total > 2g/kg)   NOTE: NP-025 contains 10% maltose - will interfere with glucose measurements if non-glucose specific measurement devices are not used. Results in false high glucose levels can  result in increased insulin -> life threatening hypoglycemia  .................................................................................................... Clinical Research Coordinator / Research RN note : This visit for Subject Veronica [name] with DOB 07/06/1961 on 01/13/2017 for the above protocol is Visit Day 60  and is for purpose of research . The consent for this encounter is under Protocol Version 3.0 and is currently IRB approved. Subject expressed continued interest and consent in continuing as a study subject. Subject thanked for participation.   All procedures were completed per the above mentioned protocol. Subject feels that she is able to perform her normal daily activities. Refer to the subjects paper source binder for further documentation.   Signed by Carron CurieJennifer Castillo, CMa Clinical Research Coordinator [facility]  [place], [place] 1:48 PM 01/13/2017

## 2017-02-02 ENCOUNTER — Encounter: Payer: Self-pay | Admitting: Physical Medicine & Rehabilitation

## 2017-02-02 ENCOUNTER — Encounter: Payer: BLUE CROSS/BLUE SHIELD | Attending: Physical Medicine & Rehabilitation

## 2017-02-02 ENCOUNTER — Ambulatory Visit (HOSPITAL_BASED_OUTPATIENT_CLINIC_OR_DEPARTMENT_OTHER): Payer: BLUE CROSS/BLUE SHIELD | Admitting: Physical Medicine & Rehabilitation

## 2017-02-02 VITALS — BP 119/62 | HR 82

## 2017-02-02 DIAGNOSIS — Z79899 Other long term (current) drug therapy: Secondary | ICD-10-CM

## 2017-02-02 DIAGNOSIS — M5136 Other intervertebral disc degeneration, lumbar region: Secondary | ICD-10-CM | POA: Diagnosis not present

## 2017-02-02 DIAGNOSIS — G894 Chronic pain syndrome: Secondary | ICD-10-CM

## 2017-02-02 DIAGNOSIS — M47816 Spondylosis without myelopathy or radiculopathy, lumbar region: Secondary | ICD-10-CM | POA: Diagnosis not present

## 2017-02-02 DIAGNOSIS — Z5181 Encounter for therapeutic drug level monitoring: Secondary | ICD-10-CM | POA: Diagnosis not present

## 2017-02-02 DIAGNOSIS — M797 Fibromyalgia: Secondary | ICD-10-CM

## 2017-02-02 NOTE — Patient Instructions (Addendum)
Gabapentin increase to 4 times a day  Walk 3 times a day   Myofascial Pain Syndrome and Fibromyalgia Myofascial pain syndrome and fibromyalgia are both pain disorders. This pain may be felt mainly in your muscles.  Myofascial pain syndrome:  Always has trigger points or tender points in the muscle that will cause pain when pressed. The pain may come and go.  Usually affects your neck, upper back, and shoulder areas. The pain often radiates into your arms and hands.  Fibromyalgia:  Has muscle pains and tenderness that come and go.  Is often associated with fatigue and sleep disturbances.  Has trigger points.  Tends to be long-lasting (chronic), but is not life-threatening. Fibromyalgia and myofascial pain are not the same. However, they often occur together. If you have both conditions, each can make the other worse. Both are common and can cause enough pain and fatigue to make day-to-day activities difficult. What are the causes? The exact causes of fibromyalgia and myofascial pain are not known. People with certain gene types may be more likely to develop fibromyalgia. Some factors can be triggers for both conditions, such as:  Spine disorders.  Arthritis.  Severe injury (trauma) and other physical stressors.  Being under a lot of stress.  A medical illness. What are the signs or symptoms? Fibromyalgia  The main symptom of fibromyalgia is widespread pain and tenderness in your muscles. This can vary over time. Pain is sometimes described as stabbing, shooting, or burning. You may have tingling or numbness, too. You may also have sleep problems and fatigue. You may wake up feeling tired and groggy (fibro fog). Other symptoms may include:  Bowel and bladder problems.  Headaches.  Visual problems.  Problems with odors and noises.  Depression or mood changes.  Painful menstrual periods (dysmenorrhea).  Dry skin or eyes. Myofascial pain syndrome  Symptoms of  myofascial pain syndrome include:  Tight, ropy bands of muscle.  Uncomfortable sensations in muscular areas, such as:  Aching.  Cramping.  Burning.  Numbness.  Tingling.  Muscle weakness.  Trouble moving certain muscles freely (range of motion). How is this diagnosed? There are no specific tests to diagnose fibromyalgia or myofascial pain syndrome. Both can be hard to diagnose because their symptoms are common in many other conditions. Your health care provider may suspect one or both of these conditions based on your symptoms and medical history. Your health care provider will also do a physical exam. The key to diagnosing fibromyalgia is having pain, fatigue, and other symptoms for more than three months that cannot be explained by another condition. The key to diagnosing myofascial pain syndrome is finding trigger points in muscles that are tender and cause pain elsewhere in your body (referred pain). How is this treated? Treating fibromyalgia and myofascial pain often requires a team of health care providers. This usually starts with your primary provider and a physical therapist. You may also find it helpful to work with alternative health care providers, such as massage therapists or acupuncturists. Treatment for fibromyalgia may include medicines. This may include nonsteroidal anti-inflammatory drugs (NSAIDs), along with other medicines. Treatment for myofascial pain may also include:  NSAIDs.  Cooling and stretching of muscles.  Trigger point injections.  Sound wave (ultrasound) treatments to stimulate muscles. Follow these instructions at home:  Take medicines only as directed by your health care provider.  Exercise as directed by your health care provider or physical therapist.  Try to avoid stressful situations.  Practice relaxation techniques  to control your stress. You may want to try:  Biofeedback.  Visual imagery.  Hypnosis.  Muscle  relaxation.  Yoga.  Meditation.  Talk to your health care provider about alternative treatments, such as acupuncture or massage treatment.  Maintain a healthy lifestyle. This includes eating a healthy diet and getting enough sleep.  Consider joining a support group.  Do not do activities that stress or strain your muscles. That includes repetitive motions and heavy lifting. Where to find more information:  National Fibromyalgia Association: www.fmaware.org  Arthritis Foundation: www.arthritis.org  American Chronic Pain Association: GumSearch.nl Contact a health care provider if:  You have new symptoms.  Your symptoms get worse.  You have side effects from your medicines.  You have trouble sleeping.  Your condition is causing depression or anxiety. This information is not intended to replace advice given to you by your health care provider. Make sure you discuss any questions you have with your health care provider. Document Released: 10/13/2005 Document Revised: 03/20/2016 Document Reviewed: 07/19/2014 Elsevier Interactive Patient Education  2017 [employer].

## 2017-02-02 NOTE — Progress Notes (Signed)
Subjective:    Patient ID: Veronica [name], female    DOB: 19-Jul-1961, 56 y.o.   MRN: [id number]  HPI  56 year old female with chronic low back pain. She also states that she has episodes where everything hurts from her head to her toes. She has had no falls or recent trauma.  Numbness in both great toes Numbness in bilateral fourth and fifth digits  Gabapentin  TID Tramadol  once a day Tylenol No. 4 one to 2 tablets at bedtime Pain Inventory Average Pain 9 Pain Right Now 8 My pain is sharp, burning, stabbing, tingling and aching  In the last 24 hours, has pain interfered with the following? General activity 8 Relation with others 8 Enjoyment of life 8 What TIME of day is your pain at its worst? evening Sleep (in general) Poor  Pain is worse with: walking, bending, sitting and standing Pain improves with: heat/ice and medication Relief from Meds: 8  Mobility walk without assistance use a cane ability to climb steps?  yes do you drive?  yes  Function not employed: date last employed 2016 I need assistance with the following:  household duties  Neuro/Psych numbness tingling trouble walking spasms anxiety  Prior Studies Any changes since last visit?  no  Physicians involved in your care Any changes since last visit?  no   Family History  Problem Relation Age of Onset  . Diabetes Mother     type 2  . Diabetes      cousin type 1  . Diabetes Brother   . Congestive Heart Failure Brother   . Emphysema Paternal Grandfather   . Tuberculosis Paternal Grandfather    Social History   Social History  . Marital status: Married    Spouse name: N/A  . Number of children: N/A  . Years of education: N/A   Social History Main Topics  . Smoking status: Former Smoker    Years: 4.00    Quit date: 10/28/1987  . Smokeless tobacco: Never Used     Comment: 1 pack per 2 months  . Alcohol use No  . Drug use: No  . Sexual activity: Not on file   Other  Topics Concern  . Not on file   Social History Narrative   Originally from [place]. Always lived in [place]. [place] to [place]. 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 She has no pets currently. No bird or hot tub exposure. She reports that there has been mold in her basement before that was treated twice. She also has mold in her bathroom & bedroom.    Past Surgical History:  Procedure Laterality Date  . NASAL RECONSTRUCTION    . PARTIAL COLECTOMY    . TONSILLECTOMY AND ADENOIDECTOMY    . UVULOPALATOPHARYNGOPLASTY     Past Medical History:  Diagnosis Date  . Cough   . Diabetes mellitus   . H/O diverticulitis of colon   . Heart attack 08/2009  . Neuropathy (HCC)   . OSA (obstructive sleep apnea)   . Sciatica    There were no vitals taken for this visit.  Opioid Risk Score:   Fall Risk Score:  `1  Depression screen PHQ 2/9  Depression screen PHQ 2/9 08/15/2016  Decreased Interest 2  Down, Depressed, Hopeless 2  PHQ - 2 Score 4  Altered sleeping 3  Tired, decreased energy 3  Change in appetite 2  Feeling  bad or failure about yourself  1  Trouble concentrating 1  Moving slowly or fidgety/restless 1  Suicidal thoughts 0  PHQ-9 Score 15  Difficult doing work/chores Very difficult     Review of Systems  Constitutional: Negative.   HENT: Negative.   Eyes: Negative.   Respiratory: Negative.   Cardiovascular: Negative.   Gastrointestinal: Negative.   Endocrine: Negative.   Genitourinary: Negative.   Musculoskeletal: Negative.   Skin: Negative.   Allergic/Immunologic: Negative.   Neurological: Negative.   Hematological: Negative.   Psychiatric/Behavioral: Negative.   All other systems reviewed and are negative.      Objective:   Physical Exam  Constitutional: She is oriented to person, place, and time. She appears well-developed and well-nourished.  HENT:  Head: Normocephalic and  atraumatic.  Eyes: Conjunctivae are normal. Pupils are equal, round, and reactive to light.  Neck: Normal range of motion.  Neurological: She is alert and oriented to person, place, and time.  Psychiatric: She has a normal mood and affect.   Obese African-American female in no acute distress  Mood and affect are appropriate. She has tenderness to palpation in 18/18 fibromyalgia tender points. 5/5 strength, bilateral deltoid, biceps, triceps, grip, hip flexor, knee extensor, ankle dorsiflexor. Sensation intact to pinprick, bilateral C5, C6-C7, C8, T1 dermatome distribution, she has mildly decreased light touch, bilateral little fingers compared to the index. There is no hand intrinsic atrophy. There is no hand intrinsic weakness. Deep tendon reflexes are 1 plus bilateral biceps, triceps, brachioradialis, patellar, absent bilateral Achilles      Assessment & Plan:  1. Chronic lumbar pain. She does have L5-S1 degenerative disc. She may benefit from L5-S1 translaminar injection under fluoroscopic guidance. We'll schedule in one month  2. Widespread body pain 18/18 fibromyalgia tender points positive, she does have a history of thyroid nodule. I asked her to make sure her primary care doctor checks her TSH, T4. She probably has fibromyalgia and for that reason would increase gabapentin to 400 mg 4 times a day and may have to titrate upward from there, monitor for gabapentin side effects, if gabapentin at higher doses, i.e., 1800 mg or greater are not helpful for her widespread body pain, would consider Lyrica trial  We discussed the importance of exercise. 10 minutes, walking 3 times per day. She is to go to aquatic therapy, but her husband is undergoing total knee replacement and she needs to be at home to help him out postoperatively.  Physical medicine rehabilitation follow-up in 2 months

## 2017-02-06 LAB — TOXASSURE SELECT,+ANTIDEPR,UR

## 2017-02-06 LAB — 6-ACETYLMORPHINE,TOXASSURE ADD
6-ACETYLMORPHINE: NEGATIVE
6-acetylmorphine: NOT DETECTED ng/mg creat

## 2017-02-12 ENCOUNTER — Telehealth: Payer: Self-pay | Admitting: *Deleted

## 2017-02-12 NOTE — Telephone Encounter (Signed)
Urine drug screen for this encounter is consistent for prescribed medication 

## 2017-03-02 ENCOUNTER — Other Ambulatory Visit: Payer: Self-pay | Admitting: Physical Medicine & Rehabilitation

## 2017-03-31 ENCOUNTER — Emergency Department (HOSPITAL_COMMUNITY): Payer: BLUE CROSS/BLUE SHIELD

## 2017-03-31 ENCOUNTER — Encounter (HOSPITAL_COMMUNITY): Payer: Self-pay | Admitting: Emergency Medicine

## 2017-03-31 ENCOUNTER — Emergency Department (HOSPITAL_COMMUNITY)
Admission: EM | Admit: 2017-03-31 | Discharge: 2017-03-31 | Disposition: A | Discharge status: Home / Self Care | Payer: BLUE CROSS/BLUE SHIELD | Attending: Emergency Medicine | Admitting: Emergency Medicine

## 2017-03-31 ENCOUNTER — Other Ambulatory Visit: Payer: Self-pay

## 2017-03-31 DIAGNOSIS — R52 Pain, unspecified: Secondary | ICD-10-CM | POA: Insufficient documentation

## 2017-03-31 DIAGNOSIS — R200 Anesthesia of skin: Secondary | ICD-10-CM | POA: Insufficient documentation

## 2017-03-31 DIAGNOSIS — Z79899 Other long term (current) drug therapy: Secondary | ICD-10-CM | POA: Insufficient documentation

## 2017-03-31 DIAGNOSIS — E1140 Type 2 diabetes mellitus with diabetic neuropathy, unspecified: Secondary | ICD-10-CM | POA: Insufficient documentation

## 2017-03-31 DIAGNOSIS — E875 Hyperkalemia: Secondary | ICD-10-CM | POA: Insufficient documentation

## 2017-03-31 DIAGNOSIS — Z87891 Personal history of nicotine dependence: Secondary | ICD-10-CM | POA: Insufficient documentation

## 2017-03-31 DIAGNOSIS — R202 Paresthesia of skin: Secondary | ICD-10-CM | POA: Insufficient documentation

## 2017-03-31 DIAGNOSIS — Z794 Long term (current) use of insulin: Secondary | ICD-10-CM | POA: Insufficient documentation

## 2017-03-31 LAB — CBC WITH DIFFERENTIAL/PLATELET
Basophils Absolute: 0 10*3/uL (ref 0.0–0.1)
Basophils Relative: 0 %
EOS ABS: 0.1 10*3/uL (ref 0.0–0.7)
Eosinophils Relative: 1 %
HEMATOCRIT: 41.3 % (ref 36.0–46.0)
HEMOGLOBIN: 13.9 g/dL (ref 12.0–15.0)
LYMPHS ABS: 1.9 10*3/uL (ref 0.7–4.0)
Lymphocytes Relative: 26 %
MCH: 30 pg (ref 26.0–34.0)
MCHC: 33.7 g/dL (ref 30.0–36.0)
MCV: 89.2 fL (ref 78.0–100.0)
MONOS PCT: 6 %
Monocytes Absolute: 0.5 10*3/uL (ref 0.1–1.0)
NEUTROS ABS: 4.9 10*3/uL (ref 1.7–7.7)
NEUTROS PCT: 67 %
Platelets: 224 10*3/uL (ref 150–400)
RBC: 4.63 MIL/uL (ref 3.87–5.11)
RDW: 12.8 % (ref 11.5–15.5)
WBC: 7.3 10*3/uL (ref 4.0–10.5)

## 2017-03-31 LAB — URINALYSIS, ROUTINE W REFLEX MICROSCOPIC
Bilirubin Urine: NEGATIVE
GLUCOSE, UA: NEGATIVE mg/dL
Hgb urine dipstick: NEGATIVE
KETONES UR: 5 mg/dL — AB
LEUKOCYTES UA: NEGATIVE
NITRITE: NEGATIVE
PROTEIN: NEGATIVE mg/dL
Specific Gravity, Urine: 1.011 (ref 1.005–1.030)
pH: 8 (ref 5.0–8.0)

## 2017-03-31 LAB — COMPREHENSIVE METABOLIC PANEL
ALK PHOS: 70 U/L (ref 38–126)
ALT: 35 U/L (ref 14–54)
ANION GAP: 8 (ref 5–15)
AST: 30 U/L (ref 15–41)
Albumin: 3.9 g/dL (ref 3.5–5.0)
BILIRUBIN TOTAL: 0.7 mg/dL (ref 0.3–1.2)
BUN: 10 mg/dL (ref 6–20)
CALCIUM: 9.5 mg/dL (ref 8.9–10.3)
CO2: 29 mmol/L (ref 22–32)
Chloride: 104 mmol/L (ref 101–111)
Creatinine, Ser: 0.73 mg/dL (ref 0.44–1.00)
GFR calc non Af Amer: >60 mL/min (ref >60)
Glucose, Bld: 113 mg/dL — ABNORMAL HIGH (ref 65–99)
Potassium: 5.5 mmol/L — ABNORMAL HIGH (ref 3.5–5.1)
Sodium: 141 mmol/L (ref 135–145)
TOTAL PROTEIN: 7.2 g/dL (ref 6.5–8.1)

## 2017-03-31 LAB — POCT I-STAT TROPONIN I: Troponin i, poc: 0 ng/mL (ref 0.00–0.08)

## 2017-03-31 LAB — POTASSIUM: POTASSIUM: 3.8 mmol/L (ref 3.5–5.1)

## 2017-03-31 MED ORDER — SODIUM CHLORIDE 0.9 % IV BOLUS (SEPSIS)
500.0000 mL | Freq: Once | INTRAVENOUS | Status: AC
Start: 2017-03-31 — End: 2017-03-31
  Administered 2017-03-31: 500 mL via INTRAVENOUS

## 2017-03-31 MED ORDER — OXYCODONE-ACETAMINOPHEN 5-325 MG PO TABS
1.0000 | ORAL_TABLET | Freq: Once | ORAL | Status: AC
  Administered 2017-03-31: 1 via ORAL
  Filled 2017-03-31: qty 1

## 2017-03-31 MED ORDER — KETOROLAC TROMETHAMINE 30 MG/ML IJ SOLN
30.0000 mg | Freq: Once | INTRAMUSCULAR | Status: AC
  Administered 2017-03-31: 30 mg via INTRAVENOUS
  Filled 2017-03-31: qty 1

## 2017-03-31 MED ORDER — SODIUM CHLORIDE 0.9 % IV SOLN
Freq: Once | INTRAVENOUS | Status: AC
  Administered 2017-03-31: 18:00:00 via INTRAVENOUS

## 2017-03-31 NOTE — ED Triage Notes (Signed)
Patient complaining of numbness of both arms. Patient is also complaining about her body aches all over.

## 2017-03-31 NOTE — Discharge Instructions (Signed)
It was my pleasure taking care of you today!   Fortunately, your work up was very reassuring today. Your potassium was slightly high, but this improved with fluids you were given today.   Ibuprofen as needed for body aches. You can take up to 800mg  three times a day.  Increase hydration.   Please call your primary care doctor first thing in the morning to schedule a follow up appointment for further discussion of today's hospital visit.   Return to ER for fevers, new or worsening symptoms, any additional concerns.

## 2017-03-31 NOTE — ED Provider Notes (Signed)
[facility]-EMERGENCY DEPT Provider Note   CSN: [id number] Arrival date & time: 03/31/17  1352     History   Chief Complaint Chief Complaint  Patient presents with  . Numbness  . Generalized Body Aches    HPI Veronica [name] is a 56 y.o. female.  The history is provided by the patient and medical records. No language interpreter was used.   Veronica [name] is a 56 y.o. female  with a PMH of DM with polyneuropathy, chronic low back pain, sciatrica who presents to the Emergency Department complaining of generalized body aches and generalized weakness which began yesterday. She notes that she felt like she had no energy all day yesterday and her body was hurting "everywhere, just everywhere". She endorses associated numbness to bilateral upper extremities (entire arms) as well as aching neck pain. + chronic back pain with no acute changes. No alleviating factors. No weakness/numbness to lower extremities. Denies fever, chills, congestion, cough, dysuria, abdominal pain. No sick contacts.  Past Medical History:  Diagnosis Date  . Cough   . Diabetes mellitus   . H/O diverticulitis of colon   . Heart attack (HCC) 08/2009  . Neuropathy   . OSA (obstructive sleep apnea)   . Sciatica     Patient Active Problem List   Diagnosis Date Noted  . Lumbar degenerative disc disease 02/02/2017  . Fibromyalgia syndrome 02/02/2017  . Hyponatremia 11/14/2016  . Research subject 11/14/2016  . Hypoxia 11/13/2016  . Respiratory failure with hypoxia (HCC) 11/13/2016  . SIRS (systemic inflammatory response syndrome) (HCC) 11/13/2016  . Influenza A 11/13/2016  . Anxiety and depression 11/13/2016  . Thyroid nodule 11/13/2016  . Diabetic polyneuropathy associated with type 2 diabetes mellitus (HCC) 08/15/2016  . Chronic midline low back pain 08/15/2016  . Lumbar facet arthropathy (HCC) 08/15/2016  . Cough 08/15/2015  . OSA (obstructive sleep apnea) 08/15/2015  . Sciatica 08/15/2015  . Diabetes  mellitus (HCC) 08/15/2015  . Hiatal hernia 08/15/2015  . GERD (gastroesophageal reflux disease) 08/15/2015    Past Surgical History:  Procedure Laterality Date  . NASAL RECONSTRUCTION    . PARTIAL COLECTOMY    . TONSILLECTOMY AND ADENOIDECTOMY    . UVULOPALATOPHARYNGOPLASTY      OB History    No data available       Home Medications    Prior to Admission medications   Medication Sig Start Date End Date Taking? Authorizing Provider  citalopram (CELEXA) 40 MG tablet Take 40 mg by mouth daily.  10/01/16  Yes [provider]  clonazePAM (KLONOPIN) 1 MG tablet Take 1 mg by mouth 2 (two) times daily as needed for anxiety (sleep).  10/01/16  Yes [provider]  gabapentin (NEURONTIN) 400 MG capsule Take 1 capsule (400 mg total) by mouth 4 (four) times daily. 03/03/17  Yes Kirsteins, Victorino Sparrow, MD  insulin NPH Human (HUMULIN N,NOVOLIN N) 100 UNIT/ML injection Inject into the skin. 11/27/16  Yes [provider]  insulin regular (NOVOLIN R,HUMULIN R) 250 units/2.43mL (100 units/mL) injection Please follow sliding scale. 11/27/16  Yes [provider]  acetaminophen-codeine (TYLENOL #4) 300-60 MG tablet Take 1 tablet by mouth 2 (two) times daily as needed for moderate pain or severe pain. Patient not taking: Reported on 03/31/2017 01/02/17   Erick Colace, MD  ondansetron (ZOFRAN-ODT) 8 MG disintegrating tablet Take 1 tablet (8 mg total) by mouth every 8 (eight) hours as needed for nausea. Patient not taking: Reported on 03/31/2017 07/24/15   Elvina Sidle, MD  traMADol (ULTRAM) 50 MG tablet Take 2 tablets (100 mg total) by mouth 2 (two) times daily. Patient not taking: Reported on 03/31/2017 12/05/16   Erick ColaceKirsteins, Andrew E, MD    Family History Family History  Problem Relation Age of Onset  . Diabetes Mother        type 2  . Diabetes Unknown        cousin type 1  . Diabetes Brother   . Congestive Heart Failure Brother   . Emphysema Paternal Grandfather   .  Tuberculosis Paternal Grandfather     Social History Social History  Substance Use Topics  . Smoking status: Former Smoker    Years: 4.00    Quit date: 10/28/1987  . Smokeless tobacco: Never Used     Comment: 1 pack per 2 months  . Alcohol use No     Allergies   Patient has no known allergies.   Review of Systems Review of Systems  Musculoskeletal: Positive for back pain, myalgias and neck pain.  Skin: Negative for color change.  Neurological: Positive for numbness.  All other systems reviewed and are negative.    Physical Exam Updated Vital Signs BP (!) 123/46 (BP Location: Right Arm)   Pulse 71   Temp 98.2 F (36.8 C) (Oral)   Resp 19   Ht 5' 5.5" (1.664 m)   Wt 114.3 kg (252 lb)   SpO2 96%   BMI 41.30 kg/m   Physical Exam  Constitutional: She is oriented to person, place, and time. She appears well-developed and well-nourished. No distress.  HENT:  Head: Normocephalic and atraumatic.  Neck: Normal range of motion. Neck supple.    Cardiovascular: Normal rate, regular rhythm and normal heart sounds.   No murmur heard. Pulmonary/Chest: Effort normal and breath sounds normal. No respiratory distress.  Abdominal: Soft. She exhibits no distension. There is no tenderness.  Musculoskeletal:  Full ROM and 5/5 muscle strength in all four extremities. Good grip strength.  Neurological: She is alert and oriented to person, place, and time.  Sensation equal and intact to all four extremities.  Skin: Skin is warm and dry. Capillary refill takes less than 2 seconds. No rash noted. No erythema.  Nursing note and vitals reviewed.    ED Treatments / Results  Labs (all labs ordered are listed, but only abnormal results are displayed) Labs Reviewed  COMPREHENSIVE METABOLIC PANEL - Abnormal; Notable for the following:       Result Value   Potassium 5.5 (*)    Glucose, Bld 113 (*)    All other components within normal limits  URINALYSIS, ROUTINE W REFLEX MICROSCOPIC -  Abnormal; Notable for the following:    Color, Urine STRAW (*)    Ketones, ur 5 (*)    All other components within normal limits  POCT I-STAT, CHEM 8 - Abnormal; Notable for the following:    Potassium 6.6 (*)    Calcium, Ion 1.07 (*)    All other components within normal limits  CBC WITH DIFFERENTIAL/PLATELET  POTASSIUM  I-STAT TROPOININ, ED  POCT I-STAT TROPONIN I  I-STAT CHEM 8, ED    EKG  EKG Interpretation  Date/Time:  Tuesday March 31 2017 16:59:43 EDT Ventricular Rate:  60 PR Interval:    QRS Duration: 87 QT Interval:  463 QTC Calculation: 463 R Axis:   80 Text Interpretation:  Sinus rhythm No STEMI.  Confirmed by Alona BeneLong, Joshua [phone]([id number]) on 03/31/2017 6:37:07 PM       Radiology Dg Chest 2  View  Result Date: 03/31/2017 CLINICAL DATA:  Diffuse myalgias since yesterday. Shortness of breath. Weakness. EXAM: CHEST  2 VIEW COMPARISON:  11/16/2016. FINDINGS: Normal sized heart. Tortuous aorta. Clear lungs with normal vascularity. Minimal thoracic spine degenerative changes. IMPRESSION: No acute abnormality. Electronically Signed   By: Beckie Salts M.D.   On: 03/31/2017 14:45   Ct Cervical Spine Wo Contrast  Result Date: 03/31/2017 CLINICAL DATA:  Bilateral arm numbness EXAM: CT CERVICAL SPINE WITHOUT CONTRAST TECHNIQUE: Multidetector CT imaging of the cervical spine was performed without intravenous contrast. Multiplanar CT image reconstructions were also generated. COMPARISON:  None. FINDINGS: Alignment: There is straightening of the normally lordotic cervical spine. Skull base and vertebrae: No fracture or dislocation. Soft tissues and spinal canal: No obvious spinal hematoma. No abnormal soft tissue mass. Thyroid gland is heterogeneous without focal nodule. Disc levels: There is disc space narrowing at C6-C7 with anterior osteophyte formation. No obvious spinal stenosis. Foramina are grossly patent. Upper chest: Negative. Other: None. IMPRESSION: No acute bony pathology Degenerative  disc disease at C6-7. Thyroid gland is heterogeneous without focal nodule. Electronically Signed   By: Jolaine Click M.D.   On: 03/31/2017 16:09    Procedures Procedures (including critical care time)  Medications Ordered in ED Medications  sodium chloride 0.9 % bolus 500 mL (0 mLs Intravenous Stopped 03/31/17 1717)  oxyCODONE-acetaminophen (PERCOCET/ROXICET) 5-325 MG per tablet 1 tablet (1 tablet Oral Given 03/31/17 1609)  0.9 %  sodium chloride infusion ( Intravenous Stopped 03/31/17 1854)  ketorolac (TORADOL) 30 MG/ML injection 30 mg (30 mg Intravenous Given 03/31/17 1835)     Initial Impression / Assessment and Plan / ED Course  I have reviewed the triage vital signs and the nursing notes.  Pertinent labs & imaging results that were available during my care of the patient were reviewed by me and considered in my medical decision making (see chart for details).    GRETTELL RANSDELL is a 56 y.o. female who presents to ED for generalized body aches and generalized weakness since yesterday. Afebrile, hemodynamically stable. No abdominal tenderness. UA not infectious. Lungs clear. CXR negative. Patient does endorse numbness to bilateral upper extremities as well as aching neck pain. No meningeal signs or infectious symptoms to suggest meningitis. CT cervical with no acute pathology. CMP with K+ of 5.5 and glucose 113, otherwise wdl. Fluid bolus given. Potassium rechecked following fluids and wdl 3.8. Evaluation does not show pathology that would require ongoing emergent intervention or inpatient treatment. Patient rechecked and feels improved. Will follow up with PCP. Reasons to return to ER discussed and all questions answered.  Patient discussed with Dr. Jacqulyn Bath who agrees with treatment plan.   Final Clinical Impressions(s) / ED Diagnoses   Final diagnoses:  Numbness and tingling in both hands  Body aches  Hyperkalemia    New Prescriptions New Prescriptions   No medications on file       Aurelia Gras, Chase Picket, PA-C 03/31/17 Samul Dada, MD 03/31/17 [phone]

## 2017-04-03 LAB — POCT I-STAT, CHEM 8
BUN: 12 mg/dL (ref 6–20)
CALCIUM ION: 1.07 mmol/L — AB (ref 1.15–1.40)
CHLORIDE: 101 mmol/L (ref 101–111)
CREATININE: 0.8 mg/dL (ref 0.44–1.00)
GLUCOSE: 99 mg/dL (ref 65–99)
HCT: 40 % (ref 36.0–46.0)
Hemoglobin: 13.6 g/dL (ref 12.0–15.0)
Potassium: 6.6 mmol/L (ref 3.5–5.1)
SODIUM: 137 mmol/L (ref 135–145)
TCO2: 33 mmol/L (ref 0–100)

## 2017-04-09 ENCOUNTER — Encounter: Payer: Self-pay | Admitting: *Deleted

## 2017-04-09 DIAGNOSIS — Z8719 Personal history of other diseases of the digestive system: Secondary | ICD-10-CM | POA: Insufficient documentation

## 2017-04-17 ENCOUNTER — Ambulatory Visit (HOSPITAL_BASED_OUTPATIENT_CLINIC_OR_DEPARTMENT_OTHER): Payer: BLUE CROSS/BLUE SHIELD | Admitting: Physical Medicine & Rehabilitation

## 2017-04-17 ENCOUNTER — Encounter: Payer: BLUE CROSS/BLUE SHIELD | Attending: Physical Medicine & Rehabilitation

## 2017-04-17 DIAGNOSIS — M5416 Radiculopathy, lumbar region: Secondary | ICD-10-CM | POA: Diagnosis not present

## 2017-04-17 DIAGNOSIS — M47816 Spondylosis without myelopathy or radiculopathy, lumbar region: Secondary | ICD-10-CM | POA: Insufficient documentation

## 2017-04-17 MED ORDER — ACETAMINOPHEN-CODEINE #4 300-60 MG PO TABS: 1.0000 | ORAL_TABLET | Freq: Two times a day (BID) | ORAL | 2 refills | Status: DC | PRN

## 2017-04-17 MED ORDER — GABAPENTIN 400 MG PO CAPS: 400.0000 mg | ORAL_CAPSULE | Freq: Four times a day (QID) | ORAL | 1 refills | Status: DC

## 2017-04-17 NOTE — Progress Notes (Signed)
Left L5 S1 Translaminar Lumbar epidural steroid injection under fluoroscopic guidance  Indication: Lumbosacral radiculitis is not relieved by medication management or other conservative care and interfering with self-care and mobility.  No  anticoagulant use.  Informed consent was obtained after describing risk and benefits of the procedure with the patient, this includes bleeding, bruising, infection, paralysis and medication side effects.  The patient wishes to proceed and has given written consent.  Patient was placed in a prone position.  The lumbar area was marked and prepped with Betadine.  It was entered with a 25-gauge 1-1/2 inch needle and one mL of 1% lidocaine was injected into the skin and subcutaneous tissue.  Then a 17-gauge spinal needle was inserted under fluoroscopic guidance into the L5-S1 interlaminar space under AP and Lateral imaging.  Once needle tip of approximated the posterior elements, a loss of resistance technique was utilized with lateral imaging.  A positive loss of resistance was obtained and then confirmed by injecting 2 mL's of Omnipaque 180.  Then a solution containing 1.5 mL's of 6mg /ml Celestone and 1.5 mL's of 1% lidocaine was injected.  The patient tolerated procedure well.  Post procedure instructions were given.  Please see post procedure form.

## 2017-04-17 NOTE — Progress Notes (Signed)
  PROCEDURE RECORD [facility]   Name: Veronica [name] DOB:03/05/1961 MRN: [id number]  Date:04/17/2017  Physician: Claudette LawsAndrew Kirsteins, MD    Nurse/CMA: Bright CMA  Allergies: No Known Allergies   Consent Signed: Yes.    Is patient diabetic? Yes.    CBG today? 71  Pregnant: No. LMP: No LMP recorded. Patient is postmenopausal. (age 56-55)  Anticoagulants: no Anti-inflammatory: no Antibiotics: no  Procedure: Left L5-S1 Translaminar ESI Position: Prone Start Time: 246pm End Time: 253 pm Fluoro Time: 23  RN/CMA Bright CMA Bright CMA    Time 209pm 258pm    BP 137/69 157/87    Pulse 78 72    Respirations 16 16    O2 Sat 92 94    S/S 6 6    Pain Level 9/10 5/10     D/C home with friend Gery PrayBarry, patient A & O X 3, D/C instructions reviewed, and sits independently.

## 2017-04-17 NOTE — Patient Instructions (Signed)

## 2017-06-17 ENCOUNTER — Telehealth: Payer: Self-pay | Admitting: *Deleted

## 2017-06-17 NOTE — Telephone Encounter (Signed)
Title: A Randomized, Double-Blind, Placebo-Controlled Dose Ranging Study Evaluating the Safety Pharmacokinetics and Clinical Benefit of FLU-IGIV in Hospitalized Patients with Serious Influenza A infection. IA-001 (ClinicalTrials.gov Identifier: [id number], Protocol No: IA-001, [facility] Protocol #[id number])  RESEARCH SUBJECT. This research study is sponsored by [employer]  ...................................................................................................  This RN contacted patient to clarify when the patient began taking tramadol 100mg  BID that was prescribed on 09/Feb/2018.  Patient stated that she did not take 100mg  BID on that date but rather approximately one week after 09/Feb/2018.  Start date for medication for study reporting purposes will be listed as 16/Feb/2018.

## 2017-06-18 NOTE — Telephone Encounter (Signed)
PI co-signature and acknowledgemenjt  Dr. Kalman Shan, M.D., [facility]C.P Pulmonary and Critical Care Medicine Staff Physician [facility] Pager: [phone], If no answer or between  15:00h - 7:00h: call [phone]  06/18/2017 8:33 AM

## 2017-07-17 ENCOUNTER — Ambulatory Visit: Payer: Self-pay | Admitting: Physical Medicine & Rehabilitation

## 2017-07-17 ENCOUNTER — Encounter: Payer: Self-pay | Attending: Physical Medicine & Rehabilitation

## 2017-09-30 ENCOUNTER — Emergency Department (HOSPITAL_COMMUNITY): Payer: BLUE CROSS/BLUE SHIELD

## 2017-09-30 ENCOUNTER — Encounter (HOSPITAL_COMMUNITY): Payer: Self-pay | Admitting: Emergency Medicine

## 2017-09-30 ENCOUNTER — Emergency Department (HOSPITAL_COMMUNITY)
Admission: EM | Admit: 2017-09-30 | Discharge: 2017-09-30 | Disposition: A | Discharge status: Home / Self Care | Payer: BLUE CROSS/BLUE SHIELD | Attending: Emergency Medicine | Admitting: Emergency Medicine

## 2017-09-30 DIAGNOSIS — Z87891 Personal history of nicotine dependence: Secondary | ICD-10-CM | POA: Diagnosis not present

## 2017-09-30 DIAGNOSIS — E1142 Type 2 diabetes mellitus with diabetic polyneuropathy: Secondary | ICD-10-CM | POA: Insufficient documentation

## 2017-09-30 DIAGNOSIS — S8992XA Unspecified injury of left lower leg, initial encounter: Secondary | ICD-10-CM | POA: Diagnosis present

## 2017-09-30 DIAGNOSIS — Y999 Unspecified external cause status: Secondary | ICD-10-CM | POA: Diagnosis not present

## 2017-09-30 DIAGNOSIS — Z79899 Other long term (current) drug therapy: Secondary | ICD-10-CM | POA: Insufficient documentation

## 2017-09-30 DIAGNOSIS — W19XXXA Unspecified fall, initial encounter: Secondary | ICD-10-CM

## 2017-09-30 DIAGNOSIS — Y9389 Activity, other specified: Secondary | ICD-10-CM | POA: Diagnosis not present

## 2017-09-30 DIAGNOSIS — S82042A Displaced comminuted fracture of left patella, initial encounter for closed fracture: Secondary | ICD-10-CM

## 2017-09-30 DIAGNOSIS — Y92481 Parking lot as the place of occurrence of the external cause: Secondary | ICD-10-CM | POA: Insufficient documentation

## 2017-09-30 DIAGNOSIS — S82402A Unspecified fracture of shaft of left fibula, initial encounter for closed fracture: Secondary | ICD-10-CM | POA: Insufficient documentation

## 2017-09-30 DIAGNOSIS — Z794 Long term (current) use of insulin: Secondary | ICD-10-CM | POA: Insufficient documentation

## 2017-09-30 DIAGNOSIS — W010XXA Fall on same level from slipping, tripping and stumbling without subsequent striking against object, initial encounter: Secondary | ICD-10-CM | POA: Diagnosis not present

## 2017-09-30 MED ORDER — HYDROMORPHONE HCL 1 MG/ML IJ SOLN
1.0000 mg | Freq: Once | INTRAMUSCULAR | Status: AC
  Administered 2017-09-30: 1 mg via INTRAVENOUS
  Filled 2017-09-30: qty 1

## 2017-09-30 MED ORDER — OXYCODONE HCL 5 MG PO TABS: 5.0000 mg | ORAL_TABLET | Freq: Four times a day (QID) | ORAL | 0 refills | Status: AC | PRN

## 2017-09-30 MED ORDER — OXYCODONE HCL 5 MG PO TABS
5.0000 mg | ORAL_TABLET | Freq: Once | ORAL | Status: AC
  Administered 2017-09-30: 5 mg via ORAL
  Filled 2017-09-30: qty 1

## 2017-09-30 MED ORDER — ACETAMINOPHEN 500 MG PO TABS
1000.0000 mg | ORAL_TABLET | Freq: Once | ORAL | Status: AC
  Administered 2017-09-30: 1000 mg via ORAL
  Filled 2017-09-30: qty 2

## 2017-09-30 MED ORDER — ACETAMINOPHEN 500 MG PO TABS: 1000.0000 mg | ORAL_TABLET | Freq: Three times a day (TID) | ORAL | 0 refills | Status: AC

## 2017-09-30 MED ORDER — BACITRACIN ZINC 500 UNIT/GM EX OINT
TOPICAL_OINTMENT | CUTANEOUS | Status: AC
  Filled 2017-09-30: qty 0.9

## 2017-09-30 NOTE — ED Notes (Signed)
Bed: WA03 Expected date:  Expected time:  Means of arrival:  Comments: EMS/Fall/knee injury

## 2017-09-30 NOTE — ED Notes (Signed)
Patient transported to X-ray 

## 2017-09-30 NOTE — ED Provider Notes (Signed)
[facility]-EMERGENCY DEPT Provider Note  CSN: [id number] Arrival date & time: 09/30/17 1212  Chief Complaint(s) Knee Injury and Fall  HPI Veronica [name] is a 56 y.o. female   The history is provided by the patient.  Knee Pain   This is a new problem. The current episode started less than 1 hour ago. The problem occurs constantly. The problem has not changed since onset.The pain is present in the left knee. The quality of the pain is described as pounding and constant. The pain is severe. Associated symptoms include limited range of motion. Treatments tried: fentanyl by EMS. The treatment provided mild relief. There has been a history of trauma (tripped on cement causing her to fall onto her left knee).   Denies any other injuries.   Past Medical History Past Medical History:  Diagnosis Date  . Cough   . Diabetes mellitus   . H/O diverticulitis of colon   . Heart attack (HCC) 08/2009  . Neuropathy   . OSA (obstructive sleep apnea)   . Sciatica    Patient Active Problem List   Diagnosis Date Noted  . H/O diverticulitis of colon   . Lumbar degenerative disc disease 02/02/2017  . Fibromyalgia syndrome 02/02/2017  . Hyponatremia 11/14/2016  . Research subject 11/14/2016  . Hypoxia 11/13/2016  . Respiratory failure with hypoxia (HCC) 11/13/2016  . SIRS (systemic inflammatory response syndrome) (HCC) 11/13/2016  . Influenza A 11/13/2016  . Anxiety and depression 11/13/2016  . Thyroid nodule 11/13/2016  . Diabetic polyneuropathy associated with type 2 diabetes mellitus (HCC) 08/15/2016  . Chronic midline low back pain 08/15/2016  . Lumbar facet arthropathy 08/15/2016  . Cough 08/15/2015  . OSA (obstructive sleep apnea) 08/15/2015  . Sciatica 08/15/2015  . Diabetes mellitus (HCC) 08/15/2015  . Hiatal hernia 08/15/2015  . GERD (gastroesophageal reflux disease) 08/15/2015   Home Medication(s) Prior to Admission medications   Medication Sig Start Date  End Date Taking? Authorizing Provider  acetaminophen (TYLENOL) 500 MG tablet Take 2 tablets (1,000 mg total) by mouth every 8 (eight) hours for 5 days. Do not take more than 4000 mg of acetaminophen (Tylenol) in a 24-hour period. Please note that other medicines that you may be prescribed may have Tylenol as well. 09/30/17 10/05/17  Nira Conn, MD  acetaminophen-codeine (TYLENOL #4) 300-60 MG tablet Take 1 tablet by mouth 2 (two) times daily as needed for moderate pain or severe pain. 04/17/17   Kirsteins, Victorino Sparrow, MD  citalopram (CELEXA) 40 MG tablet Take 40 mg by mouth daily.  10/01/16   [provider]  clonazePAM (KLONOPIN) 1 MG tablet Take 1 mg by mouth 2 (two) times daily as needed for anxiety (sleep).  10/01/16   [provider]  gabapentin (NEURONTIN) 400 MG capsule Take 1 capsule (400 mg total) by mouth 4 (four) times daily. 04/17/17   Kirsteins, Victorino Sparrow, MD  insulin NPH Human (HUMULIN N,NOVOLIN N) 100 UNIT/ML injection Inject into the skin. 11/27/16   [provider]  insulin regular (NOVOLIN R,HUMULIN R) 250 units/2.37mL (100 units/mL) injection Please follow sliding scale. 11/27/16   [provider]  ondansetron (ZOFRAN-ODT) 8 MG disintegrating tablet Take 1 tablet (8 mg total) by mouth every 8 (eight) hours as needed for nausea. 07/24/15   Elvina Sidle, MD  oxyCODONE (ROXICODONE) 5 MG immediate release tablet Take 1 tablet (5 mg total) by mouth every 6 (six) hours as needed for up to 3 days for severe pain. 09/30/17 10/03/17  Nira Connardama, Pedro Eduardo, MD  traMADol (ULTRAM) 50 MG tablet Take 2 tablets (100 mg total) by mouth 2 (two) times daily. 12/05/16   Kirsteins, Victorino SparrowAndrew E, MD                                                                                                                                    Past Surgical History Past Surgical History:  Procedure Laterality Date  . NASAL RECONSTRUCTION    . PARTIAL COLECTOMY    . TONSILLECTOMY AND  ADENOIDECTOMY    . UVULOPALATOPHARYNGOPLASTY     Family History Family History  Problem Relation Age of Onset  . Diabetes Mother        type 2  . Diabetes Unknown        cousin type 1  . Diabetes Brother   . Congestive Heart Failure Brother   . Emphysema Paternal Grandfather   . Tuberculosis Paternal Grandfather     Social History Social History   Tobacco Use  . Smoking status: Former Smoker    Years: 4.00    Last attempt to quit: 10/28/1987    Years since quitting: 29.9  . Smokeless tobacco: Never Used  . Tobacco comment: 1 pack per 2 months  Substance Use Topics  . Alcohol use: No    Alcohol/week: 0.0 oz  . Drug use: No   Allergies Patient has no known allergies.  Review of Systems Review of Systems All other systems are reviewed and are negative for acute change except as noted in the HPI  Physical Exam Vital Signs  I have reviewed the triage vital signs BP 129/60 (BP Location: Right Arm)   Pulse 69   Temp 98 F (36.7 C) (Oral)   Resp 20   SpO2 100%   Physical Exam  Constitutional: She is oriented to person, place, and time. She appears well-developed and well-nourished. No distress.  HENT:  Head: Normocephalic and atraumatic.  Right Ear: External ear normal.  Left Ear: External ear normal.  Nose: Nose normal.  Eyes: Conjunctivae and EOM are normal. No scleral icterus.  Neck: Normal range of motion and phonation normal.  Cardiovascular: Normal rate and regular rhythm.  Pulmonary/Chest: Effort normal. No stridor. No respiratory distress.  Abdominal: She exhibits no distension.  Musculoskeletal: She exhibits no edema.       Left knee: She exhibits decreased range of motion, swelling, ecchymosis and bony tenderness. Tenderness found. Medial joint line and lateral joint line tenderness noted.       Legs: Neurological: She is alert and oriented to person, place, and time.  Skin: She is not diaphoretic.  Psychiatric: She has a normal mood and affect. Her  behavior is normal.  Vitals reviewed.   ED Results and Treatments Labs (all labs ordered are listed, but only abnormal results are displayed) Labs Reviewed - No data to display  EKG  EKG Interpretation  Date/Time:    Ventricular Rate:    PR Interval:    QRS Duration:   QT Interval:    QTC Calculation:   R Axis:     Text Interpretation:        Radiology Dg Knee 1-2 Views Left  Result Date: 09/30/2017 CLINICAL DATA:  Status post fall.  Knee pain. EXAM: LEFT KNEE - 1-2 VIEW COMPARISON:  None. FINDINGS: Severely comminuted fracture of the mid and inferior pole of the patella with 16 mm of distraction between the major fracture fragments and 90 degrees of rotation of the inferior major fracture fragment. No other fracture or dislocation. IMPRESSION: 1. Severely comminuted fracture of the mid and inferior pole of the patella with 16 mm of distraction between the major fracture fragments and 90 degrees of rotation of the inferior major fracture fragment. Electronically Signed   By: Elige Ko   On: 09/30/2017 13:18   Pertinent labs & imaging results that were available during my care of the patient were reviewed by me and considered in my medical decision making (see chart for details).  Medications Ordered in ED Medications  bacitracin 500 UNIT/GM ointment (not administered)  HYDROmorphone (DILAUDID) injection 1 mg (1 mg Intravenous Given 09/30/17 1245)  HYDROmorphone (DILAUDID) injection 1 mg (1 mg Intravenous Given 09/30/17 1344)  acetaminophen (TYLENOL) tablet 1,000 mg (1,000 mg Oral Given 09/30/17 1447)  oxyCODONE (Oxy IR/ROXICODONE) immediate release tablet 5 mg (5 mg Oral Given 09/30/17 1447)                                                                                                                                    Procedures Procedures  (including critical  care time)  Medical Decision Making / ED Course I have reviewed the nursing notes for this encounter and the patient's prior records (if available in EHR or on provided paperwork).    Workup consistent with left patella fracture with significant displacement at 18 mm.  Superficial abrasions without evidence of deep wound that would be concerning for open fracture.  Patient treated with IV pain medicine and oral pain medicine.  Discussed with [facility] orthopedic, who will follow closely with the patient in clinic and schedule likely surgery in 1 week.  Placed in a knee immobilizer and provided with crutches.  Final Clinical Impression(s) / ED Diagnoses Final diagnoses:  Closed displaced comminuted fracture of left patella, initial encounter   Disposition: Discharge  Condition: Good  I have discussed the results, Dx and Tx plan with the patient who expressed understanding and agree(s) with the plan. Discharge instructions discussed at great length. The patient was given strict return precautions who verbalized understanding of the instructions. No further questions at time of discharge.    ED Discharge Orders        Ordered    acetaminophen (TYLENOL) 500 MG tablet  Every 8 hours  09/30/17 1456    oxyCODONE (ROXICODONE) 5 MG immediate release tablet  Every 6 hours PRN     09/30/17 1456       Follow Up: Adonis Brooka, [facility] [address] [place] [id number] [phone]  Schedule an appointment as soon as possible for a visit  in 1-2 days  Sheral ApleyMurphy, Timothy D, MD [address] [place] [id number] [phone]  Schedule an appointment as soon as possible for a visit  in 1-2 days     This chart was dictated using voice recognition software.  Despite best efforts to proofread,  errors can occur which can change the documentation meaning.   Nira Connardama, Pedro Eduardo, MD 09/30/17 [id number]

## 2017-09-30 NOTE — ED Triage Notes (Signed)
Per [facility] pt had a fall in a parking lot tripped over a cement curb. Pt c/o left knee pain, swelling and abrasion noted. Did not hit head or any LOC. Pt also has chronic lumbar pain back that is bothering her after fall. Given 200 mcg fentanyl en route with no relief.

## 2017-10-07 ENCOUNTER — Other Ambulatory Visit: Payer: Self-pay

## 2017-10-07 ENCOUNTER — Encounter (HOSPITAL_COMMUNITY): Payer: Self-pay | Admitting: *Deleted

## 2017-10-07 NOTE — Progress Notes (Signed)
Spoke with pt for pre-op call. Pt's hx showed that she had had a MI in 2010, but pt states she had all the tests done and it all clean. She states her blood sugar was extremely high at the time and they thought the chest pain was due to her high blood sugar. Pt states she is type 2 diabetic. She tells me she has not been able to afford her Insulin since May due to her insurance not paying. She finally was able to get it filled yesterday after insurance was re-instated. Pt states she has been checking her blood sugar fasting and non fasting. States fasting blood sugar has been between 175-200. Non fasting has gotten up to 435. She states he last A1C was "6.something", but she knows it's got to be higher now. Pt instructed to take 1/2 of her Novolin N Insulin Thursday at dinner (will take 15 units). Instructed her to check her blood sugar when she gets up Friday AM. If blood sugar is over 70, take 1/2 dose of Novolin N (again 15 units). Instructed her to check her blood sugar every 2 hours until she leaves for the hospital. If blood sugar is >220 take 1/2 of usual correction dose of Novolin R insulin. If blood sugar is 70 or below, treat with 1/2 cup of clear juice (apple or cranberry) and recheck blood sugar 15 minutes after drinking juice. If blood sugar continues to be 70 or below, call the Short Stay department and ask to speak to a nurse. Pt and husband voiced understanding.

## 2017-10-08 ENCOUNTER — Encounter (HOSPITAL_COMMUNITY): Payer: Self-pay | Admitting: Emergency Medicine

## 2017-10-08 NOTE — Progress Notes (Signed)
Anesthesia Chart Review:  Pt is a same day work up.   Pt is a 56 year old female scheduled for ORIF patella vs patella tendon repair on 10/09/2017 with Duwayne HeckJason Rogers, MD  - PCP is Charlies SilversJennifer Couillard, PA (notes in care everywhere)   PMH includes:  CAD (mild nonobstructive by 2010 cath), DM, OSA, GERD. Former smoker.   Medications include: HCTZ, Humulin N, Humulin R  Labs will be obtained day of surgery.   CXR 03/31/17: No acute abnormality.  EKG 03/31/17: sinus rhythm  Cardiac cath 09/06/09:  - LM patent - LAD patent. D1 very, very small. D2 small and patent. Ramus has 30-40% ostial stenosis which is more apparent in LAO caudal view.  - CX 20-25% mid stenosis then it tapers down in AV groove. OM1 moderate sized and patent. OM2 - OM4 very, very small and diffusely diseased - RCA small, codominant, patent. PDA and PLV very small and patent.   Echo 09/04/09:  1. LV cavity size normal. Systolic function normal. Estimated EF 60-65%. Wall motion normal; no regional wall motion abnormalities. 2. Trivial pericardial effusion identified. Features are consistent with tamponade physiology  If labs acceptable day of surgery, I anticipate pt can proceed with surgery as scheduled.   Rica Mastngela Janely Gullickson, FNP-BC [facility]Anest[facility]iology Phone: [phone] 10/08/2017 2:15 PM

## 2017-10-09 ENCOUNTER — Ambulatory Visit (HOSPITAL_COMMUNITY): Payer: BLUE CROSS/BLUE SHIELD | Admitting: Emergency Medicine

## 2017-10-09 ENCOUNTER — Encounter (HOSPITAL_COMMUNITY)
Admission: RE | Disposition: A | Discharge status: Home / Self Care | Payer: Self-pay | Source: Ambulatory Visit | Attending: Orthopedic Surgery

## 2017-10-09 ENCOUNTER — Encounter (HOSPITAL_COMMUNITY): Payer: Self-pay

## 2017-10-09 ENCOUNTER — Observation Stay (HOSPITAL_COMMUNITY): Payer: BLUE CROSS/BLUE SHIELD

## 2017-10-09 ENCOUNTER — Other Ambulatory Visit: Payer: Self-pay

## 2017-10-09 ENCOUNTER — Observation Stay (HOSPITAL_COMMUNITY)
Admission: RE | Admit: 2017-10-09 | Discharge: 2017-10-14 | Disposition: A | Discharge status: Home / Self Care | Payer: BLUE CROSS/BLUE SHIELD | Source: Ambulatory Visit | Attending: Orthopedic Surgery | Admitting: Orthopedic Surgery

## 2017-10-09 DIAGNOSIS — Z794 Long term (current) use of insulin: Secondary | ICD-10-CM | POA: Insufficient documentation

## 2017-10-09 DIAGNOSIS — I2510 Atherosclerotic heart disease of native coronary artery without angina pectoris: Secondary | ICD-10-CM | POA: Insufficient documentation

## 2017-10-09 DIAGNOSIS — Z23 Encounter for immunization: Secondary | ICD-10-CM | POA: Diagnosis not present

## 2017-10-09 DIAGNOSIS — W19XXXA Unspecified fall, initial encounter: Secondary | ICD-10-CM | POA: Diagnosis not present

## 2017-10-09 DIAGNOSIS — K219 Gastro-esophageal reflux disease without esophagitis: Secondary | ICD-10-CM | POA: Insufficient documentation

## 2017-10-09 DIAGNOSIS — Y92009 Unspecified place in unspecified non-institutional (private) residence as the place of occurrence of the external cause: Secondary | ICD-10-CM | POA: Insufficient documentation

## 2017-10-09 DIAGNOSIS — Z79899 Other long term (current) drug therapy: Secondary | ICD-10-CM | POA: Diagnosis not present

## 2017-10-09 DIAGNOSIS — Z7982 Long term (current) use of aspirin: Secondary | ICD-10-CM | POA: Diagnosis not present

## 2017-10-09 DIAGNOSIS — Z87891 Personal history of nicotine dependence: Secondary | ICD-10-CM | POA: Diagnosis not present

## 2017-10-09 DIAGNOSIS — S76192A Other specified injury of left quadriceps muscle, fascia and tendon, initial encounter: Secondary | ICD-10-CM | POA: Diagnosis not present

## 2017-10-09 DIAGNOSIS — F329 Major depressive disorder, single episode, unspecified: Secondary | ICD-10-CM | POA: Insufficient documentation

## 2017-10-09 DIAGNOSIS — E1140 Type 2 diabetes mellitus with diabetic neuropathy, unspecified: Secondary | ICD-10-CM | POA: Insufficient documentation

## 2017-10-09 DIAGNOSIS — G4733 Obstructive sleep apnea (adult) (pediatric): Secondary | ICD-10-CM | POA: Insufficient documentation

## 2017-10-09 DIAGNOSIS — F419 Anxiety disorder, unspecified: Secondary | ICD-10-CM | POA: Insufficient documentation

## 2017-10-09 DIAGNOSIS — I252 Old myocardial infarction: Secondary | ICD-10-CM | POA: Diagnosis not present

## 2017-10-09 DIAGNOSIS — S82042A Displaced comminuted fracture of left patella, initial encounter for closed fracture: Secondary | ICD-10-CM | POA: Diagnosis not present

## 2017-10-09 HISTORY — PX: PATELLAR TENDON REPAIR: SHX737

## 2017-10-09 HISTORY — DX: Atherosclerotic heart disease of native coronary artery without angina pectoris: I25.10

## 2017-10-09 HISTORY — DX: Unspecified osteoarthritis, unspecified site: M19.90

## 2017-10-09 HISTORY — DX: Gastro-esophageal reflux disease without esophagitis: K21.9

## 2017-10-09 HISTORY — PX: ORIF PATELLA: SHX5033

## 2017-10-09 HISTORY — DX: Pneumonia, unspecified organism: J18.9

## 2017-10-09 HISTORY — DX: Anxiety disorder, unspecified: F41.9

## 2017-10-09 HISTORY — DX: Depression, unspecified: F32.A

## 2017-10-09 HISTORY — DX: Major depressive disorder, single episode, unspecified: F32.9

## 2017-10-09 LAB — CBC
HEMATOCRIT: 36.4 % (ref 36.0–46.0)
HEMATOCRIT: 36.4 % (ref 36.0–46.0)
HEMOGLOBIN: 12 g/dL (ref 12.0–15.0)
Hemoglobin: 11.9 g/dL — ABNORMAL LOW (ref 12.0–15.0)
MCH: 29.9 pg (ref 26.0–34.0)
MCH: 29.9 pg (ref 26.0–34.0)
MCHC: 33 g/dL (ref 30.0–36.0)
MCHC: 32.7 g/dL (ref 30.0–36.0)
MCV: 90.5 fL (ref 78.0–100.0)
MCV: 91.5 fL (ref 78.0–100.0)
Platelets: 287 10*3/uL (ref 150–400)
Platelets: 274 10*3/uL (ref 150–400)
RBC: 4.02 MIL/uL (ref 3.87–5.11)
RBC: 3.98 MIL/uL (ref 3.87–5.11)
RDW: 12.9 % (ref 11.5–15.5)
RDW: 12.8 % (ref 11.5–15.5)
WBC: 7.2 10*3/uL (ref 4.0–10.5)
WBC: 10.9 10*3/uL — ABNORMAL HIGH (ref 4.0–10.5)

## 2017-10-09 LAB — BASIC METABOLIC PANEL
ANION GAP: 8 (ref 5–15)
BUN: 7 mg/dL (ref 6–20)
CHLORIDE: 103 mmol/L (ref 101–111)
CO2: 26 mmol/L (ref 22–32)
Calcium: 8.8 mg/dL — ABNORMAL LOW (ref 8.9–10.3)
Creatinine, Ser: 0.75 mg/dL (ref 0.44–1.00)
GFR calc non Af Amer: >60 mL/min (ref >60)
Glucose, Bld: 108 mg/dL — ABNORMAL HIGH (ref 65–99)
Potassium: 4.2 mmol/L (ref 3.5–5.1)
Sodium: 137 mmol/L (ref 135–145)

## 2017-10-09 LAB — GLUCOSE, CAPILLARY
GLUCOSE-CAPILLARY: 171 mg/dL — AB (ref 65–99)
Glucose-Capillary: 118 mg/dL — ABNORMAL HIGH (ref 65–99)
Glucose-Capillary: 98 mg/dL (ref 65–99)
Glucose-Capillary: 123 mg/dL — ABNORMAL HIGH (ref 65–99)

## 2017-10-09 LAB — CREATININE, SERUM
Creatinine, Ser: 0.73 mg/dL (ref 0.44–1.00)
GFR calc Af Amer: >60 mL/min (ref >60)
GFR calc non Af Amer: >60 mL/min (ref >60)

## 2017-10-09 SURGERY — OPEN REDUCTION INTERNAL FIXATION (ORIF) PATELLA
Anesthesia: Regional | Laterality: Left

## 2017-10-09 MED ORDER — ONDANSETRON HCL 4 MG/2ML IJ SOLN: 4.0000 mg | Freq: Four times a day (QID) | INTRAMUSCULAR | Status: DC | PRN

## 2017-10-09 MED ORDER — PHENYLEPHRINE 40 MCG/ML (10ML) SYRINGE FOR IV PUSH (FOR BLOOD PRESSURE SUPPORT)
PREFILLED_SYRINGE | INTRAVENOUS | Status: AC
  Filled 2017-10-09: qty 10

## 2017-10-09 MED ORDER — LACTATED RINGERS IV SOLN
INTRAVENOUS | Status: DC
  Administered 2017-10-09 – 2017-10-11 (×3): via INTRAVENOUS

## 2017-10-09 MED ORDER — HYDROMORPHONE HCL 1 MG/ML IJ SOLN
INTRAMUSCULAR | Status: AC
  Administered 2017-10-09: 0.5 mg via INTRAVENOUS
  Filled 2017-10-09: qty 1

## 2017-10-09 MED ORDER — INSULIN ASPART 100 UNIT/ML ~~LOC~~ SOLN
0.0000 [IU] | Freq: Every day | SUBCUTANEOUS | Status: DC
  Administered 2017-10-10: 2 [IU] via SUBCUTANEOUS
  Administered 2017-10-11: 3 [IU] via SUBCUTANEOUS
  Administered 2017-10-14: 2 [IU] via SUBCUTANEOUS

## 2017-10-09 MED ORDER — ONDANSETRON HCL 4 MG/2ML IJ SOLN
4.0000 mg | Freq: Four times a day (QID) | INTRAMUSCULAR | Status: DC | PRN
  Administered 2017-10-12 – 2017-10-13 (×2): 4 mg via INTRAVENOUS
  Filled 2017-10-09 (×2): qty 2

## 2017-10-09 MED ORDER — DEXAMETHASONE SODIUM PHOSPHATE 10 MG/ML IJ SOLN
INTRAMUSCULAR | Status: AC
  Filled 2017-10-09: qty 1

## 2017-10-09 MED ORDER — PROPOFOL 10 MG/ML IV BOLUS
INTRAVENOUS | Status: AC
  Filled 2017-10-09: qty 20

## 2017-10-09 MED ORDER — FENTANYL CITRATE (PF) 100 MCG/2ML IJ SOLN
100.0000 ug | Freq: Once | INTRAMUSCULAR | Status: AC
  Administered 2017-10-09: 100 ug via INTRAVENOUS

## 2017-10-09 MED ORDER — HYDROMORPHONE HCL 1 MG/ML IJ SOLN
0.2500 mg | INTRAMUSCULAR | Status: DC | PRN
  Administered 2017-10-09 (×4): 0.5 mg via INTRAVENOUS

## 2017-10-09 MED ORDER — PHENYLEPHRINE HCL 10 MG/ML IJ SOLN
INTRAMUSCULAR | Status: DC | PRN
  Administered 2017-10-09: 120 ug via INTRAVENOUS
  Administered 2017-10-09 (×2): 80 ug via INTRAVENOUS

## 2017-10-09 MED ORDER — INSULIN ASPART 100 UNIT/ML ~~LOC~~ SOLN
0.0000 [IU] | Freq: Three times a day (TID) | SUBCUTANEOUS | Status: DC
  Administered 2017-10-10: 5 [IU] via SUBCUTANEOUS
  Administered 2017-10-10: 8 [IU] via SUBCUTANEOUS
  Administered 2017-10-10 – 2017-10-11 (×2): 5 [IU] via SUBCUTANEOUS
  Administered 2017-10-11: 2 [IU] via SUBCUTANEOUS
  Administered 2017-10-11: 5 [IU] via SUBCUTANEOUS
  Administered 2017-10-12: 3 [IU] via SUBCUTANEOUS
  Administered 2017-10-12: 2 [IU] via SUBCUTANEOUS
  Administered 2017-10-12: 8 [IU] via SUBCUTANEOUS
  Administered 2017-10-13 (×2): 3 [IU] via SUBCUTANEOUS
  Administered 2017-10-14: 2 [IU] via SUBCUTANEOUS

## 2017-10-09 MED ORDER — PROPOFOL 10 MG/ML IV BOLUS
INTRAVENOUS | Status: DC | PRN
  Administered 2017-10-09: 200 mg via INTRAVENOUS

## 2017-10-09 MED ORDER — MIDAZOLAM HCL 5 MG/5ML IJ SOLN
INTRAMUSCULAR | Status: DC | PRN
  Administered 2017-10-09: 2 mg via INTRAVENOUS

## 2017-10-09 MED ORDER — OXYCODONE HCL 5 MG/5ML PO SOLN: 5.0000 mg | Freq: Once | ORAL | Status: DC | PRN

## 2017-10-09 MED ORDER — MORPHINE SULFATE (PF) 2 MG/ML IV SOLN
2.0000 mg | INTRAVENOUS | Status: DC | PRN
  Administered 2017-10-09 – 2017-10-12 (×10): 2 mg via INTRAVENOUS
  Filled 2017-10-09 (×10): qty 1

## 2017-10-09 MED ORDER — CLONAZEPAM 1 MG PO TABS: 1.0000 mg | ORAL_TABLET | Freq: Two times a day (BID) | ORAL | Status: DC | PRN

## 2017-10-09 MED ORDER — FENTANYL CITRATE (PF) 250 MCG/5ML IJ SOLN
INTRAMUSCULAR | Status: AC
  Filled 2017-10-09: qty 5

## 2017-10-09 MED ORDER — FLUOXETINE HCL 20 MG PO CAPS
20.0000 mg | ORAL_CAPSULE | Freq: Every day | ORAL | Status: DC
Start: 2017-10-09 — End: 2017-10-14
  Administered 2017-10-09 – 2017-10-14 (×6): 20 mg via ORAL
  Filled 2017-10-09 (×6): qty 1

## 2017-10-09 MED ORDER — MIDAZOLAM HCL 2 MG/2ML IJ SOLN
INTRAMUSCULAR | Status: AC
  Filled 2017-10-09: qty 2

## 2017-10-09 MED ORDER — LIDOCAINE HCL (CARDIAC) 20 MG/ML IV SOLN
INTRAVENOUS | Status: DC | PRN
  Administered 2017-10-09: 100 mg via INTRAVENOUS

## 2017-10-09 MED ORDER — MIDAZOLAM HCL 2 MG/2ML IJ SOLN
INTRAMUSCULAR | Status: AC
  Administered 2017-10-09: 2 mg via INTRAVENOUS
  Filled 2017-10-09: qty 2

## 2017-10-09 MED ORDER — DEXAMETHASONE SODIUM PHOSPHATE 10 MG/ML IJ SOLN
INTRAMUSCULAR | Status: DC | PRN
  Administered 2017-10-09: 4 mg via INTRAVENOUS

## 2017-10-09 MED ORDER — FENTANYL CITRATE (PF) 100 MCG/2ML IJ SOLN
INTRAMUSCULAR | Status: DC | PRN
  Administered 2017-10-09: 50 ug via INTRAVENOUS
  Administered 2017-10-09: 100 ug via INTRAVENOUS
  Administered 2017-10-09 (×2): 50 ug via INTRAVENOUS

## 2017-10-09 MED ORDER — ONDANSETRON HCL 4 MG/2ML IJ SOLN
INTRAMUSCULAR | Status: AC
  Filled 2017-10-09: qty 2

## 2017-10-09 MED ORDER — SUGAMMADEX SODIUM 200 MG/2ML IV SOLN
INTRAVENOUS | Status: AC
  Filled 2017-10-09: qty 2

## 2017-10-09 MED ORDER — METHOCARBAMOL 500 MG PO TABS
500.0000 mg | ORAL_TABLET | Freq: Four times a day (QID) | ORAL | Status: DC | PRN
  Administered 2017-10-09 – 2017-10-14 (×16): 500 mg via ORAL
  Filled 2017-10-09 (×15): qty 1

## 2017-10-09 MED ORDER — OXYCODONE HCL 5 MG PO TABS
ORAL_TABLET | ORAL | Status: AC
  Administered 2017-10-09: 10 mg via ORAL
  Filled 2017-10-09: qty 2

## 2017-10-09 MED ORDER — SUGAMMADEX SODIUM 500 MG/5ML IV SOLN
INTRAVENOUS | Status: DC | PRN
  Administered 2017-10-09: 400 mg via INTRAVENOUS

## 2017-10-09 MED ORDER — BUPIVACAINE-EPINEPHRINE (PF) 0.5% -1:200000 IJ SOLN
INTRAMUSCULAR | Status: DC | PRN
  Administered 2017-10-09: 30 mL via PERINEURAL

## 2017-10-09 MED ORDER — ASPIRIN EC 325 MG PO TBEC: 325.0000 mg | DELAYED_RELEASE_TABLET | Freq: Two times a day (BID) | ORAL | 0 refills | Status: AC

## 2017-10-09 MED ORDER — CEFAZOLIN SODIUM-DEXTROSE 2-4 GM/100ML-% IV SOLN
2.0000 g | INTRAVENOUS | Status: AC
  Administered 2017-10-09: 2 g via INTRAVENOUS
  Filled 2017-10-09: qty 100

## 2017-10-09 MED ORDER — GABAPENTIN 400 MG PO CAPS
400.0000 mg | ORAL_CAPSULE | Freq: Four times a day (QID) | ORAL | Status: DC
  Administered 2017-10-09 – 2017-10-14 (×18): 400 mg via ORAL
  Filled 2017-10-09 (×19): qty 1

## 2017-10-09 MED ORDER — ROCURONIUM BROMIDE 10 MG/ML (PF) SYRINGE
PREFILLED_SYRINGE | INTRAVENOUS | Status: AC
  Filled 2017-10-09: qty 5

## 2017-10-09 MED ORDER — OXYCODONE HCL 5 MG PO TABS: 5.0000 mg | ORAL_TABLET | Freq: Once | ORAL | Status: DC | PRN

## 2017-10-09 MED ORDER — ONDANSETRON HCL 4 MG/2ML IJ SOLN
INTRAMUSCULAR | Status: DC | PRN
  Administered 2017-10-09: 4 mg via INTRAVENOUS

## 2017-10-09 MED ORDER — CHLORHEXIDINE GLUCONATE 4 % EX LIQD: 60.0000 mL | Freq: Once | CUTANEOUS | Status: DC

## 2017-10-09 MED ORDER — FENTANYL CITRATE (PF) 100 MCG/2ML IJ SOLN
INTRAMUSCULAR | Status: AC
  Administered 2017-10-09: 100 ug via INTRAVENOUS
  Filled 2017-10-09: qty 2

## 2017-10-09 MED ORDER — HYDROMORPHONE HCL 1 MG/ML IJ SOLN
0.5000 mg | INTRAMUSCULAR | Status: AC | PRN
  Administered 2017-10-09 (×2): 0.5 mg via INTRAVENOUS

## 2017-10-09 MED ORDER — LIDOCAINE 2% (20 MG/ML) 5 ML SYRINGE
INTRAMUSCULAR | Status: AC
  Filled 2017-10-09: qty 5

## 2017-10-09 MED ORDER — HYDROCHLOROTHIAZIDE 12.5 MG PO CAPS
12.5000 mg | ORAL_CAPSULE | Freq: Every day | ORAL | Status: DC
  Administered 2017-10-10 – 2017-10-14 (×5): 12.5 mg via ORAL
  Filled 2017-10-09 (×5): qty 1

## 2017-10-09 MED ORDER — DOCUSATE SODIUM 100 MG PO CAPS
100.0000 mg | ORAL_CAPSULE | Freq: Two times a day (BID) | ORAL | Status: DC
  Administered 2017-10-09 – 2017-10-14 (×10): 100 mg via ORAL
  Filled 2017-10-09 (×10): qty 1

## 2017-10-09 MED ORDER — ONDANSETRON HCL 4 MG PO TABS: 4.0000 mg | ORAL_TABLET | Freq: Four times a day (QID) | ORAL | Status: DC | PRN

## 2017-10-09 MED ORDER — METHOCARBAMOL 500 MG PO TABS
ORAL_TABLET | ORAL | Status: AC
  Administered 2017-10-09: 500 mg via ORAL
  Filled 2017-10-09: qty 1

## 2017-10-09 MED ORDER — MIDAZOLAM HCL 2 MG/2ML IJ SOLN
2.0000 mg | Freq: Once | INTRAMUSCULAR | Status: AC
  Administered 2017-10-09: 2 mg via INTRAVENOUS

## 2017-10-09 MED ORDER — ROCURONIUM BROMIDE 100 MG/10ML IV SOLN
INTRAVENOUS | Status: DC | PRN
  Administered 2017-10-09: 50 mg via INTRAVENOUS

## 2017-10-09 MED ORDER — PHENYLEPHRINE HCL 10 MG/ML IJ SOLN
INTRAVENOUS | Status: DC | PRN
  Administered 2017-10-09: 60 ug/min via INTRAVENOUS

## 2017-10-09 MED ORDER — CELECOXIB 200 MG PO CAPS
200.0000 mg | ORAL_CAPSULE | Freq: Two times a day (BID) | ORAL | Status: DC
  Administered 2017-10-09 – 2017-10-14 (×10): 200 mg via ORAL
  Filled 2017-10-09 (×10): qty 1

## 2017-10-09 MED ORDER — ACETAMINOPHEN 500 MG PO TABS
1000.0000 mg | ORAL_TABLET | Freq: Four times a day (QID) | ORAL | Status: DC
  Administered 2017-10-09 – 2017-10-14 (×17): 1000 mg via ORAL
  Filled 2017-10-09 (×19): qty 2

## 2017-10-09 MED ORDER — ENOXAPARIN SODIUM 40 MG/0.4ML ~~LOC~~ SOLN
40.0000 mg | SUBCUTANEOUS | Status: DC
  Administered 2017-10-10 – 2017-10-14 (×5): 40 mg via SUBCUTANEOUS
  Filled 2017-10-09 (×6): qty 0.4

## 2017-10-09 MED ORDER — 0.9 % SODIUM CHLORIDE (POUR BTL) OPTIME
TOPICAL | Status: DC | PRN
  Administered 2017-10-09: 1000 mL

## 2017-10-09 MED ORDER — OXYCODONE HCL 5 MG PO TABS
5.0000 mg | ORAL_TABLET | ORAL | Status: DC | PRN
  Administered 2017-10-09 – 2017-10-14 (×23): 10 mg via ORAL
  Filled 2017-10-09 (×24): qty 2

## 2017-10-09 MED ORDER — OXYCODONE-ACETAMINOPHEN 7.5-325 MG PO TABS: 1.0000 | ORAL_TABLET | Freq: Four times a day (QID) | ORAL | 0 refills | Status: AC | PRN

## 2017-10-09 MED ORDER — METHOCARBAMOL 1000 MG/10ML IJ SOLN
500.0000 mg | Freq: Four times a day (QID) | INTRAVENOUS | Status: DC | PRN
  Filled 2017-10-09: qty 5

## 2017-10-09 SURGICAL SUPPLY — 60 items
ANCH SUT CRKSW FT 1.3X (Anchor) ×2 IMPLANT
ANCHOR SUT BIOCOMP CORKSREW (Anchor) ×4 IMPLANT
BANDAGE ACE 6X5 VEL STRL LF (GAUZE/BANDAGES/DRESSINGS) ×3 IMPLANT
BANDAGE ESMARK 6X9 LF (GAUZE/BANDAGES/DRESSINGS) ×1 IMPLANT
BLADE CLIPPER SURG (BLADE) IMPLANT
BNDG CMPR 9X6 STRL LF SNTH (GAUZE/BANDAGES/DRESSINGS) ×1
BNDG COHESIVE 6X5 TAN STRL LF (GAUZE/BANDAGES/DRESSINGS) ×3 IMPLANT
BNDG ESMARK 6X9 LF (GAUZE/BANDAGES/DRESSINGS) ×3
BNDG GAUZE ELAST 4 BULKY (GAUZE/BANDAGES/DRESSINGS) ×3 IMPLANT
COVER SURGICAL LIGHT HANDLE (MISCELLANEOUS) ×3 IMPLANT
DRAPE C-ARM 42X72 X-RAY (DRAPES) ×3 IMPLANT
DRAPE C-ARMOR (DRAPES) ×3 IMPLANT
DRAPE HALF SHEET 40X57 (DRAPES) ×3 IMPLANT
DRAPE IMP U-DRAPE 54X76 (DRAPES) ×6 IMPLANT
DRAPE INCISE IOBAN 66X45 STRL (DRAPES) ×3 IMPLANT
DRAPE ORTHO SPLIT 77X108 STRL (DRAPES) ×6
DRAPE SURG ORHT 6 SPLT 77X108 (DRAPES) ×2 IMPLANT
DRAPE U-SHAPE 47X51 STRL (DRAPES) ×3 IMPLANT
DURAPREP 26ML APPLICATOR (WOUND CARE) ×5 IMPLANT
ELECT REM PT RETURN 9FT ADLT (ELECTROSURGICAL) ×3
ELECTRODE REM PT RTRN 9FT ADLT (ELECTROSURGICAL) ×1 IMPLANT
FACESHIELD STD STERILE (MASK) IMPLANT
GAUZE XEROFORM 1X8 LF (GAUZE/BANDAGES/DRESSINGS) ×3 IMPLANT
GLOVE BIO SURGEON STRL SZ7.5 (GLOVE) ×3 IMPLANT
GLOVE BIOGEL PI IND STRL 8 (GLOVE) ×1 IMPLANT
GLOVE BIOGEL PI INDICATOR 8 (GLOVE) ×2
GOWN STRL REUS W/ TWL LRG LVL3 (GOWN DISPOSABLE) ×2 IMPLANT
GOWN STRL REUS W/ TWL XL LVL3 (GOWN DISPOSABLE) ×1 IMPLANT
GOWN STRL REUS W/TWL LRG LVL3 (GOWN DISPOSABLE) ×6
GOWN STRL REUS W/TWL XL LVL3 (GOWN DISPOSABLE) ×3
IMMOBILIZER KNEE 20 (SOFTGOODS) ×3
IMMOBILIZER KNEE 20 THIGH 36 (SOFTGOODS) ×1 IMPLANT
K-WIRE 2.0X200MM (WIRE) ×6
KIT BASIN OR (CUSTOM PROCEDURE TRAY) ×3 IMPLANT
KIT ROOM TURNOVER OR (KITS) ×3 IMPLANT
KWIRE 2.0X200MM (WIRE) ×2 IMPLANT
MANIFOLD NEPTUNE II (INSTRUMENTS) ×3 IMPLANT
NDL SUT 6 .5 CRC .975X.05 MAYO (NEEDLE) IMPLANT
NEEDLE MAYO TAPER (NEEDLE)
NS IRRIG 1000ML POUR BTL (IV SOLUTION) ×3 IMPLANT
PACK GENERAL/GYN (CUSTOM PROCEDURE TRAY) ×3 IMPLANT
PAD ABD 8X10 STRL (GAUZE/BANDAGES/DRESSINGS) ×3 IMPLANT
PAD ARMBOARD 7.5X6 YLW CONV (MISCELLANEOUS) ×3 IMPLANT
PAD CAST 4YDX4 CTTN HI CHSV (CAST SUPPLIES) ×1 IMPLANT
PADDING CAST COTTON 4X4 STRL (CAST SUPPLIES) ×3
PADDING CAST COTTON 6X4 STRL (CAST SUPPLIES) ×3 IMPLANT
SPONGE GAUZE 4X4 12PLY STER LF (GAUZE/BANDAGES/DRESSINGS) ×3 IMPLANT
STAPLER VISISTAT 35W (STAPLE) ×5 IMPLANT
STOCKINETTE IMPERVIOUS LG (DRAPES) ×2 IMPLANT
SUT ETHILON 2 0 FS 18 (SUTURE) IMPLANT
SUT ETHILON 3 0 PS 1 (SUTURE) IMPLANT
SUT FIBERWIRE #2 38 T-5 BLUE (SUTURE) ×6
SUT PDS AB 0 CT 36 (SUTURE) IMPLANT
SUT VIC AB 0 CT1 27 (SUTURE) ×3
SUT VIC AB 0 CT1 27XBRD ANBCTR (SUTURE) IMPLANT
SUT VIC AB 2-0 CT1 27 (SUTURE) ×6
SUT VIC AB 2-0 CT1 TAPERPNT 27 (SUTURE) IMPLANT
SUTURE FIBERWR #2 38 T-5 BLUE (SUTURE) ×2 IMPLANT
TOWEL OR 17X24 6PK STRL BLUE (TOWEL DISPOSABLE) ×3 IMPLANT
TOWEL OR 17X26 10 PK STRL BLUE (TOWEL DISPOSABLE) ×6 IMPLANT

## 2017-10-09 NOTE — Anesthesia Procedure Notes (Signed)
Procedure Name: Intubation Date/Time: 10/09/2017 3:01 PM Performed by: Orlie Dakin, CRNA Pre-anesthesia Checklist: Patient identified, Emergency Drugs available, Suction available, Patient being monitored and Timeout performed Patient Re-evaluated:Patient Re-evaluated prior to induction Oxygen Delivery Method: Circle system utilized Preoxygenation: Pre-oxygenation with 100% oxygen Induction Type: IV induction Ventilation: Mask ventilation without difficulty and Oral airway inserted - appropriate to patient size Laryngoscope Size: Mac and 4 Grade View: Grade II Tube type: Oral Tube size: 7.0 mm Number of attempts: 1 Airway Equipment and Method: Stylet Placement Confirmation: ETT inserted through vocal cords under direct vision,  breath sounds checked- equal and bilateral and positive ETCO2 Secured at: 24 cm Tube secured with: Tape Dental Injury: Teeth and Oropharynx as per pre-operative assessment  Comments: 4x4s bite block used.

## 2017-10-09 NOTE — Brief Op Note (Signed)
10/09/2017  4:29 PM  PATIENT:  Silvio PateMarion A Shatto  56 y.o. female  PRE-OPERATIVE DIAGNOSIS:  Left patella fracture  POST-OPERATIVE DIAGNOSIS:  Left patella fracture  PROCEDURE:  Procedure(s) with comments: Patella tendon advancement and repair  SURGEON:  Surgeon(s) and Role:    * Yolonda Kidaogers, Shadaya Marschner Patrick, MD - Primary  PHYSICIAN ASSISTANT:   ASSISTANTS: April Green, RNFA   ANESTHESIA:   regional and general  EBL:  40 mL  BLOOD ADMINISTERED:none  DRAINS: none   LOCAL MEDICATIONS USED:  NONE  SPECIMEN:  No Specimen  DISPOSITION OF SPECIMEN:  N/A  COUNTS:  YES  TOURNIQUET:   Total Tourniquet Time Documented: Thigh (Left) - 59 minutes Total: Thigh (Left) - 59 minutes   DICTATION: .Note written in EPIC  PLAN OF CARE: Admit to inpatient   PATIENT DISPOSITION:  PACU - hemodynamically stable.   Delay start of Pharmacological VTE agent (>24hrs) due to surgical blood loss or risk of bleeding: not applicable

## 2017-10-09 NOTE — Progress Notes (Signed)
Orthopedic Tech Progress Note Patient Details:  Silvio PateMarion A Pasquarella 05/12/1961 [id number]  Patient ID: Silvio PateMarion A Klahn, female   DOB: 11/29/1960, 56 y.o.   MRN: [id number] Called [employer] for brace order.  Jennye MoccasinHughes, Icholas Irby Craig 10/09/2017, 4:22 PM

## 2017-10-09 NOTE — Anesthesia Preprocedure Evaluation (Signed)
Anesthesia Evaluation  Patient identified by MRN, date of birth, ID band Patient awake    Reviewed: Allergy & Precautions, H&P , NPO status , Patient's Chart, lab work & pertinent test results  Airway Mallampati: II   Neck ROM: full    Dental   Pulmonary sleep apnea , former smoker,    breath sounds clear to auscultation       Cardiovascular + CAD   Rhythm:regular Rate:Normal  Non-obstructive CAD by cath.  Normal EF   Neuro/Psych Anxiety Depression  Neuromuscular disease    GI/Hepatic hiatal hernia, GERD  ,  Endo/Other  diabetes, Type obesity  Renal/GU      Musculoskeletal  (+) Arthritis ,   Abdominal   Peds  Hematology   Anesthesia Other Findings   Reproductive/Obstetrics                             Anesthesia Physical Anesthesia Plan  ASA: III  Anesthesia Plan: General   Post-op Pain Management:  Regional for Post-op pain   Induction: Intravenous  PONV Risk Score and Plan: 3 and Ondansetron, Dexamethasone, Midazolam and Treatment may vary due to age or medical condition  Airway Management Planned: Oral ETT  Additional Equipment:   Intra-op Plan:   Post-operative Plan: Extubation in OR  Informed Consent: I have reviewed the patients History and Physical, chart, labs and discussed the procedure including the risks, benefits and alternatives for the proposed anesthesia with the patient or authorized representative who has indicated his/her understanding and acceptance.     Plan Discussed with: CRNA, Anesthesiologist and Surgeon  Anesthesia Plan Comments:         Anesthesia Quick Evaluation

## 2017-10-09 NOTE — Transfer of Care (Signed)
Immediate Anesthesia Transfer of Care Note  Patient: Veronica [name]  Procedure(s) Performed: PATELLA TENDON REPAIR (Left )  Patient Location: PACU  Anesthesia Type:General and Regional  Level of Consciousness: awake and patient cooperative  Airway & Oxygen Therapy: Patient Spontanous Breathing and Patient connected to face mask oxygen  Post-op Assessment: Report given to RN and Post -op Vital signs reviewed and stable  Post vital signs: Reviewed and stable  Last Vitals:  Vitals:   10/09/17 1405 10/09/17 1631  BP: 138/75 (!) 119/92  Pulse: 88 88  Resp: (!) 24 14  Temp:  36.8 C  SpO2: 97% 98%    Last Pain:  Vitals:   10/09/17 1631  TempSrc:   PainSc: (P) 0-No pain      Patients Stated Pain Goal: 3 (10/09/17 1400)  Complications: No apparent anesthesia complications

## 2017-10-09 NOTE — H&P (Signed)
ORTHOPAEDIC H and P  REQUESTING PHYSICIAN: Yolonda Kidaogers, Jason Patrick, MD  PCP:  Charlies Silversouillard, Jennifer, PA-C  Chief Complaint: Left patella fracture  HPI: Veronica [name] is a 56 y.o. female who complains of left knee pain following a fall.  She was evaluated at the emergency department and found to have a left patella fracture with loss of the knee extension.  We discussed in the office moving forward with open reduction and internal fixation to reestablish her extensor mechanism of the left knee.  All risks, benefits and indications for the procedure were reviewed at length.  She did provide informed consent and is here today for that surgery.  Past Medical History:  Diagnosis Date  . Anxiety   . Arthritis   . Coronary artery disease    nonobstructive by 2010 cath  . Cough   . Depression   . Diabetes mellitus   . GERD (gastroesophageal reflux disease)    years ago  . H/O diverticulitis of colon   . Heart attack (HCC) 08/2009  . Neuropathy   . OSA (obstructive sleep apnea)    pt denies   . Pneumonia   . Sciatica    Past Surgical History:  Procedure Laterality Date  . COLONOSCOPY    . NASAL RECONSTRUCTION    . PARTIAL COLECTOMY    . TONSILLECTOMY AND ADENOIDECTOMY    . UVULOPALATOPHARYNGOPLASTY     Social History   Socioeconomic History  . Marital status: Married    Spouse name: None  . Number of children: None  . Years of education: None  . Highest education level: None  Social Needs  . Financial resource strain: None  . Food insecurity - worry: None  . Food insecurity - inability: None  . Transportation needs - medical: None  . Transportation needs - non-medical: None  Occupational History  . None  Tobacco Use  . Smoking status: Former Smoker    Years: 4.00    Last attempt to quit: 10/28/1987    Years since quitting: 29.9  . Smokeless tobacco: Never Used  . Tobacco comment: 1 pack per 2 months  Substance and Sexual Activity  . Alcohol use: No   Alcohol/week: 0.0 oz  . Drug use: No  . Sexual activity: None  Other Topics Concern  . None  Social History Narrative   Originally from [place]. Always lived in [place]. Prior travel to [place], [place], & [place]. 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 She has no pets currently. No bird or hot tub exposure. She reports that there has been mold in her basement before that was treated twice. She also has mold in her bathroom & bedroom.    Family History  Problem Relation Age of Onset  . Diabetes Mother        type 2  . Diabetes Unknown        cousin type 1  . Diabetes Brother   . Congestive Heart Failure Brother   . Emphysema Paternal Grandfather   . Tuberculosis Paternal Grandfather    No Known Allergies Prior to Admission medications   Medication Sig Start Date End Date Taking? Authorizing Provider  acetaminophen-codeine (TYLENOL #4) 300-60 MG tablet Take 1 tablet by mouth 2 (two) times daily as needed for moderate pain or severe pain. 04/17/17  Yes Kirsteins, Victorino SparrowAndrew E, MD  clonazePAM (KLONOPIN) 1 MG tablet Take 1 mg by mouth 2 (two) times daily as needed  for anxiety (sleep).  10/01/16  Yes [provider]  FLUoxetine (PROZAC) 10 MG capsule Take 20 mg by mouth daily.   Yes [provider]  gabapentin (NEURONTIN) 400 MG capsule Take 1 capsule (400 mg total) by mouth 4 (four) times daily. 04/17/17  Yes Kirsteins, Victorino SparrowAndrew E, MD  hydrochlorothiazide (HYDRODIURIL) 12.5 MG tablet Take 12.5 mg by mouth daily as needed.  04/10/17  Yes [provider]  HYDROcodone-acetaminophen (NORCO/VICODIN) 5-325 MG tablet Take 1 tablet by mouth every 4 (four) hours as needed for moderate pain.   Yes [provider]  insulin NPH Human (HUMULIN N,NOVOLIN N) 100 UNIT/ML injection Inject 30 Units into the skin 2 (two) times daily before a meal.  11/27/16  Yes [provider]  insulin regular (NOVOLIN R,HUMULIN R) 100  units/mL injection Inject 5-15 Units into the skin 3 (three) times daily before meals. Per sliding scales 04/12/17  Yes [provider]  ondansetron (ZOFRAN-ODT) 8 MG disintegrating tablet Take 1 tablet (8 mg total) by mouth every 8 (eight) hours as needed for nausea. 07/24/15  Yes Elvina SidleLauenstein, Kurt, MD  traMADol (ULTRAM) 50 MG tablet Take 2 tablets (100 mg total) by mouth 2 (two) times daily. 12/05/16  Yes Kirsteins, Victorino SparrowAndrew E, MD   No results found.  Positive ROS: All other systems have been reviewed and were otherwise negative with the exception of those mentioned in the HPI and as above.  Physical Exam: General: Alert, no acute distress Cardiovascular: No pedal edema Respiratory: No cyanosis, no use of accessory musculature GI: No organomegaly, abdomen is soft and non-tender Skin: No lesions in the area of chief complaint Neurologic: Sensation intact distally Psychiatric: Patient is competent for consent with normal mood and affect Lymphatic: No axillary or cervical lymphadenopathy    Assessment: Closed left patella fracture  Plan: -Plan for open reduction and internal fixation today of the left patella.  We reviewed the risk and benefits of this procedure as well as the indications.  All questions were answered to her satisfaction.  We will plan on admitting her postoperatively for overnight observation and pain control.  She will also need physical therapy.  We will keep her touchdown weightbearing for the first 2 weeks postoperatively.    Yolonda KidaJason Patrick Rogers, MD Cell [phone]    10/09/2017 2:10 PM

## 2017-10-09 NOTE — Progress Notes (Signed)
Orthopedic Tech Progress Note Patient Details:  Veronica [name] 10/07/1961 [id number] Brace completed by [employer]. Patient ID: Veronica PateMarion A [name], female   DOB: 07/14/1961, 56 y.o.   MRN: [id number]   Veronica [name], Veronica [name] 10/09/2017, 7:01 PM

## 2017-10-09 NOTE — Anesthesia Procedure Notes (Addendum)
Anesthesia Regional Block: Femoral nerve block   Pre-Anesthetic Checklist: ,, timeout performed, Correct Patient, Correct Site, Correct Laterality, Correct Procedure,, site marked, risks and benefits discussed, Surgical consent,  Pre-op evaluation,  At surgeon's request and post-op pain management  Laterality: Left  Prep: chloraprep       Needles:  Injection technique: Single-shot  Needle Type: Echogenic Stimulator Needle     Needle Length: 9cm  Needle Gauge: 21     Additional Needles:   Procedures:, nerve stimulator,,,, intact distal pulses,,,   Nerve Stimulator or Paresthesia:  Response: Quadriceps muscle contraction, 0.45 mA,   Additional Responses:   Narrative:  Start time: 10/09/2017 1:51 PM End time: 10/09/2017 1:59 PM Injection made incrementally with aspirations every 5 mL.  Performed by: Personally  Anesthesiologist: Achille RichHodierne, Mayar Whittier, MD  Additional Notes: Functioning IV was confirmed and monitors were applied.  A 90mm 21ga Arrow echogenic stimulator needle was used. Sterile prep and drape,hand hygiene and sterile gloves were used.  Negative aspiration and negative test dose prior to incremental administration of local anesthetic. The patient tolerated the procedure well.

## 2017-10-10 DIAGNOSIS — S82042A Displaced comminuted fracture of left patella, initial encounter for closed fracture: Secondary | ICD-10-CM | POA: Diagnosis not present

## 2017-10-10 LAB — BASIC METABOLIC PANEL
Anion gap: 8 (ref 5–15)
BUN: 7 mg/dL (ref 6–20)
CALCIUM: 8.8 mg/dL — AB (ref 8.9–10.3)
CHLORIDE: 97 mmol/L — AB (ref 101–111)
CO2: 30 mmol/L (ref 22–32)
CREATININE: 0.85 mg/dL (ref 0.44–1.00)
GFR calc Af Amer: >60 mL/min (ref >60)
GFR calc non Af Amer: >60 mL/min (ref >60)
GLUCOSE: 218 mg/dL — AB (ref 65–99)
Potassium: 4.9 mmol/L (ref 3.5–5.1)
Sodium: 135 mmol/L (ref 135–145)

## 2017-10-10 LAB — CBC
HEMATOCRIT: 36.4 % (ref 36.0–46.0)
HEMOGLOBIN: 11.5 g/dL — AB (ref 12.0–15.0)
MCH: 29 pg (ref 26.0–34.0)
MCHC: 31.6 g/dL (ref 30.0–36.0)
MCV: 91.9 fL (ref 78.0–100.0)
Platelets: 260 10*3/uL (ref 150–400)
RBC: 3.96 MIL/uL (ref 3.87–5.11)
RDW: 13 % (ref 11.5–15.5)
WBC: 8.4 10*3/uL (ref 4.0–10.5)

## 2017-10-10 LAB — GLUCOSE, CAPILLARY
GLUCOSE-CAPILLARY: 208 mg/dL — AB (ref 65–99)
GLUCOSE-CAPILLARY: 258 mg/dL — AB (ref 65–99)
GLUCOSE-CAPILLARY: 217 mg/dL — AB (ref 65–99)
Glucose-Capillary: 235 mg/dL — ABNORMAL HIGH (ref 65–99)

## 2017-10-10 LAB — HIV ANTIBODY (ROUTINE TESTING W REFLEX): HIV Screen 4th Generation wRfx: NONREACTIVE

## 2017-10-10 MED ORDER — INSULIN NPH (HUMAN) (ISOPHANE) 100 UNIT/ML ~~LOC~~ SUSP
30.0000 [IU] | Freq: Two times a day (BID) | SUBCUTANEOUS | Status: DC
  Administered 2017-10-10 – 2017-10-14 (×8): 30 [IU] via SUBCUTANEOUS
  Filled 2017-10-10: qty 10

## 2017-10-10 NOTE — Evaluation (Signed)
Occupational Therapy Evaluation Patient Details Name: Veronica [name] MRN: [id number] DOB: 1961-06-06 Today's Date: 10/10/2017    History of Present Illness Veronica [name] is a 56 y.o. female who complains of left knee pain following a fall; s/p ORIF LLE.  Pt has a past medical history including Anxiety, Arthritis, CAD, Depression, DM, GERD, H/O diverticulitis of colon, Heart attack (08/2009), Neuropathy, OSA, Pneumonia, and Sciatica. She has a past surgical history that includes Nasal reconstruction; Tonsillectomy and adenoidectomy; Uvulopalatopharyngoplasty; Partial colectomy; Colonoscopy; and Patellar tendon repair (Left, 10/09/2017).   Clinical Impression   PTA (initial injury) Pt was independent in ADL and mobility. Since her dc from the ER and fall she has essentially been bed bound as she fell in a hole in her bathroom while using her crutches, requiring max A for ADL from her husband. Her bathroom is too small to [place] DME like RW. She is extremely motivated to regain her independence and very very interested in CIR level therapy. She is currently set up for UB ADL and max A for LB ADL. Pt requires +2 assist for safe transfers and will require skilled OT in the acute setting as well as post-acute rehab. Pt really wants CIR, but Pt will require SNF at least until she can transfer and move safer on her own. OT will continue to follow in the acute setting to address deficits listed below in problem list and maximize safety and independence in ADL and functional transfers. Next session to educate in AE for LB dressing and continue to work on transfers.     Follow Up Recommendations  CIR;SNF;Supervision/Assistance - 24 hour    Equipment Recommendations  Other (comment)(defer to next venue)    Recommendations for Other Services       Precautions / Restrictions Precautions Precautions: Fall Restrictions Weight Bearing Restrictions: Yes LLE Weight Bearing: Touchdown weight bearing       Mobility Bed Mobility Overal bed mobility: Needs Assistance Bed Mobility: Supine to Sit;Sit to Supine     Supine to sit: Mod assist Sit to supine: Mod assist   General bed mobility comments: Patient required assitance of left leg in and out of the bed. Was able to use her upper extremitys to help. Use of bed rails and HOB elevated to assist as well  Transfers Overall transfer level: Needs assistance Equipment used: Rolling walker (2 wheeled) Transfers: Sit to/from Stand Sit to Stand: Max assist         General transfer comment: Patient will require +2 assist in the future. Patient atrtmeped to transfer 3x with max. She was able to achieve stand 2x but reported knee pain. She appeared to be following TDWB percautions.    Balance Overall balance assessment: Needs assistance Sitting-balance support: Feet supported;No upper extremity supported Sitting balance-Leahy Scale: Fair   Postural control: Right lateral lean Standing balance support: Bilateral upper extremity supported Standing balance-Leahy Scale: Poor Standing balance comment: reliant on RW and external support from therapist                           ADL either performed or assessed with clinical judgement   ADL Overall ADL's : Needs assistance/impaired Eating/Feeding: Set up   Grooming: Set up;Sitting;Wash/dry hands;Wash/dry face Grooming Details (indicate cue type and reason): EOB Upper Body Bathing: Moderate assistance   Lower Body Bathing: Maximal assistance;Sitting/lateral leans   Upper Body Dressing : Min guard   Lower Body Dressing: Maximal assistance;+2 for physical assistance;+2 for  safety/equipment;Sit to/from stand   Toilet Transfer: Maximal assistance;RW(unsafe to attempt transfer at this time without more staff)   Toileting- Clothing Manipulation and Hygiene: Total assistance Toileting - Clothing Manipulation Details (indicate cue type and reason): Pt with purewick when OT entered      Functional mobility during ADLs: (deferred to next session) General ADL Comments: Pt limited by pain, very motivated     Vision         Perception     Praxis      Pertinent Vitals/Pain Pain Assessment: Faces Faces Pain Scale: Hurts whole lot Pain Location: left knee  Pain Descriptors / Indicators: Aching;Sharp Pain Intervention(s): Limited activity within patient's tolerance;Monitored during session;Premedicated before session;Repositioned;Ice applied     Hand Dominance Right   Extremity/Trunk Assessment Upper Extremity Assessment Upper Extremity Assessment: Overall WFL for tasks assessed;Generalized weakness   Lower Extremity Assessment Lower Extremity Assessment: LLE deficits/detail LLE Deficits / Details: Pt's LLE in bledsoe brace LLE: Unable to fully assess due to pain LLE Sensation: history of peripheral neuropathy LLE Coordination: decreased gross motor   Cervical / Trunk Assessment Cervical / Trunk Assessment: Other exceptions(extra body habitus)   Communication Communication Communication: No difficulties   Cognition Arousal/Alertness: Awake/alert Behavior During Therapy: WFL for tasks assessed/performed Overall Cognitive Status: Within Functional Limits for tasks assessed                                     General Comments  Patient in locked hinged knee brace     Exercises     Shoulder Instructions      Home Living Family/patient expects to be discharged to:: Skilled nursing facility Living Arrangements: Spouse/significant other Available Help at Discharge: Family Type of Home: House Home Access: Stairs to enter Secretary/administratorntrance Stairs-Number of Steps: 3 Entrance Stairs-Rails: Can reach both Home Layout: One level               Home Equipment: Walker - 2 wheels   Additional Comments: Pt's husband has worked in rehab and so has experience assisting people recovering      Prior Functioning/Environment Level of Independence:  Independent        Comments: Patient reports her mobility was limited by back pain         OT Problem List: Decreased strength;Decreased range of motion;Decreased activity tolerance;Impaired balance (sitting and/or standing);Decreased knowledge of use of DME or AE;Decreased knowledge of precautions;Obesity;Pain      OT Treatment/Interventions: Self-care/ADL training;Energy conservation;DME and/or AE instruction;Therapeutic activities;Patient/family education;Balance training    OT Goals(Current goals can be found in the care plan section) Acute Rehab OT Goals Patient Stated Goal: to get to inpatient rehab to get back to independent - and back to [employer] [facility] OT Goal Formulation: With patient Time For Goal Achievement: 10/24/17 Potential to Achieve Goals: Good ADL Goals Pt Will Perform Lower Body Bathing: with supervision;sitting/lateral leans;with adaptive equipment Pt Will Perform Lower Body Dressing: with adaptive equipment;with min assist;sit to/from stand;with caregiver independent in assisting Pt Will Transfer to Toilet: with supervision;stand pivot transfer;bedside commode Pt Will Perform Toileting - Clothing Manipulation and hygiene: with supervision;sitting/lateral leans Additional ADL Goal #1: Pt will perform bed mobility at supervision level prior to initiating ADL activity  OT Frequency: Min 3X/week   Barriers to D/C: Inaccessible home environment  Pt has a hole in her bathroom that she's already fallen in since the initial injury - bathroom is small and cannot  fit DME       Co-evaluation              AM-PAC PT "6 Clicks" Daily Activity     Outcome Measure Help from another person eating meals?: None Help from another person taking care of personal grooming?: A Little Help from another person toileting, which includes using toliet, bedpan, or urinal?: A Lot Help from another person bathing (including washing, rinsing, drying)?: A Lot Help from another person  to put on and taking off regular upper body clothing?: A Little Help from another person to put on and taking off regular lower body clothing?: A Lot 6 Click Score: 16   End of Session Equipment Utilized During Treatment: Gait belt;Other (comment);Rolling walker;Oxygen(Bledsoe brace; 2L) Nurse Communication: Mobility status;Other (comment)(pure wick needs to be reapplied)  Activity Tolerance: Patient limited by pain Patient left: in bed;with call bell/phone within reach;with bed alarm set;with SCD's reapplied;with nursing/sitter in room  OT Visit Diagnosis: Unsteadiness on feet (R26.81);Other abnormalities of gait and mobility (R26.89);History of falling (Z91.81);Muscle weakness (generalized) (M62.81);Pain Pain - Right/Left: Left Pain - part of body: Leg;Knee                Time: [id number] OT Time Calculation (min): 28 min Charges:  OT General Charges $OT Visit: 1 Visit OT Evaluation $OT Eval Moderate Complexity: 1 Mod OT Treatments $Self Care/Home Management : 8-22 mins G-Codes: OT G-codes **NOT FOR INPATIENT CLASS** Functional Assessment Tool Used: AM-PAC 6 Clicks Daily Activity Functional Limitation: Self care Self Care Current Status (V4098(G8987): At least 40 percent but less than 60 percent impaired, limited or restricted Self Care Goal Status (J1914(G8988): At least 1 percent but less than 20 percent impaired, limited or restricted   Sherryl MangesLaura Pruitt Taboada OTR/L [phone]  Evern BioLaura J Colby Catanese 10/10/2017, 5:03 PM

## 2017-10-10 NOTE — Op Note (Addendum)
10/09/2017  9:16 AM  PATIENT:  Veronica PateMarion A [name]    PRE-OPERATIVE DIAGNOSIS:  Left patella fracture  POST-OPERATIVE DIAGNOSIS:  Same  PROCEDURE:   1. Left partial patellectomy 2.  Left patella tendon advancement and repair  SURGEON:  Yolonda KidaJason Patrick Jaelah Hauth, MD  Assistant:  April Green, RNFA.  ANESTHESIA:   General  PREOPERATIVE INDICATIONS:  Veronica PateMarion A [name] is a  56 y.o. female with a diagnosis of Left Comminuted and displaced patella fracture who elected for surgical management in order to restore the function of the extensor mechanism.    The risks benefits and alternatives were discussed with the patient preoperatively including but not limited to the risks of infection, bleeding, nerve injury, cardiopulmonary complications, the need for revision surgery, hardware prominence, hardware failure, the need for hardware removal, nonunion, malunion, posttraumatic arthritis, stiffness, loss of strength and function, among others, and the patient was willing to proceed.  OPERATIVE IMPLANTS: 2, 4.5 mm bio composite Arthrex corkscrews  OPERATIVE FINDINGS: Displaced And comminuted patella fracture.  There was concomitant patella tendon avulsion from the inferior pole piece.  The inferior pole may fragment was free of any soft tissue attachment and unable to be fixated.  Therefore, the decision was made to perform partial patellectomy and tendon advancement.  OPERATIVE PROCEDURE: The patient was brought to the operating room and placed in the supine position. General anesthesia was administered. IV antibiotics were given. The lower extremity was prepped and draped in usual sterile fashion. The leg was elevated and exsanguinated and the tourniquet was inflated. Time out was performed.   Anterior incision was made over the patella and the fracture fragments identified and cleaned of hematoma. The retinaculum was torn on either side.  Immediately when we got down to the level of the fracture, it  was noted that the main inferior pole fragment piece was void of any soft tissue attachment.  This was not able to be primarily reduced.  The decision was then made to perform partial patellectomy.  The inferior pole was removed and discarded.  The comminuted fragments in the superior portion of the patella tendon were thus excised as well.  We next turned our attention to the patella tendon advancement and repair back to the superior pole.  We biologically prepped to the inferior portion of the superior patella fragment.  This was performed with 15 blade knife as well as rongeur.  At the conclusion of the biologic prep the cancellus bone of the superior patella fragment was bleeding adequately.  Next we inserted 2 4.5 mm bio composite corkscrew suture anchors into the superior patella pole fragment.  These were double loaded suture anchors.  We then ran each of the 4 suture limbs in a similar fashion into the patella tendon with a free needle.  Krakw style sutures were passed down for throws and back up for throws for each of the 4 limbs.  Next we secured the patella tendon to the superior patella pole fragment.  Following this repair technique the knee was ranged from 0-45 with no gapping noted at the tendon to bone interface.  We next repaired the retinaculum with a running #2 FiberWire suture.Again we ranged the knee from 0-45 with no gapping at the repair site.   The knee was then copiously irrigated with normal saline.  We then began closure of the primary knee incision.  The deep fat layer was closed with a 0 Vicryl.  The subcutaneous tissue was closed with a 2-0 interrupted Vicryl layer.  Skin was closed with staples.  Standard sterile dressing was applied to the knee as well as a Bledsoe knee brace locked in extension.  All counts were correct 2.  There were no immediate complications.  The patient was transferred to PACU in stable condition.   Disposition:  Veronica [name] will be touchdown  weightbearing to the left lower extremity in full extension for the next 4 weeks.  We will begin physical therapy and occupational therapy while she is admitted to my service.  She will have Lovenox for DVT prophylaxis while in the hospital and we will transition her to twice daily aspirin thereafter for 6 weeks.

## 2017-10-10 NOTE — Evaluation (Signed)
Physical Therapy Evaluation Patient Details Name: Veronica [name] MRN: [id number] DOB: 07/22/1961 Today's Date: 10/10/2017   History of Present Illness  Veronica [name] is a 56 y.o. female who complains of left knee pain following a fall; s/p ORIF LLE.  Pt has a past medical history including Anxiety, Arthritis, CAD, Depression, DM, GERD, H/O diverticulitis of colon, Heart attack (08/2009), Neuropathy, OSA, Pneumonia, and Sciatica. She has a past surgical history that includes Nasal reconstruction; Tonsillectomy and adenoidectomy; Uvulopalatopharyngoplasty; Partial colectomy; Colonoscopy; and Patellar tendon repair (Left, 10/09/2017).  Clinical Impression  Patient presents with decreased ability to transfer. She was unable to maintain a standing position and unable to transfer to a chair. Her husband works durning the day and she has 3 steps into her house. She used crutches prior to the surgery after the fall but fell using the crutches. She would benefit from further skilled therapy. She expressed an interest in inpatient rehab. She is very motivated and would benefit from a more aggressive rehab given her prior functional mobility. If she can not go to CIR she would benefit from further skilled therapy at a SNF.     Follow Up Recommendations CIR;SNF    Equipment Recommendations  None recommended by PT    Recommendations for Other Services Rehab consult     Precautions / Restrictions Precautions Precautions: Fall Restrictions Weight Bearing Restrictions: Yes LLE Weight Bearing: Touchdown weight bearing      Mobility  Bed Mobility Overal bed mobility: Needs Assistance Bed Mobility: Supine to Sit;Sit to Supine     Supine to sit: Mod assist Sit to supine: Mod assist   General bed mobility comments: Patient required assitance of left leg in and out of the bed. Was able to use her upper extremitys to help.   Transfers Overall transfer level: Needs assistance Equipment used:  Rolling walker (2 wheeled) Transfers: Sit to/from Stand Sit to Stand: Max assist         General transfer comment: Patient will require +2 assist in the future. Patient atrtmeped to transfer 3x with max. She was able to achieve stand 2x but reported knee pain. She appeared to be following TDWB percautions. She wasunable to safely transfer to a chair.   Ambulation/Gait                Stairs            Wheelchair Mobility    Modified Rankin (Stroke Patients Only)       Balance Overall balance assessment: Needs assistance Sitting-balance support: Feet supported;No upper extremity supported Sitting balance-Leahy Scale: Fair   Postural control: Right lateral lean Standing balance support: Bilateral upper extremity supported Standing balance-Leahy Scale: Poor                               Pertinent Vitals/Pain Pain Assessment: Faces Faces Pain Scale: Hurts whole lot Pain Location: left knee  Pain Descriptors / Indicators: Aching;Sharp Pain Intervention(s): Limited activity within patient's tolerance    Home Living Family/patient expects to be discharged to:: Skilled nursing facility Living Arrangements: Spouse/significant other Available Help at Discharge: Family Type of Home: House Home Access: Stairs to enter Entrance Stairs-Rails: Can reach both Entrance Stairs-Number of Steps: 3 Home Layout: One level Home Equipment: Walker - 2 wheels      Prior Function Level of Independence: Independent         Comments: Patient reports her mobility was limited by back  pain      Hand Dominance   Dominant Hand: Right    Extremity/Trunk Assessment   Upper Extremity Assessment Upper Extremity Assessment: Defer to OT evaluation    Lower Extremity Assessment Lower Extremity Assessment: LLE deficits/detail LLE: Unable to fully assess due to pain       Communication   Communication: No difficulties  Cognition Arousal/Alertness:  Awake/alert Behavior During Therapy: WFL for tasks assessed/performed Overall Cognitive Status: Within Functional Limits for tasks assessed                                        General Comments General comments (skin integrity, edema, etc.): Patient in locked hinged knee brace     Exercises     Assessment/Plan    PT Assessment Patient needs continued PT services  PT Problem List Decreased strength;Decreased range of motion;Decreased balance;Decreased activity tolerance;Decreased mobility;Decreased coordination;Pain;Decreased knowledge of use of DME       PT Treatment Interventions Gait training;Stair training;Functional mobility training;Therapeutic activities;Therapeutic exercise;DME instruction;Balance training    PT Goals (Current goals can be found in the Care Plan section)  Acute Rehab PT Goals Patient Stated Goal: to have less pain  PT Goal Formulation: With patient Time For Goal Achievement: 10/17/17 Potential to Achieve Goals: Fair    Frequency Min 5X/week   Barriers to discharge Inaccessible home environment;Decreased caregiver support Husband works and patient has 3 stpes into her house.     Co-evaluation               AM-PAC PT "6 Clicks" Daily Activity  Outcome Measure Difficulty turning over in bed (including adjusting bedclothes, sheets and blankets)?: A Lot Difficulty moving from lying on back to sitting on the side of the bed? : A Lot Difficulty sitting down on and standing up from a chair with arms (e.g., wheelchair, bedside commode, etc,.)?: A Lot Help needed moving to and from a bed to chair (including a wheelchair)?: Total Help needed walking in hospital room?: Total Help needed climbing 3-5 steps with a railing? : Total 6 Click Score: 9    End of Session Equipment Utilized During Treatment: Gait belt Activity Tolerance: Patient limited by pain Patient left: in bed;with call bell/phone within reach;with nursing/sitter in  room Nurse Communication: Mobility status PT Visit Diagnosis: Unsteadiness on feet (R26.81);Muscle weakness (generalized) (M62.81);Pain Pain - Right/Left: Left Pain - part of body: Knee    Time: 1210-1300 PT Time Calculation (min) (ACUTE ONLY): 50 min   Charges:   PT Evaluation $PT Eval Moderate Complexity: 1 Mod     PT G Codes:   PT G-Codes **NOT FOR INPATIENT CLASS** Functional Assessment Tool Used: AM-PAC 6 Clicks Basic Mobility Functional Limitation: Mobility: Walking and moving around Mobility: Walking and Moving Around Current Status (V4098(G8978): At least 80 percent but less than 100 percent impaired, limited or restricted Mobility: Walking and Moving Around Goal Status [phone](G8979): At least 80 percent but less than 100 percent impaired, limited or restricted     Dessie Comaavid J Annetta Deiss PT DPT  10/10/2017, 2:29 PM

## 2017-10-10 NOTE — Progress Notes (Signed)
Patient ID: Veronica PateMarion A Cedotal, female   DOB: 06/16/1961, 56 y.o.   MRN: [id number] Subjective: 1 Day Post-Op Procedure(s) (LRB): PATELLA TENDON REPAIR (Left)    Patient reports pain as mild to moderate Anxious about activity level and ability to perform ADLs at home  Objective:   VITALS:   Vitals:   10/09/17 2007 10/10/17 0528  BP: (!) 144/60 (P) 123/65  Pulse: 99 (P) 63  Resp:    Temp: 99.5 F (37.5 C) (P) 97.9 F (36.6 C)  SpO2: 94% (P) 100%    Neurovascular intact Incision: dressing C/D/I  LABS Recent Labs    10/09/17 1235 10/09/17 2042 10/10/17 0528  HGB 12.0 11.9* 11.5*  HCT 36.4 36.4 36.4  WBC 7.2 10.9* 8.4  PLT 287 274 260    Recent Labs    10/09/17 1235 10/09/17 2042 10/10/17 0528  NA 137  --  135  K 4.2  --  4.9  BUN 7  --  7  CREATININE 0.75 0.73 0.85  GLUCOSE 108*  --  218*    No results for input(s): LABPT, INR in the last 72 hours.   Assessment/Plan: 1 Day Post-Op Procedure(s) (LRB): PATELLA TENDON REPAIR (Left)   Advance diet Up with therapy Plan for discharge tomorrow   Will need to see how she functions with PT per Aundria Rudogers restrictions Probable D/C to home versus rehab tomorrow

## 2017-10-11 DIAGNOSIS — S82042A Displaced comminuted fracture of left patella, initial encounter for closed fracture: Secondary | ICD-10-CM | POA: Diagnosis not present

## 2017-10-11 LAB — GLUCOSE, CAPILLARY
GLUCOSE-CAPILLARY: 149 mg/dL — AB (ref 65–99)
GLUCOSE-CAPILLARY: 210 mg/dL — AB (ref 65–99)
GLUCOSE-CAPILLARY: 203 mg/dL — AB (ref 65–99)
GLUCOSE-CAPILLARY: 259 mg/dL — AB (ref 65–99)

## 2017-10-11 NOTE — Progress Notes (Signed)
Orthopedics Progress Note  Subjective: Patient feeling some better this morning.  She has not been able to mobilize into a chair yet or go to the bathroom but did stand at the bedside.  Objective:  Vitals:   10/10/17 2007 10/11/17 0447  BP: (!) 130/55 117/81  Pulse: 77 64  Resp:    Temp: 98.3 F (36.8 C) 98.1 F (36.7 C)  SpO2: 99% 100%    General: Awake and alert  Musculoskeletal: Dressing and TROM brace intact. NVI distally. No cords. Negative Homan's Neurovascularly intact  Lab Results  Component Value Date   WBC 8.4 10/10/2017   HGB 11.5 (L) 10/10/2017   HCT 36.4 10/10/2017   MCV 91.9 10/10/2017   PLT 260 10/10/2017       Component Value Date/Time   NA 135 10/10/2017 0528   K 4.9 10/10/2017 0528   CL 97 (L) 10/10/2017 0528   CO2 30 10/10/2017 0528   GLUCOSE 218 (H) 10/10/2017 0528   BUN 7 10/10/2017 0528   CREATININE 0.85 10/10/2017 0528   CALCIUM 8.8 (L) 10/10/2017 0528   GFRNONAA >60 10/10/2017 0528   GFRAA >60 10/10/2017 0528    Lab Results  Component Value Date   INR 1.0 11/28/2016   INR 0.96 11/17/2016   INR 0.96 11/15/2016    Assessment/Plan: POD #2 s/p Procedure(s): PATELLA TENDON REPAIR Patient continues to recover and will continue OOB with therapy. D/C planning - depends on PT today and tomorrow but may be SNF. DVT prophylaxis - mechanical and lovenox  Almedia BallsSteven R. Ranell PatrickNorris, MD 10/11/2017 8:48 AM

## 2017-10-11 NOTE — Plan of Care (Signed)
  Clinical Measurements: Will remain free from infection 10/11/2017 1041 - Progressing by Darrow BussingArcilla, Amiri Riechers M, RN   Nutrition: Adequate nutrition will be maintained 10/11/2017 1041 - Progressing by Darrow BussingArcilla, Artemisia Auvil M, RN   Elimination: Will not experience complications related to bowel motility 10/11/2017 1041 - Progressing by Darrow BussingArcilla, Javoris Star M, RN   Pain Managment: General experience of comfort will improve 10/11/2017 1041 - Progressing by Darrow BussingArcilla, Besan Ketchem M, RN   Safety: Ability to remain free from injury will improve 10/11/2017 1041 - Progressing by Darrow BussingArcilla, Nashira Mcglynn M, RN

## 2017-10-11 NOTE — Progress Notes (Signed)
Physical Therapy Treatment Patient Details Name: Veronica [name] Burdo MRN: [id number] DOB: 09/21/1961 Today's Date: 10/11/2017    History of Present Illness Veronica [name] Coberly is [name] 56 y.o. female who complains of left knee pain following [name] fall; s/p ORIF LLE.  Pt has [name] past medical history including Anxiety, Arthritis, CAD, Depression, DM, GERD, H/O diverticulitis of colon, Heart attack (08/2009), Neuropathy, OSA, Pneumonia, and Sciatica. She has [name] past surgical history that includes Nasal reconstruction; Tonsillectomy and adenoidectomy; Uvulopalatopharyngoplasty; Partial colectomy; Colonoscopy; and Patellar tendon repair (Left, 10/09/2017).    PT Comments    Patient is making progress toward mobility goals. Pt was able to transfer with less difficulty and assistance thsi session however does require +2 for safety with OOB mobility. Work on stand pivot transfers and ambulation next session.    Follow Up Recommendations  CIR;SNF     Equipment Recommendations  None recommended by PT    Recommendations for Other Services Rehab consult     Precautions / Restrictions Precautions Precautions: Fall Restrictions Weight Bearing Restrictions: Yes LLE Weight Bearing: Touchdown weight bearing    Mobility  Bed Mobility Overal bed mobility: Needs Assistance Bed Mobility: Supine to Sit     Supine to sit: Min assist     General bed mobility comments: assist to bring L LE from EOB; cues for sequencing and technique  Transfers Overall transfer level: Needs assistance Equipment used: Rolling walker (2 wheeled) Transfers: Sit to/from Stand;Lateral/Scoot Transfers Sit to Stand: Mod assist;+2 physical assistance        Lateral/Scoot Transfers: Mod assist;+2 physical assistance General transfer comment: lateral transfer EOB to recliner and sit to stand practiced from recliner; cues for sequencing, technique, and safe use of AD; pt able to maintain standing with TDWB on L LE and able to perform  heel raises and practice pivoting  Ambulation/Gait                 Stairs            Wheelchair Mobility    Modified Rankin (Stroke Patients Only)       Balance Overall balance assessment: Needs assistance Sitting-balance support: Feet supported;No upper extremity supported Sitting balance-Leahy Scale: Fair     Standing balance support: Bilateral upper extremity supported Standing balance-Leahy Scale: Poor                              Cognition Arousal/Alertness: Awake/alert Behavior During Therapy: WFL for tasks assessed/performed Overall Cognitive Status: Within Functional Limits for tasks assessed                                        Exercises      General Comments        Pertinent Vitals/Pain Pain Assessment: Faces Faces Pain Scale: Hurts even more Pain Location: L LE when in dependent position Pain Descriptors / Indicators: Grimacing;Guarding;Moaning Pain Intervention(s): Limited activity within patient's tolerance;Monitored during session;Premedicated before session;Repositioned    Home Living                      Prior Function            PT Goals (current goals can now be found in the care plan section) Acute Rehab PT Goals PT Goal Formulation: With patient Time For Goal Achievement: 10/17/17 Potential to Achieve Goals: Fair  Progress towards PT goals: Progressing toward goals    Frequency    Min 5X/week      PT Plan Current plan remains appropriate    Co-evaluation              AM-PAC PT "6 Clicks" Daily Activity  Outcome Measure  Difficulty turning over in bed (including adjusting bedclothes, sheets and blankets)?: [name] Little Difficulty moving from lying on back to sitting on the side of the bed? : Unable Difficulty sitting down on and standing up from [name] chair with arms (e.g., wheelchair, bedside commode, etc,.)?: Unable Help needed moving to and from [name] bed to chair (including [name]  wheelchair)?: [name] Lot Help needed walking in hospital room?: [name] Lot Help needed climbing 3-5 steps with [name] railing? : Total 6 Click Score: 10    End of Session Equipment Utilized During Treatment: Gait belt Activity Tolerance: Patient tolerated treatment well Patient left: with call bell/phone within reach;in chair Nurse Communication: Mobility status PT Visit Diagnosis: Unsteadiness on feet (R26.81);Muscle weakness (generalized) (M62.81);Pain Pain - Right/Left: Left Pain - part of body: Knee     Time: [id number] PT Time Calculation (min) (ACUTE ONLY): 42 min  Charges:  $Therapeutic Activity: 38-52 mins                    G Codes:       Erline LevineKellyn Iann Rodier, PTA Pager: [phone]     Carolynne EdouardKellyn R Brayant Dorr 10/11/2017, 4:00 PM

## 2017-10-11 NOTE — Progress Notes (Signed)
Rehab Admissions Coordinator Note:  Patient was screened by Trish MageLogue, Danahi Reddish M for appropriateness for an Inpatient Acute Rehab Consult.  At this time, patient is under observation status.  May not have medical necessity to support an inpatient rehab admission.  If MD wishes to pursue inpatient rehab, can order rehab consult.  Lelon FrohlichLogue, Azriella Mattia M 10/11/2017, 11:08 AM  I can be reached at [phone].

## 2017-10-11 NOTE — [place] FL2 (Signed)
[place] MEDICAID FL2 LEVEL OF CARE SCREENING TOOL     IDENTIFICATION  Patient Name: Veronica PateMarion A Stoker Birthdate: 12/26/1960 Sex: female Admission Date (Current Location): 10/09/2017  [facility] [place]   Facility and Address:  [facility] [place], [address], [place], [place] [address]      Provider Number: [id number]  Attending Physician Name and Address:  Yolonda Kidaogers, Jason Patrick, MD  Relative Name and Phone Number:       Current Level of Care: Hospital Recommended Level of Care: Skilled Nursing Facility Prior Approval Number:    Date Approved/Denied:   PASRR Number: Pending; manual review  Discharge Plan: SNF    Current Diagnoses: Patient Active Problem List   Diagnosis Date Noted  . Displaced comminuted fracture of left patella, initial encounter for closed fracture 10/09/2017  . H/O diverticulitis of colon   . Lumbar degenerative disc disease 02/02/2017  . Fibromyalgia syndrome 02/02/2017  . Hyponatremia 11/14/2016  . Research subject 11/14/2016  . Hypoxia 11/13/2016  . Respiratory failure with hypoxia (HCC) 11/13/2016  . SIRS (systemic inflammatory response syndrome) (HCC) 11/13/2016  . Influenza A 11/13/2016  . Anxiety and depression 11/13/2016  . Thyroid nodule 11/13/2016  . Diabetic polyneuropathy associated with type 2 diabetes mellitus (HCC) 08/15/2016  . Chronic midline low back pain 08/15/2016  . Lumbar facet arthropathy 08/15/2016  . Cough 08/15/2015  . OSA (obstructive sleep apnea) 08/15/2015  . Sciatica 08/15/2015  . Diabetes mellitus (HCC) 08/15/2015  . Hiatal hernia 08/15/2015  . GERD (gastroesophageal reflux disease) 08/15/2015    Orientation RESPIRATION BLADDER Height & Weight     Self, Time, Situation, Place  Normal Continent Weight: 256 lb (116.1 kg) Height:  5\' 5"  (165.1 cm)  BEHAVIORAL SYMPTOMS/MOOD NEUROLOGICAL BOWEL NUTRITION STATUS      Continent Diet(carb modified)  AMBULATORY STATUS  COMMUNICATION OF NEEDS Skin   Extensive Assist Verbally Surgical wounds(closed left leg incision, compression wrap dressing)                       Personal Care Assistance Level of Assistance  Bathing, Feeding, Dressing Bathing Assistance: Limited assistance Feeding assistance: Independent Dressing Assistance: Limited assistance     Functional Limitations Info  Hearing, Speech, Sight Sight Info: Adequate Hearing Info: Adequate Speech Info: Adequate    SPECIAL CARE FACTORS FREQUENCY  PT (By licensed PT), OT (By licensed OT)     PT Frequency: 5x/wk OT Frequency: 5x/wk            Contractures Contractures Info: Not present    Additional Factors Info  Code Status, Allergies, Insulin Sliding Scale Code Status Info: Full Allergies Info: NKA   Insulin Sliding Scale Info: 0-15 units 3x/day with meals; 0-5 units daily at bedtime; Novolin/Humulin 30 units 2x/day before meals       Current Medications (10/11/2017):  This is the current hospital active medication list Current Facility-Administered Medications  Medication Dose Route Frequency Provider Last Rate Last Dose  . acetaminophen (TYLENOL) tablet 1,000 mg  1,000 mg Oral Q6H Yolonda Kidaogers, Jason Patrick, MD   1,000 mg at 10/11/17 1225  . celecoxib (CELEBREX) capsule 200 mg  200 mg Oral Q12H Yolonda Kidaogers, Jason Patrick, MD   200 mg at 10/11/17 [id number]  . docusate sodium (COLACE) capsule 100 mg  100 mg Oral BID Yolonda Kidaogers, Jason Patrick, MD   100 mg at 10/11/17 [id number]  . enoxaparin (LOVENOX) injection 40 mg  40 mg Subcutaneous Q24H Yolonda Kidaogers, Jason Patrick, MD  40 mg at 10/11/17 0741  . FLUoxetine (PROZAC) capsule 20 mg  20 mg Oral Daily Yolonda Kidaogers, Jason Patrick, MD   20 mg at 10/11/17 [id number]  . gabapentin (NEURONTIN) capsule 400 mg  400 mg Oral QID Yolonda Kidaogers, Jason Patrick, MD   400 mg at 10/11/17 1322  . hydrochlorothiazide (MICROZIDE) capsule 12.5 mg  12.5 mg Oral Daily Yolonda Kidaogers, Jason Patrick, MD   12.5 mg at 10/11/17 [id number]  . insulin aspart (novoLOG)  injection 0-15 Units  0-15 Units Subcutaneous TID WC Yolonda Kidaogers, Jason Patrick, MD   5 Units at 10/11/17 1226  . insulin aspart (novoLOG) injection 0-5 Units  0-5 Units Subcutaneous QHS Yolonda Kidaogers, Jason Patrick, MD   2 Units at 10/10/17 2115  . insulin NPH Human (HUMULIN N,NOVOLIN N) injection 30 Units  30 Units Subcutaneous BID AC Yolonda Kidaogers, Jason Patrick, MD   30 Units at 10/11/17 [phone]  . lactated ringers infusion   Intravenous Continuous Achille RichHodierne, Adam, MD 10 mL/hr at 10/11/17 0031    . methocarbamol (ROBAXIN) tablet 500 mg  500 mg Oral Q6H PRN Yolonda Kidaogers, Jason Patrick, MD   500 mg at 10/11/17 1321   Or  . methocarbamol (ROBAXIN) 500 mg in dextrose 5 % 50 mL IVPB  500 mg Intravenous Q6H PRN Yolonda Kidaogers, Jason Patrick, MD      . morphine 2 MG/ML injection 2 mg  2 mg Intravenous Q2H PRN Yolonda Kidaogers, Jason Patrick, MD   2 mg at 10/11/17 0028  . ondansetron (ZOFRAN) tablet 4 mg  4 mg Oral Q6H PRN Yolonda Kidaogers, Jason Patrick, MD       Or  . ondansetron [facility](ZOFRAN) injection 4 mg  4 mg Intravenous Q6H PRN Yolonda Kidaogers, Jason Patrick, MD      . oxyCODONE (Oxy IR/ROXICODONE) immediate release tablet 5-10 mg  5-10 mg Oral Q3H PRN Yolonda Kidaogers, Jason Patrick, MD   10 mg at 10/11/17 1321     Discharge Medications: Please see discharge summary for a list of discharge medications.  Relevant Imaging Results:  Relevant Lab Results:   Additional Information SS#: [id number]  Baldemar LenisElizabeth M Ayson Cherubini, LCSW

## 2017-10-11 NOTE — Clinical Social Work Note (Signed)
Clinical Social Work Assessment  Patient Details  Name: Veronica [name] MRN: [id number] Date of Birth: [birth year]  Date of referral:  10/11/17               Reason for consult:  Facility Placement, Discharge Planning                Permission sought to share information with:  Facility Sport and exercise psychologist, Family Supports Permission granted to share information::  Yes, Verbal Permission Granted  Name::     Merchant navy officer::  SNF  Relationship::  Husband  Contact Information:     Housing/Transportation Living arrangements for the past 2 months:  Single Family Home Source of Information:  Patient Patient Interpreter Needed:  None Criminal Activity/Legal Involvement Pertinent to Current Situation/Hospitalization:  No - Comment as needed Significant Relationships:  Spouse, Adult Children Lives with:  Self, Spouse Do you feel safe going back to the place where you live?  Yes Need for family participation in patient care:  No (Coment)  Care giving concerns:  Patient lives at home with spouse, but will need short term rehab at discharge in order to recover mobility and ability to care for self with spouse support.   Social Worker assessment / plan:  CSW met with patient to discuss recommendation for SNF. CSW provided list of facilities and discussed referral process. CSW will continue to follow.  Employment status:    Forensic scientist:  Managed Care PT Recommendations:  [place] / Referral to community resources:  [place]  Patient/Family's Response to care:  Patient agreeable to SNF placement.  Patient/Family's Understanding of and Emotional Response to Diagnosis, Current Treatment, and Prognosis:  Patient acknowledged that she's not happy about needing SNF at discharge and would prefer to go home, but she knows that she needs more help than what her husband can provide at this time. Patient was agreeable to looking over facilities.  Patient discussed how her [facility] had been [facility] in the past, but that was a while ago.  Emotional Assessment Appearance:  Appears stated age Attitude/Demeanor/Rapport:    Affect (typically observed):  Appropriate Orientation:  Oriented to Self, Oriented to Place, Oriented to  Time, Oriented to Situation Alcohol / Substance use:  Not Applicable Psych involvement (Current and /or in the community):  No (Comment)  Discharge Needs  Concerns to be addressed:  Care Coordination Readmission within the last 30 days:  No Current discharge risk:  Physical Impairment, Dependent with Mobility Barriers to Discharge:  Continued Medical Work up, Ship broker, Programmer, applications ([place])   Geralynn Ochs, LCSW 10/11/2017, 4:30 PM

## 2017-10-11 NOTE — Progress Notes (Signed)
Orthopedic Tech Progress Note Patient Details:  Silvio PateMarion A Klees 04/26/1961 [id number]  Ortho Devices Ortho Device/Splint Location: Trapeze bar Ortho Device/Splint Interventions: Application   Post Interventions Patient Tolerated: Well Instructions Provided: Care of device, Adjustment of device   Saul FordyceJennifer C Lelon Ikard 10/11/2017, 11:54 AM

## 2017-10-11 NOTE — Plan of Care (Signed)
  Progressing Activity: Risk for activity intolerance will decrease 10/11/2017 0237 - Progressing by Agnes LawrenceLackey, Josslin Sanjuan, RN Nutrition: Adequate nutrition will be maintained 10/11/2017 0237 - Progressing by Agnes LawrenceLackey, Santino Kinsella, RN Safety: Ability to remain free from injury will improve 10/11/2017 0237 - Progressing by Agnes LawrenceLackey, Canyon Lohr, RN Skin Integrity: Risk for impaired skin integrity will decrease 10/11/2017 0237 - Progressing by Agnes LawrenceLackey, Conall Vangorder, RN

## 2017-10-12 DIAGNOSIS — S82042A Displaced comminuted fracture of left patella, initial encounter for closed fracture: Secondary | ICD-10-CM | POA: Diagnosis not present

## 2017-10-12 LAB — GLUCOSE, CAPILLARY
GLUCOSE-CAPILLARY: 163 mg/dL — AB (ref 65–99)
Glucose-Capillary: 171 mg/dL — ABNORMAL HIGH (ref 65–99)
Glucose-Capillary: 137 mg/dL — ABNORMAL HIGH (ref 65–99)
Glucose-Capillary: 264 mg/dL — ABNORMAL HIGH (ref 65–99)

## 2017-10-12 NOTE — Social Work (Signed)
CSW met with patient and pt accepted bed offer from [facility]. CSW contacted SNF and advised of acceptance of bed offers. SNF will neeed to initiate insurance auth.  CSW will continue to follow.  PASSR pending.  Elissa Hefty, LCSW Clinical Social Worker [phone]

## 2017-10-12 NOTE — Anesthesia Postprocedure Evaluation (Signed)
Anesthesia Post Note  Patient: [name]  Procedure(s) Performed: PATELLA TENDON REPAIR (Left )     Patient location during evaluation: PACU Anesthesia Type: General Level of consciousness: awake Pain management: pain level controlled Vital Signs Assessment: post-procedure vital signs reviewed and stable Respiratory status: spontaneous breathing Cardiovascular status: stable Postop Assessment: no apparent nausea or vomiting Anesthetic complications: no    Last Vitals:  Vitals:   10/11/17 2024 10/12/17 0452  BP: (!) 141/73 (!) 126/56  Pulse: 92 85  Resp: 20 18  Temp: 37.2 C 36.8 C  SpO2: 97% 98%    Last Pain:  Vitals:   10/12/17 0832  TempSrc:   PainSc: 6    Pain Goal: Patients Stated Pain Goal: 0 (10/09/17 1900)               Herndon Grill JR,JOHN Susann GivensFRANKLIN

## 2017-10-12 NOTE — Social Work (Signed)
CSW reviewed chart.  PASSR pending for SNF and will need 30 day note signed by doctor. CSW left note on chart for MD to sign.  CSW will discuss bed offers with patient as SNF will need to obtain/initiate Insurance auth.  Keene BreathPatricia Analeigha Nauman, LCSW Clinical Social Worker [phone]

## 2017-10-12 NOTE — Progress Notes (Signed)
Orthopedics Progress Note  Subjective: Patient feeling better today.  She has had adequate pain control today after one episode of breakthrough pain overnight.  She denies any dizziness.  She does endorse some nausea that has resolved over this afternoon.  She denies chest pain.  Objective:  Vitals:   10/12/17 0452 10/12/17 1536  BP: (!) 126/56 (!) 151/66  Pulse: 85 89  Resp: 18   Temp: 98.3 F (36.8 C) 99 F (37.2 C)  SpO2: 98% 93%    General: Awake and alert  Musculoskeletal: Dressing and TROM brace intact. NVI distally.  Calf soft and nontender.  No pain with passive stretch. Neurovascularly intact  Lab Results  Component Value Date   WBC 8.4 10/10/2017   HGB 11.5 (L) 10/10/2017   HCT 36.4 10/10/2017   MCV 91.9 10/10/2017   PLT 260 10/10/2017       Component Value Date/Time   NA 135 10/10/2017 0528   K 4.9 10/10/2017 0528   CL 97 (L) 10/10/2017 0528   CO2 30 10/10/2017 0528   GLUCOSE 218 (H) 10/10/2017 0528   BUN 7 10/10/2017 0528   CREATININE 0.85 10/10/2017 0528   CALCIUM 8.8 (L) 10/10/2017 0528   GFRNONAA >60 10/10/2017 0528   GFRAA >60 10/10/2017 0528    Lab Results  Component Value Date   INR 1.0 11/28/2016   INR 0.96 11/17/2016   INR 0.96 11/15/2016    Assessment/Plan: POD #3 s/p Procedure(s): PATELLA TENDON REPAIR Patient continues to recover and will continue OOB with therapy. D/C planning -continue PT/OT.  Likely discharge to skilled nursing facility tomorrow or the following day pending insurance authorization. [place] FL2 signed on chart. DVT prophylaxis - mechanical and lovenox  Maryan RuedJason P Rogers MD 10/12/2017 5:31 PM

## 2017-10-12 NOTE — Progress Notes (Signed)
Occupational Therapy Treatment Patient Details Name: Veronica [name] MRN: [id number] DOB: 05/14/1961 Today's Date: 10/12/2017    History of present illness Veronica PateMarion A [name] is a 56 y.o. female who complains of left knee pain following a fall; s/p ORIF LLE.  Pt has a past medical history including Anxiety, Arthritis, CAD, Depression, DM, GERD, H/O diverticulitis of colon, Heart attack (08/2009), Neuropathy, OSA, Pneumonia, and Sciatica. She has a past surgical history that includes Nasal reconstruction; Tonsillectomy and adenoidectomy; Uvulopalatopharyngoplasty; Partial colectomy; Colonoscopy; and Patellar tendon repair (Left, 10/09/2017).   OT comments  Pt progressing slowly, requires an extended amount of time for all mobility. Continues to need 2 person assist to stand and for safety. Encouraged pt to perform as much of her self care as she is capable while seated in chair and ask for help only for those areas she cannot access. Updated d/c plan to SNF as pt not likely to tolerate 3 hours of intensive therapy. Will continue to follow acutely.  Follow Up Recommendations  SNF;Supervision/Assistance - 24 hour    Equipment Recommendations       Recommendations for Other Services      Precautions / Restrictions Precautions Precautions: Fall Required Braces or Orthoses: Other Brace/Splint Other Brace/Splint: Bledsoe brace on L knee Restrictions Weight Bearing Restrictions: Yes LLE Weight Bearing: Touchdown weight bearing       Mobility Bed Mobility Overal bed mobility: Needs Assistance Bed Mobility: Supine to Sit     Supine to sit: Min assist     General bed mobility comments: assist to bring L LE from EOB; cues for sequencing and technique  Transfers Overall transfer level: Needs assistance Equipment used: Rolling walker (2 wheeled) Transfers: Sit to/from Stand Sit to Stand: +2 physical assistance;Min assist         General transfer comment: cues for sequencing and hand  placement with sit<>stand, assist to rise and steady    Balance Overall balance assessment: Needs assistance Sitting-balance support: Feet supported;No upper extremity supported Sitting balance-Leahy Scale: Fair       Standing balance-Leahy Scale: Poor Standing balance comment: reliant on RW and external support from therapist                           ADL either performed or assessed with clinical judgement   ADL Overall ADL's : Needs assistance/impaired     Grooming: Set up;Sitting;Wash/dry hands;Wash/dry face   Upper Body Bathing: Minimal assistance;Sitting       Upper Body Dressing : Set up;Sitting   Lower Body Dressing: Maximal assistance;Bed level Lower Body Dressing Details (indicate cue type and reason): socks Toilet Transfer: Ambulation;Minimal assistance;+2 for safety/equipment;RW           Functional mobility during ADLs: Moderate assistance;Rolling walker;+2 for safety/equipment General ADL Comments: Pt requiring an extended amount of time for OOB, reporting neuropathic pain in L foot.     Vision       Perception     Praxis      Cognition Arousal/Alertness: Awake/alert Behavior During Therapy: WFL for tasks assessed/performed Overall Cognitive Status: Within Functional Limits for tasks assessed                                          Exercises     Shoulder Instructions       General Comments  Pertinent Vitals/ Pain       Pain Assessment: Faces Faces Pain Scale: Hurts even more Pain Location: balls of L foot Pain Descriptors / Indicators: Grimacing;Guarding;Shooting Pain Intervention(s): Monitored during session;Repositioned  Home Living                                          Prior Functioning/Environment              Frequency  Min 3X/week        Progress Toward Goals  OT Goals(current goals can now be found in the care plan section)  Progress towards OT goals:  Progressing toward goals  Acute Rehab OT Goals Patient Stated Goal: pain managed OT Goal Formulation: With patient Time For Goal Achievement: 10/24/17 Potential to Achieve Goals: Good  Plan Discharge plan needs to be updated    Co-evaluation    PT/OT/SLP Co-Evaluation/Treatment: Yes Reason for Co-Treatment: For patient/therapist safety   OT goals addressed during session: ADL's and self-care      AM-PAC PT "6 Clicks" Daily Activity     Outcome Measure   Help from another person eating meals?: None Help from another person taking care of personal grooming?: A Little Help from another person toileting, which includes using toliet, bedpan, or urinal?: A Lot Help from another person bathing (including washing, rinsing, drying)?: A Lot Help from another person to put on and taking off regular upper body clothing?: A Little Help from another person to put on and taking off regular lower body clothing?: A Lot 6 Click Score: 16    End of Session Equipment Utilized During Treatment: Gait belt;Rolling walker(Bledsoe brace)  OT Visit Diagnosis: Unsteadiness on feet (R26.81);Other abnormalities of gait and mobility (R26.89);History of falling (Z91.81);Muscle weakness (generalized) (M62.81);Pain Pain - Right/Left: Left Pain - part of body: Ankle and joints of foot   Activity Tolerance Patient limited by pain   Patient Left in chair;with call bell/phone within reach   Nurse Communication          Time: 1610-96040939-1014 OT Time Calculation (min): 35 min  Charges: OT General Charges $OT Visit: 1 Visit OT Treatments $Self Care/Home Management : 8-22 mins  10/12/2017 Martie RoundJulie Jaira Canady, OTR/L Pager: [phone] [place] Plan[place], [place] BailiffJulie Lynn 10/12/2017, 11:12 AM

## 2017-10-12 NOTE — Progress Notes (Signed)
Physical Therapy Treatment Patient Details Name: Veronica [name] MRN: [id number] DOB: 10/03/1961 Today's Date: 10/12/2017    History of Present Illness Veronica [name] is a 56 y.o. female who complains of left knee pain following a fall; s/p ORIF LLE.  Pt has a past medical history including Anxiety, Arthritis, CAD, Depression, DM, GERD, H/O diverticulitis of colon, Heart attack (08/2009), Neuropathy, OSA, Pneumonia, and Sciatica. She has a past surgical history that includes Nasal reconstruction; Tonsillectomy and adenoidectomy; Uvulopalatopharyngoplasty; Partial colectomy; Colonoscopy; and Patellar tendon repair (Left, 10/09/2017).    PT Comments    Patient is continues to make progress toward mobility goals. Pt was able to ambulate ~4 ft with mod A (+2 for safety) and RW. Pt continues to need assistance for all mobility. Current plan remains appropriate.   Follow Up Recommendations  SNF;Supervision for mobility/OOB     Equipment Recommendations  Other (comment)(TBD next venue)    Recommendations for Other Services       Precautions / Restrictions Precautions Precautions: Fall Required Braces or Orthoses: Other Brace/Splint Other Brace/Splint: Bledsoe brace on L knee Restrictions Weight Bearing Restrictions: Yes LLE Weight Bearing: Touchdown weight bearing    Mobility  Bed Mobility Overal bed mobility: Needs Assistance Bed Mobility: Supine to Sit     Supine to sit: Min assist     General bed mobility comments: assist to bring L LE from EOB; cues for sequencing and technique  Transfers Overall transfer level: Needs assistance Equipment used: Rolling walker (2 wheeled) Transfers: Sit to/from Stand Sit to Stand: +2 physical assistance;Min assist         General transfer comment: assist to mobilize L LE in preparation to stand; cues for safe hand placement and technique; pt able to maintain weight bearing status   Ambulation/Gait Ambulation/Gait assistance: Mod  assist;+2 safety/equipment Ambulation Distance (Feet): 4 Feet Assistive device: Rolling walker (2 wheeled) Gait Pattern/deviations: Step-to pattern     General Gait Details: cues for weight shifting, sequencing, and proximity to RW; assist for balance, advancing L LE and management of RW   Stairs            Wheelchair Mobility    Modified Rankin (Stroke Patients Only)       Balance Overall balance assessment: Needs assistance Sitting-balance support: Feet supported;No upper extremity supported Sitting balance-Leahy Scale: Fair       Standing balance-Leahy Scale: Poor Standing balance comment: reliant on RW and external support from therapist                            Cognition Arousal/Alertness: Awake/alert Behavior During Therapy: WFL for tasks assessed/performed Overall Cognitive Status: Within Functional Limits for tasks assessed                                        Exercises      General Comments        Pertinent Vitals/Pain Pain Assessment: Faces Faces Pain Scale: Hurts even more Pain Location: balls of L foot Pain Descriptors / Indicators: Grimacing;Guarding;Shooting Pain Intervention(s): Monitored during session;Repositioned    Home Living                      Prior Function            PT Goals (current goals can now be found in the care plan section)  Acute Rehab PT Goals Patient Stated Goal: pain managed Progress towards PT goals: Progressing toward goals    Frequency    Min 5X/week      PT Plan Current plan remains appropriate    Co-evaluation PT/OT/SLP Co-Evaluation/Treatment: Yes Reason for Co-Treatment: For patient/therapist safety PT goals addressed during session: Mobility/safety with mobility OT goals addressed during session: ADL's and self-care      AM-PAC PT "6 Clicks" Daily Activity  Outcome Measure  Difficulty turning over in bed (including adjusting bedclothes, sheets and  blankets)?: A Little Difficulty moving from lying on back to sitting on the side of the bed? : Unable Difficulty sitting down on and standing up from a chair with arms (e.g., wheelchair, bedside commode, etc,.)?: Unable Help needed moving to and from a bed to chair (including a wheelchair)?: A Little Help needed walking in hospital room?: A Lot Help needed climbing 3-5 steps with a railing? : Total 6 Click Score: 11    End of Session Equipment Utilized During Treatment: Gait belt Activity Tolerance: Patient tolerated treatment well Patient left: with call bell/phone within reach;in chair Nurse Communication: Mobility status PT Visit Diagnosis: Unsteadiness on feet (R26.81);Muscle weakness (generalized) (M62.81);Pain Pain - Right/Left: Left Pain - part of body: Knee     Time: [id number] PT Time Calculation (min) (ACUTE ONLY): 35 min  Charges:  $Gait Training: 8-22 mins                    G Codes:       Erline LevineKellyn Kianna Billet, PTA Pager: [phone]     Carolynne EdouardKellyn R Darwyn Ponzo 10/12/2017, 11:34 AM

## 2017-10-12 NOTE — Social Work (Signed)
CSW met with patient at bedside to discuss bed offers.  Pt will review and f/u with family to select SNF.  CSW reiterated that SNF needs to Initiate Authorization. CSW will continue to follow up.  Elissa Hefty, LCSW Clinical Social Worker [phone]

## 2017-10-12 NOTE — Plan of Care (Signed)
  Clinical Measurements: Will remain free from infection 10/12/2017 1304 - Progressing by Darrow BussingArcilla, Raeya Merritts M, RN   Nutrition: Adequate nutrition will be maintained 10/12/2017 1304 - Progressing by Darrow BussingArcilla, Stacy Sailer M, RN   Pain Managment: General experience of comfort will improve 10/12/2017 1304 - Progressing by Darrow BussingArcilla, Kemet Nijjar M, RN   Safety: Ability to remain free from injury will improve 10/12/2017 1304 - Progressing by Darrow BussingArcilla, Shinika Estelle M, RN

## 2017-10-12 NOTE — Progress Notes (Signed)
Rehab Admissions Coordinator Note:  Patient was screened by Trish MageLogue, Sheneika Walstad M for appropriateness for an Inpatient Acute Rehab Consult.  At this time, we are recommending Skilled Nursing Facility.  I doubt that we can justify an inpatient rehab admission; I doubt that [employer]BS would approve an inpatient rehab admission given current diagnosis.  However, if MD wishes to pursue inpatient rehab, an inpatient rehab consult would need to be ordered.  Trish MageLogue, Dennis Hegeman M 10/12/2017, 10:28 AM  I can be reached at [phone].

## 2017-10-13 ENCOUNTER — Encounter (HOSPITAL_COMMUNITY): Payer: Self-pay | Admitting: Orthopedic Surgery

## 2017-10-13 ENCOUNTER — Other Ambulatory Visit: Payer: Self-pay

## 2017-10-13 DIAGNOSIS — S82042A Displaced comminuted fracture of left patella, initial encounter for closed fracture: Secondary | ICD-10-CM | POA: Diagnosis not present

## 2017-10-13 LAB — GLUCOSE, CAPILLARY
GLUCOSE-CAPILLARY: 200 mg/dL — AB (ref 65–99)
Glucose-Capillary: 97 mg/dL (ref 65–99)
Glucose-Capillary: 191 mg/dL — ABNORMAL HIGH (ref 65–99)
Glucose-Capillary: 210 mg/dL — ABNORMAL HIGH (ref 65–99)

## 2017-10-13 MED ORDER — INFLUENZA VAC SPLIT QUAD 0.5 ML IM SUSY
0.5000 mL | PREFILLED_SYRINGE | INTRAMUSCULAR | Status: AC
  Administered 2017-10-14: 0.5 mL via INTRAMUSCULAR
  Filled 2017-10-13: qty 0.5

## 2017-10-13 NOTE — Social Work (Addendum)
CSW spoke with admission [facility], and they indicated they would need discharge summary by 4:30pm today for patient to admit to facility today.  Dr. Aundria Rudogers is in surgery.  CSW will continue to follow as [facility].  Keene BreathPatricia Justyce Baby, LCSW Clinical Social Worker [phone]

## 2017-10-13 NOTE — Progress Notes (Signed)
Orthopedics Progress Note  Subjective: Patient did great today with PT.  Passing flatus and had BM x 2.  Pain better controlled today.  Objective:  Vitals:   10/12/17 2033 10/13/17 0500  BP: (!) 148/72 138/61  Pulse: 81 81  Resp: 20 20  Temp: 98.8 F (37.1 C) (!) 97.3 F (36.3 C)  SpO2: 94% 95%    General: Awake and alert  Musculoskeletal: Dressing and TROM brace intact. NVI distally.  Calf soft and nontender.  No pain with passive stretch. Neurovascularly intact  Lab Results  Component Value Date   WBC 8.4 10/10/2017   HGB 11.5 (L) 10/10/2017   HCT 36.4 10/10/2017   MCV 91.9 10/10/2017   PLT 260 10/10/2017       Component Value Date/Time   NA 135 10/10/2017 0528   K 4.9 10/10/2017 0528   CL 97 (L) 10/10/2017 0528   CO2 30 10/10/2017 0528   GLUCOSE 218 (H) 10/10/2017 0528   BUN 7 10/10/2017 0528   CREATININE 0.85 10/10/2017 0528   CALCIUM 8.8 (L) 10/10/2017 0528   GFRNONAA >60 10/10/2017 0528   GFRAA >60 10/10/2017 0528    Lab Results  Component Value Date   INR 1.0 11/28/2016   INR 0.96 11/17/2016   INR 0.96 11/15/2016    Assessment/Plan: POD #3 s/p Procedure(s): PATELLA TENDON REPAIR Patient continues to recover and will continue OOB with therapy. D/C planning -continue PT/OT.  Likely discharge to skilled nursing facility tomorrow. [place] signed on chart. DVT prophylaxis - mechanical and lovenox Will change post op bandage to Aquacell before dc   Veronica RuedJason P Mahek Schlesinger MD 10/13/2017 9:25 PM

## 2017-10-13 NOTE — Social Work (Signed)
Clinical Social Worker facilitated patient discharge including contacting patient family and facility to confirm patient discharge plans.  Clinical information faxed to facility and family agreeable with plan.    CSW arranged ambulance transport via [facility] to [facility] .    RN to call [phone] to give report prior to discharge.  Clinical Social Worker will sign off for now as social work intervention is no longer needed. Please consult us again if new need arises.  Anouk Critzer, LCSW Clinical Social Worker [phone]    

## 2017-10-13 NOTE — Plan of Care (Signed)
  Nutrition: Adequate nutrition will be maintained 10/13/2017 1244 - Progressing by Darrow BussingArcilla, Leeandre Nordling M, RN   Pain Managment: General experience of comfort will improve 10/13/2017 1244 - Progressing by Darrow BussingArcilla, Lilyth Lawyer M, RN   Safety: Ability to remain free from injury will improve 10/13/2017 1244 - Progressing by Darrow BussingArcilla, Jackqulyn Mendel M, RN

## 2017-10-13 NOTE — Social Work (Addendum)
CSW obtained PASSR#[id number] for SNF placement.  CSW f/u with [facility] on Insurance Auth as SNF started auth on 12/17 and have not heard back from [name] providing auth yet.  CSW will continue to follow.  12:13pm: SNF advised that they received Insurance Auth for SNF placement.  CSW f/u for discharge summary.  [name], LCSW Clinical Social Worker [phone]

## 2017-10-13 NOTE — Clinical Social Work Placement (Signed)
   CLINICAL SOCIAL WORK PLACEMENT  NOTE  Date:  10/13/2017  Patient Details  Name: Veronica PateMarion A Helt MRN: [id number] Date of Birth: 03/25/1961  Clinical Social Work is seeking post-discharge placement for this patient at the Skilled  Nursing Facility level of care (*CSW will initial, date and re-position this form in  chart as items are completed):  Yes   Patient/family provided with [facility] Clinical Social Work Department's list of facilities offering this level of care within the geographic area requested by the patient (or if unable, by the patient's family).  Yes   Patient/family informed of their freedom to choose among providers that offer the needed level of care, that participate in Medicare, Medicaid or managed care program needed by the patient, have an available bed and are willing to accept the patient.  Yes   Patient/family informed of [facility]'s ownership interest in [facility] and [facility], as well as of the fact that they are under no obligation to receive care at these facilities.  PASRR submitted to EDS on       PASRR number received on 10/13/17     Existing PASRR number confirmed on       FL2 transmitted to all facilities in geographic area requested by pt/family on 10/12/17     FL2 transmitted to all facilities within larger geographic area on       Patient informed that his/her managed care company has contracts with or will negotiate with certain facilities, including the following:        Yes   Patient/family informed of bed offers received.  [facility]     Physician recommends and patient chooses bed at      [facility].  Patient to be transferred to facility by [facility]     Patient family notified on 10/13/17 of transfer.  Name of family member notified:  pt responsible for self     PHYSICIAN Please prepare priority discharge summary, including medications      Additional Comment:    _______________________________________________ Tresa MoorePatricia V Deya Bigos, LCSW 10/13/2017, 12:14 PM

## 2017-10-13 NOTE — Progress Notes (Addendum)
Physical Therapy Treatment Patient Details Name: Veronica [name] MRN: [id number] DOB: 02/08/1961 Today's Date: 10/13/2017    History of Present Illness Veronica [name] is a 56 y.o. female who complains of left knee pain following a fall; s/p ORIF LLE.  Pt has a past medical history including Anxiety, Arthritis, CAD, Depression, DM, GERD, H/O diverticulitis of colon, Heart attack (08/2009), Neuropathy, OSA, Pneumonia, and Sciatica. She has a past surgical history that includes Nasal reconstruction; Tonsillectomy and adenoidectomy; Uvulopalatopharyngoplasty; Partial colectomy; Colonoscopy; and Patellar tendon repair (Left, 10/09/2017).    PT Comments    Patient continues to make gradual progress toward mobility goals. Mod A +2 for gait. Continue to progress as tolerated with anticipated d/c to SNF for further skilled PT services.     Follow Up Recommendations  SNF;Supervision for mobility/OOB     Equipment Recommendations  Other (comment)(TBD next venue)    Recommendations for Other Services Rehab consult     Precautions / Restrictions Precautions Precautions: Fall Required Braces or Orthoses: Other Brace/Splint Other Brace/Splint: Bledsoe brace on L knee Restrictions Weight Bearing Restrictions: Yes LLE Weight Bearing: Touchdown weight bearing    Mobility  Bed Mobility Overal bed mobility: Needs Assistance Bed Mobility: Supine to Sit     Supine to sit: Min guard     General bed mobility comments: min guard for safety  Transfers Overall transfer level: Needs assistance Equipment used: Rolling walker (2 wheeled) Transfers: Sit to/from Stand Sit to Stand: Min assist         General transfer comment: assist to steady while standing; cues for hand placement  Ambulation/Gait Ambulation/Gait assistance: Mod assist;+2 safety/equipment Ambulation Distance (Feet): 7 Feet Assistive device: Rolling walker (2 wheeled) Gait Pattern/deviations: Step-to pattern      General Gait Details: cues for posture, weight shifting, sequencing; pt continues to have difficulty advancing L LE   Stairs            Wheelchair Mobility    Modified Rankin (Stroke Patients Only)       Balance Overall balance assessment: Needs assistance Sitting-balance support: Feet supported;No upper extremity supported Sitting balance-Leahy Scale: Fair     Standing balance support: Bilateral upper extremity supported Standing balance-Leahy Scale: Poor                              Cognition Arousal/Alertness: Awake/alert Behavior During Therapy: WFL for tasks assessed/performed Overall Cognitive Status: Within Functional Limits for tasks assessed                                        Exercises      General Comments        Pertinent Vitals/Pain Pain Assessment: Faces Faces Pain Scale: Hurts even more Pain Location: L LE Pain Descriptors / Indicators: Grimacing;Guarding;Sore Pain Intervention(s): Limited activity within patient's tolerance;Monitored during session;Repositioned    Home Living                      Prior Function            PT Goals (current goals can now be found in the care plan section) Progress towards PT goals: Progressing toward goals    Frequency    Min 5X/week      PT Plan Current plan remains appropriate    Co-evaluation  AM-PAC PT "6 Clicks" Daily Activity  Outcome Measure  Difficulty turning over in bed (including adjusting bedclothes, sheets and blankets)?: A Little Difficulty moving from lying on back to sitting on the side of the bed? : A Lot Difficulty sitting down on and standing up from a chair with arms (e.g., wheelchair, bedside commode, etc,.)?: Unable Help needed moving to and from a bed to chair (including a wheelchair)?: A Little Help needed walking in hospital room?: A Lot Help needed climbing 3-5 steps with a railing? : Total 6 Click Score:  12    End of Session Equipment Utilized During Treatment: Gait belt Activity Tolerance: Patient tolerated treatment well Patient left: with call bell/phone within reach;in chair Nurse Communication: Mobility status PT Visit Diagnosis: Unsteadiness on feet (R26.81);Muscle weakness (generalized) (M62.81);Pain Pain - Right/Left: Left Pain - part of body: Knee     Time: [id number] PT Time Calculation (min) (ACUTE ONLY): 28 min  Charges:  $Gait Training: 8-22 mins $Therapeutic Activity: 8-22 mins                    G Codes:       Erline LevineKellyn Reynolds Kittel, PTA Pager: [phone]     Carolynne EdouardKellyn R Tylah Mancillas 10/13/2017, 2:15 PM

## 2017-10-14 DIAGNOSIS — S82042A Displaced comminuted fracture of left patella, initial encounter for closed fracture: Secondary | ICD-10-CM | POA: Diagnosis not present

## 2017-10-14 LAB — GLUCOSE, CAPILLARY: Glucose-Capillary: 144 mg/dL — ABNORMAL HIGH (ref 65–99)

## 2017-10-14 NOTE — Progress Notes (Signed)
Report [facility] at [facility] and given to [employer] CorporationDara RN

## 2017-10-14 NOTE — Discharge Instructions (Signed)
Ortho Instructions:  - touch down weight only to the left leg - maintain knee brace at all times in full extension and locked, do not remove -- for prevention of blood clots take one 325mg  aspirin twice daily -Maintain postoperative bandage until follow-up appointment.  - return to see Dr. Aundria Rudogers in 2 weeks.

## 2017-10-14 NOTE — Plan of Care (Signed)
  Adequate for Discharge Health Behavior/Discharge Planning: Ability to manage health-related needs will improve 10/14/2017 1022 - Adequate for Discharge by Darreld Mcleanox, Aysia Lowder, RN Clinical Measurements: Ability to maintain clinical measurements within normal limits will improve 10/14/2017 1022 - Adequate for Discharge by Darreld Mcleanox, Willena Jeancharles, RN Will remain free from infection 10/14/2017 1022 - Adequate for Discharge by Darreld Mcleanox, Shandon Matson, RN Diagnostic test results will improve 10/14/2017 1022 - Adequate for Discharge by Darreld Mcleanox, Kinley Ferrentino, RN Respiratory complications will improve 10/14/2017 1022 - Adequate for Discharge by Darreld Mcleanox, Katarina Riebe, RN Cardiovascular complication will be avoided 10/14/2017 1022 - Adequate for Discharge by Darreld Mcleanox, Tosca Pletz, RN Activity: Risk for activity intolerance will decrease 10/14/2017 1022 - Adequate for Discharge by Darreld Mcleanox, Iniya Matzek, RN Coping: Level of anxiety will decrease 10/14/2017 1022 - Adequate for Discharge by Darreld Mcleanox, Marbin Olshefski, RN Pain Managment: General experience of comfort will improve 10/14/2017 1022 - Adequate for Discharge by Darreld Mcleanox, Ozell Ferrera, RN Safety: Ability to remain free from injury will improve 10/14/2017 1022 - Adequate for Discharge by Darreld Mcleanox, Bladimir Auman, RN Skin Integrity: Risk for impaired skin integrity will decrease 10/14/2017 1022 - Adequate for Discharge by Darreld Mcleanox, Elliannah Wayment, RN

## 2017-10-14 NOTE — Addendum Note (Signed)
Addendum  created 10/14/17 1335 by Achille RichHodierne, Lillyian Heidt, MD   Intraprocedure Blocks edited, Sign clinical note

## 2017-10-14 NOTE — Social Work (Signed)
Clinical Social Worker facilitated patient discharge including contacting patient family and facility to confirm patient discharge plans.  Clinical information faxed to facility and family agreeable with plan.    CSW arranged [facility].    RN to call [phone] to give report prior to discharge. Pt going to room 702P.  Clinical Social Worker will sign off for now as social work intervention is no longer needed. Please consult us again if new need arises.  Keene BreathPatricia Rudransh Bellanca, LCSW Clinical Social Worker [phone]

## 2017-10-14 NOTE — Discharge Summary (Signed)
Patient ID: Veronica [name] MRN: [id number] DOB/AGE: Jul 31, 1961 56 y.o.  Admit date: 10/09/2017 Discharge date:   Primary Diagnosis: Left comminuted patella fracture  Admission Diagnoses:  Past Medical History:  Diagnosis Date  . Anxiety   . Arthritis   . Coronary artery disease    nonobstructive by 2010 cath  . Cough   . Depression   . Diabetes mellitus   . GERD (gastroesophageal reflux disease)    years ago  . H/O diverticulitis of colon   . Heart attack ([place]) 08/2009  . Neuropathy   . OSA (obstructive sleep apnea)    pt denies   . Pneumonia   . Sciatica    Discharge Diagnoses:   Active Problems:   Displaced comminuted fracture of left patella, initial encounter for closed fracture  Estimated body mass index is 42.6 kg/m as calculated from the following:   Height as of this encounter: '5\' 5"'  (1.651 m).   Weight as of this encounter: 256 lb (116.1 kg).  Procedure:  Procedure(s) (LRB): PATELLA TENDON REPAIR (Left)   Consults: None  HPI: Veronica [name] is a 56 year old female with left comminuted patella fracture.  She sustained a fall at home.  She was evaluated in the emergency department and presented to my office for operative management recommendations. Laboratory Data: Admission on 10/09/2017  Component Date Value Ref Range Status  . Sodium 10/09/2017 137  135 - 145 mmol/L Final  . Potassium 10/09/2017 4.2  3.5 - 5.1 mmol/L Final  . Chloride 10/09/2017 103  101 - 111 mmol/L Final  . CO2 10/09/2017 26  22 - 32 mmol/L Final  . Glucose, Bld 10/09/2017 108* 65 - 99 mg/dL Final  . BUN 10/09/2017 7  6 - 20 mg/dL Final  . Creatinine, Ser 10/09/2017 0.75  0.44 - 1.00 mg/dL Final  . Calcium 10/09/2017 8.8* 8.9 - 10.3 mg/dL Final  . GFR calc non Af Amer 10/09/2017 >60  >60 mL/min Final  . GFR calc Af Amer 10/09/2017 >60  >60 mL/min Final   Comment: (NOTE) The eGFR has been calculated using the CKD EPI equation. This calculation has not been validated in all clinical  situations. eGFR's persistently <60 mL/min signify possible Chronic Kidney Disease.   . Anion gap 10/09/2017 8  5 - 15 Final  . WBC 10/09/2017 7.2  4.0 - 10.5 K/uL Final  . RBC 10/09/2017 4.02  3.87 - 5.11 MIL/uL Final  . Hemoglobin 10/09/2017 12.0  12.0 - 15.0 g/dL Final  . HCT 10/09/2017 36.4  36.0 - 46.0 % Final  . MCV 10/09/2017 90.5  78.0 - 100.0 fL Final  . MCH 10/09/2017 29.9  26.0 - 34.0 pg Final  . MCHC 10/09/2017 33.0  30.0 - 36.0 g/dL Final  . RDW 10/09/2017 12.9  11.5 - 15.5 % Final  . Platelets 10/09/2017 287  150 - 400 K/uL Final  . Glucose-Capillary 10/09/2017 118* 65 - 99 mg/dL Final  . Glucose-Capillary 10/09/2017 98  65 - 99 mg/dL Final  . Glucose-Capillary 10/09/2017 123* 65 - 99 mg/dL Final  . Comment 1 10/09/2017 Notify RN   Final  . Comment 2 10/09/2017 Document in Chart   Final  . HIV Screen 4th Generation wRfx 10/09/2017 Non Reactive  Non Reactive Final   Comment: (NOTE) Performed At: [facility] [address] [place], [place] [id number] Rush Farmer MD NW:[id number]   . WBC 10/09/2017 10.9* 4.0 - 10.5 K/uL Final  . RBC 10/09/2017 3.98  3.87 - 5.11 MIL/uL Final  .  Hemoglobin 10/09/2017 11.9* 12.0 - 15.0 g/dL Final  . HCT 10/09/2017 36.4  36.0 - 46.0 % Final  . MCV 10/09/2017 91.5  78.0 - 100.0 fL Final  . MCH 10/09/2017 29.9  26.0 - 34.0 pg Final  . MCHC 10/09/2017 32.7  30.0 - 36.0 g/dL Final  . RDW 10/09/2017 12.8  11.5 - 15.5 % Final  . Platelets 10/09/2017 274  150 - 400 K/uL Final  . Creatinine, Ser 10/09/2017 0.73  0.44 - 1.00 mg/dL Final  . GFR calc non Af Amer 10/09/2017 >60  >60 mL/min Final  . GFR calc Af Amer 10/09/2017 >60  >60 mL/min Final   Comment: (NOTE) The eGFR has been calculated using the CKD EPI equation. This calculation has not been validated in all clinical situations. eGFR's persistently <60 mL/min signify possible Chronic Kidney Disease.   . WBC 10/10/2017 8.4  4.0 - 10.5 K/uL Final  . RBC 10/10/2017 3.96  3.87  - 5.11 MIL/uL Final  . Hemoglobin 10/10/2017 11.5* 12.0 - 15.0 g/dL Final  . HCT 10/10/2017 36.4  36.0 - 46.0 % Final  . MCV 10/10/2017 91.9  78.0 - 100.0 fL Final  . MCH 10/10/2017 29.0  26.0 - 34.0 pg Final  . MCHC 10/10/2017 31.6  30.0 - 36.0 g/dL Final  . RDW 10/10/2017 13.0  11.5 - 15.5 % Final  . Platelets 10/10/2017 260  150 - 400 K/uL Final  . Sodium 10/10/2017 135  135 - 145 mmol/L Final  . Potassium 10/10/2017 4.9  3.5 - 5.1 mmol/L Final  . Chloride 10/10/2017 97* 101 - 111 mmol/L Final  . CO2 10/10/2017 30  22 - 32 mmol/L Final  . Glucose, Bld 10/10/2017 218* 65 - 99 mg/dL Final  . BUN 10/10/2017 7  6 - 20 mg/dL Final  . Creatinine, Ser 10/10/2017 0.85  0.44 - 1.00 mg/dL Final  . Calcium 10/10/2017 8.8* 8.9 - 10.3 mg/dL Final  . GFR calc non Af Amer 10/10/2017 >60  >60 mL/min Final  . GFR calc Af Amer 10/10/2017 >60  >60 mL/min Final   Comment: (NOTE) The eGFR has been calculated using the CKD EPI equation. This calculation has not been validated in all clinical situations. eGFR's persistently <60 mL/min signify possible Chronic Kidney Disease.   . Anion gap 10/10/2017 8  5 - 15 Final  . Glucose-Capillary 10/09/2017 171* 65 - 99 mg/dL Final  . Comment 1 10/09/2017 Document in Chart   Final  . Glucose-Capillary 10/10/2017 208* 65 - 99 mg/dL Final  . Glucose-Capillary 10/10/2017 258* 65 - 99 mg/dL Final  . Glucose-Capillary 10/10/2017 235* 65 - 99 mg/dL Final  . Glucose-Capillary 10/10/2017 217* 65 - 99 mg/dL Final  . Comment 1 10/10/2017 Document in Chart   Final  . Glucose-Capillary 10/11/2017 149* 65 - 99 mg/dL Final  . Comment 1 10/11/2017 Document in Chart   Final  . Glucose-Capillary 10/11/2017 210* 65 - 99 mg/dL Final  . Glucose-Capillary 10/11/2017 203* 65 - 99 mg/dL Final  . Glucose-Capillary 10/11/2017 259* 65 - 99 mg/dL Final  . Glucose-Capillary 10/12/2017 171* 65 - 99 mg/dL Final  . Glucose-Capillary 10/12/2017 137* 65 - 99 mg/dL Final  . Glucose-Capillary  10/12/2017 264* 65 - 99 mg/dL Final  . Glucose-Capillary 10/12/2017 163* 65 - 99 mg/dL Final  . Glucose-Capillary 10/13/2017 97  65 - 99 mg/dL Final  . Glucose-Capillary 10/13/2017 200* 65 - 99 mg/dL Final  . Glucose-Capillary 10/13/2017 191* 65 - 99 mg/dL Final  . Edmonia Lynch 10/13/2017  210* 65 - 99 mg/dL Final  . Glucose-Capillary 10/14/2017 144* 65 - 99 mg/dL Final     X-Rays:Dg Knee 1-2 Views Left  Result Date: 09/30/2017 CLINICAL DATA:  Status post fall.  Knee pain. EXAM: LEFT KNEE - 1-2 VIEW COMPARISON:  None. FINDINGS: Severely comminuted fracture of the mid and inferior pole of the patella with 16 mm of distraction between the major fracture fragments and 90 degrees of rotation of the inferior major fracture fragment. No other fracture or dislocation. IMPRESSION: 1. Severely comminuted fracture of the mid and inferior pole of the patella with 16 mm of distraction between the major fracture fragments and 90 degrees of rotation of the inferior major fracture fragment. Electronically Signed   By: Kathreen Devoid   On: 09/30/2017 13:18   Dg Knee Left Port  Result Date: 10/09/2017 CLINICAL DATA:  Displaced comminuted left patellar fracture. Postop. EXAM: PORTABLE LEFT KNEE - 1-2 VIEW COMPARISON:  Preoperative radiographs 09/30/2017 FINDINGS: Post resection of the inferior patella for comminuted fracture. No new fracture. Recent postsurgical change includes soft tissue air and edema and anterior skin staples. IMPRESSION: Post inferior patellar resection for comminuted fracture. Electronically Signed   By: Jeb Levering M.D.   On: 10/09/2017 19:32    EKG: Orders placed or performed during the hospital encounter of 03/31/17  . ED EKG within 10 minutes  . ED EKG within 10 minutes  . ED EKG  . ED EKG  . Repeat EKG  . Repeat EKG  . EKG     Hospital Course: MEL TADROS is a 56 y.o. who was admitted to Hospital. They were brought to the operating room on 10/09/2017 and underwent  Procedure(s): [place].  Patient tolerated the procedure well and was later transferred to the recovery room and then to the orthopaedic floor for postoperative care.  They were given PO and IV analgesics for pain control following their surgery.  They were given 24 hours of postoperative antibiotics of  Anti-infectives (From admission, onward)   Start     Dose/Rate Route Frequency Ordered Stop   10/10/17 0600  ceFAZolin (ANCEF) IVPB 2g/100 mL premix     2 g 200 mL/hr over 30 Minutes Intravenous On call to O.R. 10/09/17 1224 10/09/17 1517     and started on DVT prophylaxis in the form of Lovenox.   PT and OT were ordered for dispositional planning.  Discharge planning consulted to help with postop disposition and equipment needs.  Patient had a fair night on the evening of surgery.  They started to get up OOB with therapy on day one.   Continued to work with therapy into day two.  By day three, the patient had progressed with therapy and meeting their goals.  Incision was healing well.  Patient was seen in rounds and was ready to discharge to a skilled nursing facility on postoperative day #4.   Diet: Diabetic diet Activity:NWB Follow-up:in 2 weeks Disposition - Skilled nursing facility Discharged Condition: good   Discharge Instructions    Call MD / Call 911   Complete by:  As directed    If you experience chest pain or shortness of breath, CALL 911 and be transported to the hospital emergency room.  If you develope a fever above 101 F, pus (white drainage) or increased drainage or redness at the wound, or calf pain, call your surgeon's office.   Constipation Prevention   Complete by:  As directed    Drink plenty of fluids.  Prune juice may be helpful.  You may use a stool softener, such as Colace (over the counter) 100 mg twice a day.  Use MiraLax (over the counter) for constipation as needed.   Diet - low sodium heart healthy   Complete by:  As directed    Increase activity  slowly as tolerated   Complete by:  As directed      Allergies as of 10/14/2017   No Known Allergies     Medication List    TAKE these medications   acetaminophen-codeine 300-60 MG tablet Commonly known as:  TYLENOL #4 Take 1 tablet by mouth 2 (two) times daily as needed for moderate pain or severe pain.   aspirin EC 325 MG tablet Take 1 tablet (325 mg total) by mouth 2 (two) times daily.   clonazePAM 1 MG tablet Commonly known as:  KLONOPIN Take 1 mg by mouth 2 (two) times daily as needed for anxiety (sleep).   FLUoxetine 10 MG capsule Commonly known as:  PROZAC Take 20 mg by mouth daily.   gabapentin 400 MG capsule Commonly known as:  NEURONTIN Take 1 capsule (400 mg total) by mouth 4 (four) times daily.   hydrochlorothiazide 12.5 MG tablet Commonly known as:  HYDRODIURIL Take 12.5 mg by mouth daily as needed.   HYDROcodone-acetaminophen 5-325 MG tablet Commonly known as:  NORCO/VICODIN Take 1 tablet by mouth every 4 (four) hours as needed for moderate pain.   insulin NPH Human 100 UNIT/ML injection Commonly known as:  HUMULIN N,NOVOLIN N Inject 30 Units into the skin 2 (two) times daily before a meal.   insulin regular 100 units/mL injection Commonly known as:  NOVOLIN R,HUMULIN R Inject 5-15 Units into the skin 3 (three) times daily before meals. Per sliding scales   ondansetron 8 MG disintegrating tablet Commonly known as:  ZOFRAN-ODT Take 1 tablet (8 mg total) by mouth every 8 (eight) hours as needed for nausea.   oxyCODONE-acetaminophen 7.5-325 MG tablet Commonly known as:  PERCOCET Take 1-2 tablets by mouth every 6 (six) hours as needed for severe pain.   traMADol 50 MG tablet Commonly known as:  ULTRAM Take 2 tablets (100 mg total) by mouth 2 (two) times daily.       Contact information for follow-up providers    Nicholes Stairs, MD. Schedule an appointment as soon as possible for a visit in 2 weeks.   Specialty:  Orthopedic Surgery Why:   For wound re-check, For suture removal Contact information: [address] [place] [address] [phone]            Contact information for after-discharge care    Destination    [facility] .   Service:  Skilled Nursing Contact information: [place] [phone]                  Signed: Geralynn Rile, MD Orthopaedic Surgery 10/14/2017, 8:18 AM

## 2017-10-26 ENCOUNTER — Encounter (HOSPITAL_COMMUNITY): Payer: Self-pay | Admitting: Orthopedic Surgery

## 2018-05-12 ENCOUNTER — Ambulatory Visit
Admission: RE | Admit: 2018-05-12 | Discharge: 2018-05-12 | Disposition: A | Discharge status: Home / Self Care | Payer: BLUE CROSS/BLUE SHIELD | Source: Ambulatory Visit | Attending: Family Medicine | Admitting: Family Medicine

## 2018-05-12 ENCOUNTER — Other Ambulatory Visit: Payer: Self-pay | Admitting: Family Medicine

## 2018-05-12 DIAGNOSIS — M79641 Pain in right hand: Secondary | ICD-10-CM

## 2018-05-12 DIAGNOSIS — M79642 Pain in left hand: Principal | ICD-10-CM

## 2018-12-08 IMAGING — MR MR HEAD WO/W CM
10 of 13 series · 34 of 48 positions shown · IV contrast (multihance)
Comparison: None.

CLINICAL DATA: Inflow un is a infection. Severe headache beginning
today. Treated with IVIG. Assess for meningitis

EXAM:
MRI HEAD WITHOUT AND WITH CONTRAST
TECHNIQUE: Multiplanar, multiecho pulse sequences of the brain and surrounding
structures were obtained without and with intravenous contrast.
CONTRAST:  20mL MULTIHANCE GADOBENATE DIMEGLUMINE 529 MG/ML IV SOLN

[Series 3: T1 · sagittal · 5.0mm · 0.47mm/px · 1 of 24 slices shown]
[im 1/24]
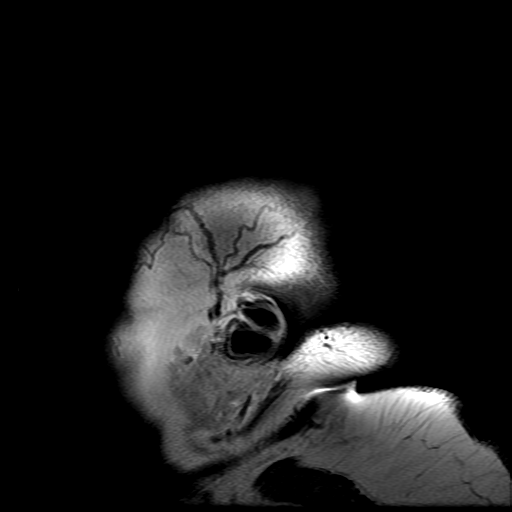

[Series 4: DWI · axial · 3.0mm · 1.09mm/px · z∈[-46,+100]mm · 9 of 100 slices shown (1 of 4)]
[im 1/100]
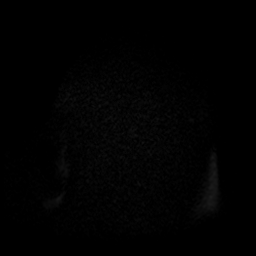
[im 13/100]
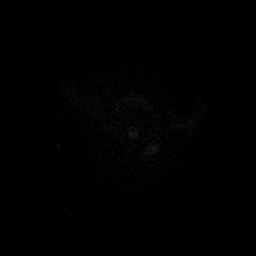
[im 25/100]
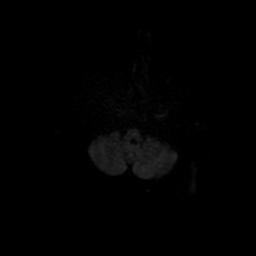
[im 38/100]
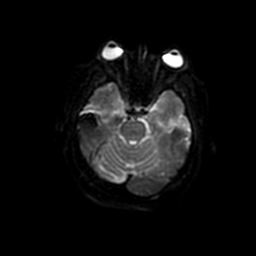
[im 50/100]
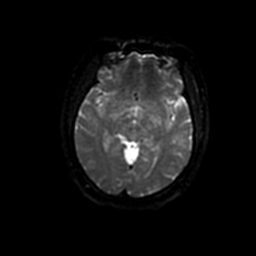
[im 62/100]
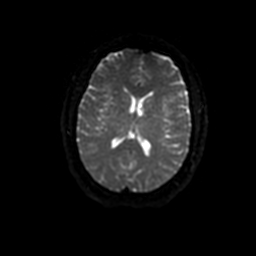
[im 75/100]
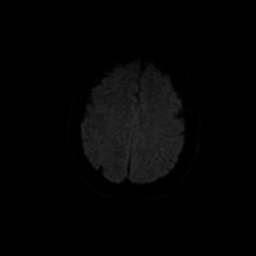
[im 87/100]
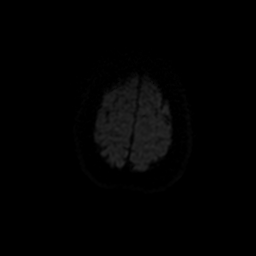
[im 100/100]
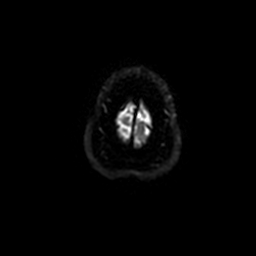

[Series 5: DWI · coronal · 5.0mm · 1.09mm/px · 5 of 60 slices shown (2 of 4)]
[im 1/60]
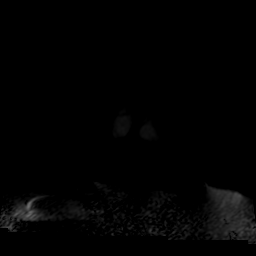
[im 15/60]
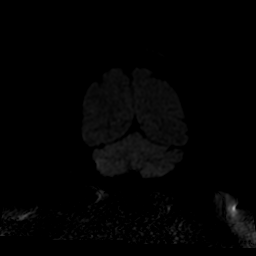
[im 30/60]
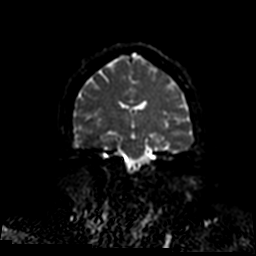
[im 45/60]
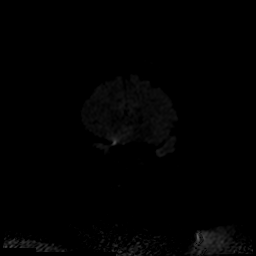
[im 60/60]
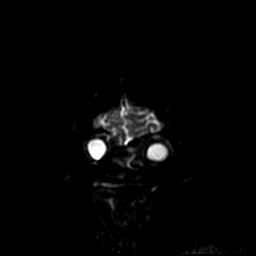

[Series 7: T2 · axial · 5.0mm · 0.43mm/px · z∈[-56,+103]mm · 3 of 28 slices shown (1 of 2)]
[im 1/28]
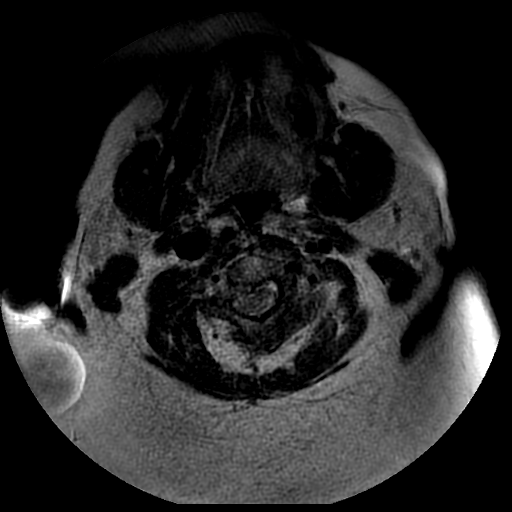
[im 14/28]
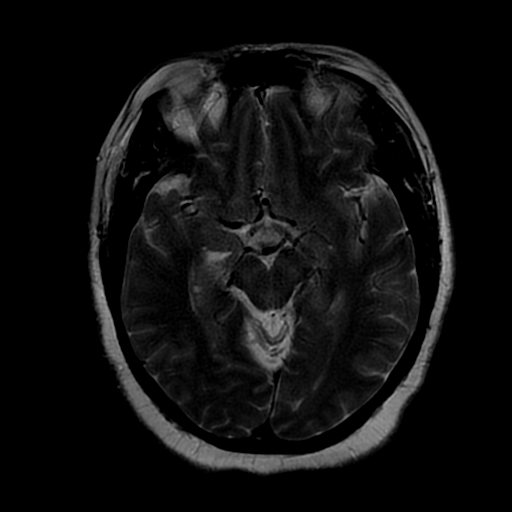
[im 28/28]
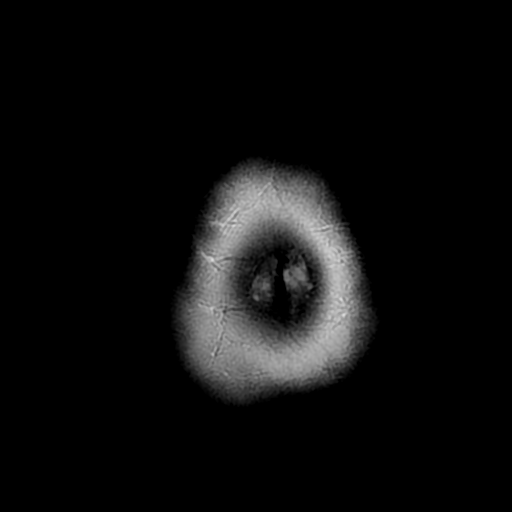

[Series 8: FLAIR · axial · 5.0mm · 0.43mm/px · z∈[-56,+103]mm · 3 of 28 slices shown]
[im 1/28]
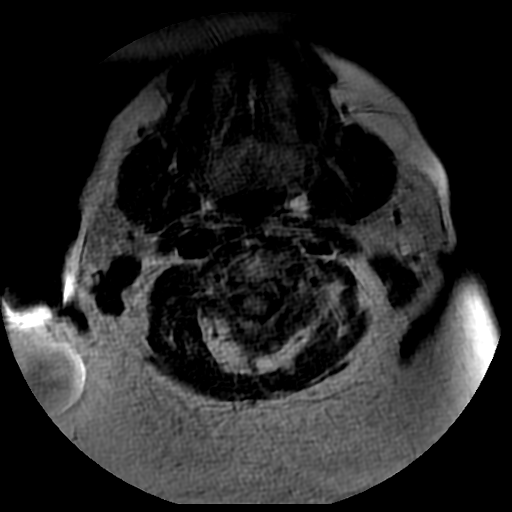
[im 14/28]
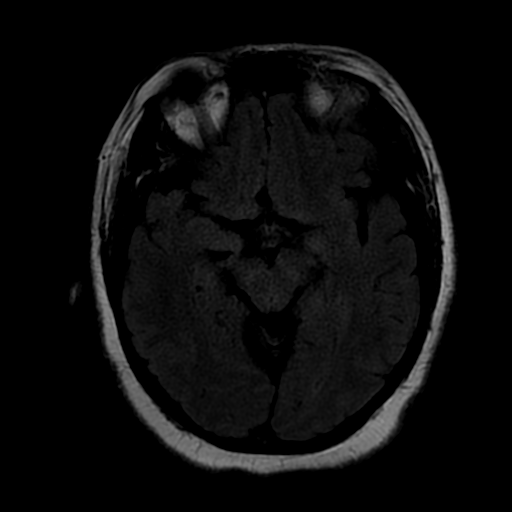
[im 28/28]
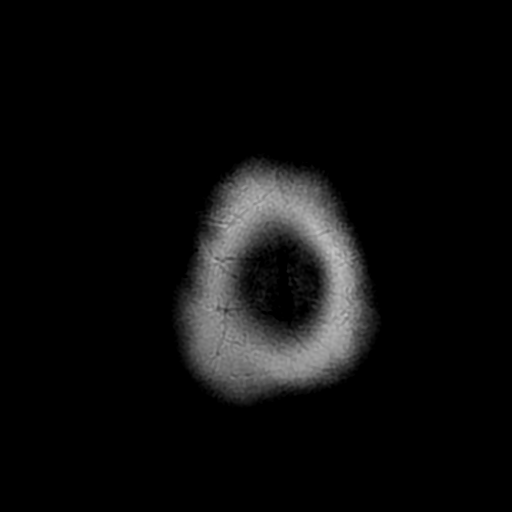

[Series 10: T2 · coronal · 5.0mm · 0.47mm/px · 2 of 24 slices shown (2 of 2)]
[im 1/24]
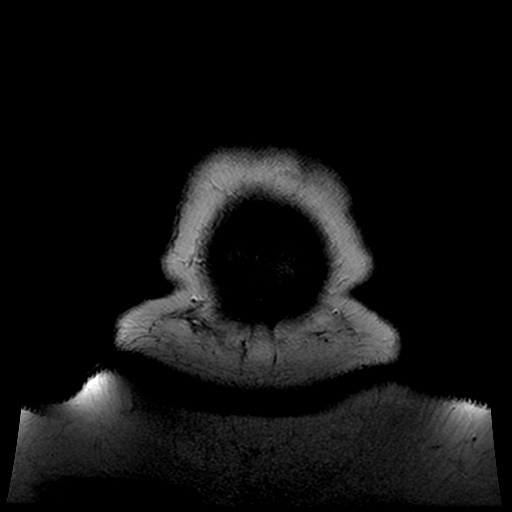
[im 24/24]
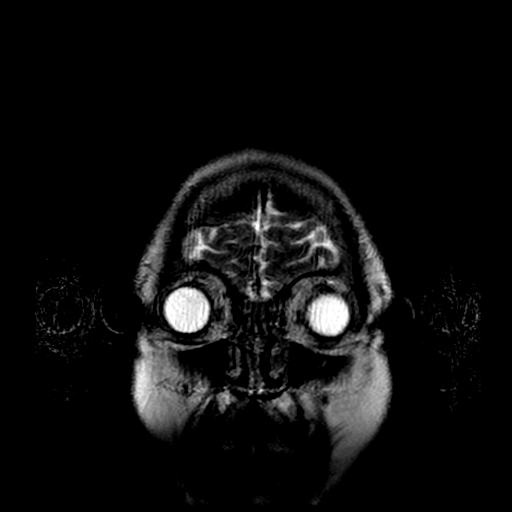

[Series 12: T1 post-contrast · coronal · 5.0mm · 0.47mm/px · 2 of 24 slices shown (1 of 2)]
[im 1/24]
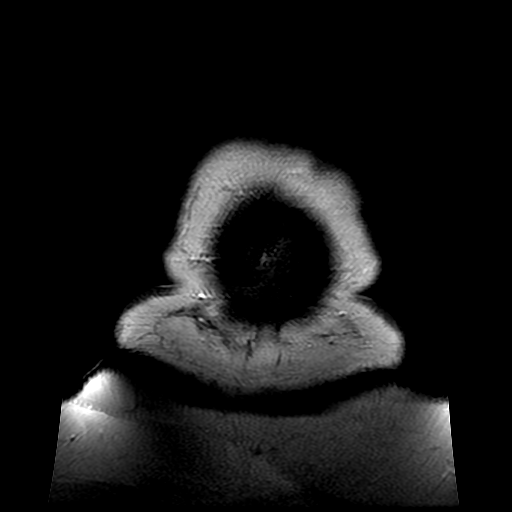
[im 24/24]
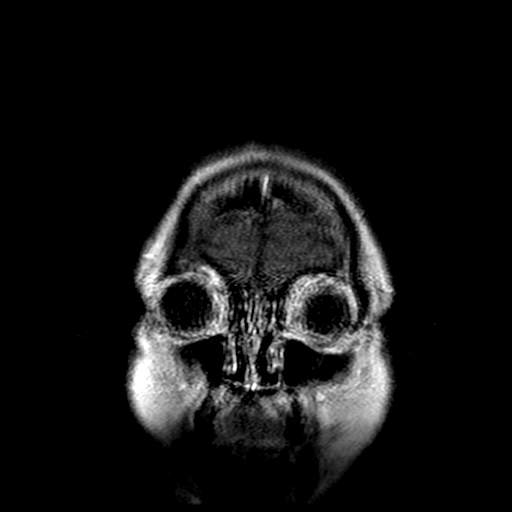

[Series 13: T1 post-contrast · sagittal · 5.0mm · 0.47mm/px · 2 of 24 slices shown (2 of 2)]
[im 1/24]
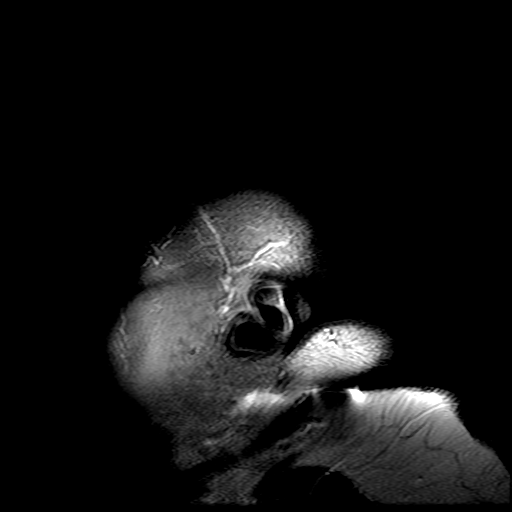
[im 24/24]
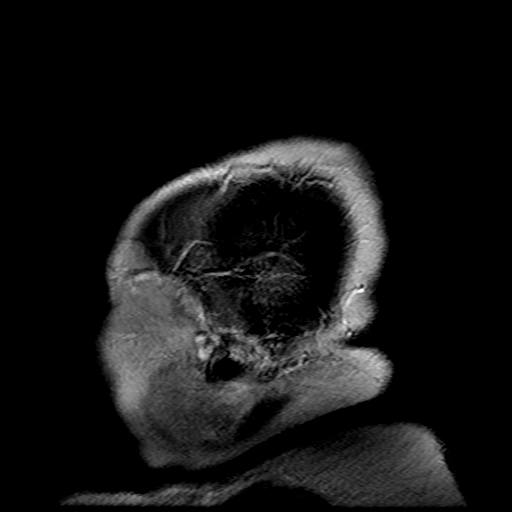

[Series 400: DWI · axial · 3.0mm · 1.09mm/px · z∈[-46,+100]mm · 4 of 50 slices shown (3 of 4)]
[im 1/50]
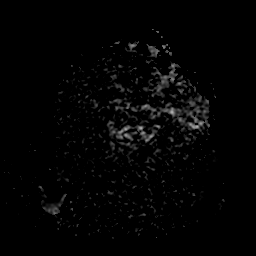
[im 17/50]
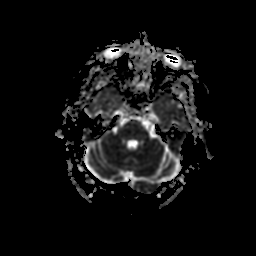
[im 33/50]
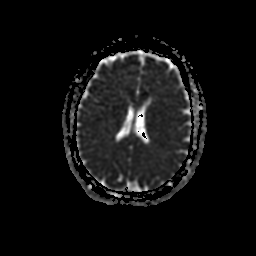
[im 50/50]
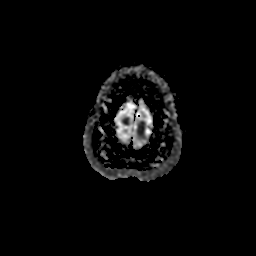

[Series 500: DWI · coronal · 5.0mm · 1.09mm/px · 3 of 30 slices shown (4 of 4)]
[im 1/30]
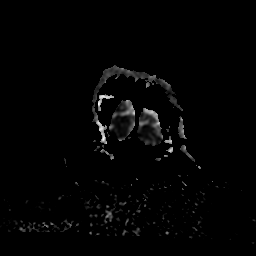
[im 15/30]
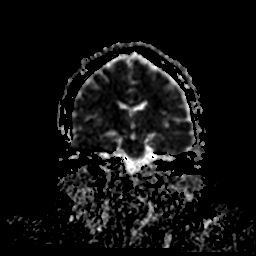
[im 30/30]
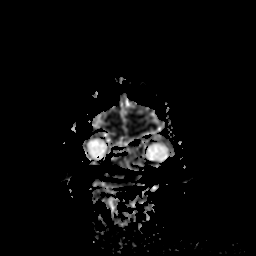

[34 of 48 positions shown; findings below may reference images not displayed]

FINDINGS: Brain: The brain has normal appearance without evidence of
malformation, atrophy, old or acute small or large vessel
infarction, hemorrhage, hydrocephalus or extra-axial collection. No
pituitary abnormality. After contrast administration, no abnormal
enhancement occurs.

Vascular: Major vessels at the base of the brain show flow.

Skull and upper cervical spine: Normal

Sinuses/Orbits: Clear/ normal.

Other: None significant.
IMPRESSION: Normal examination. No sign of meningitis or other intracranial
pathology by MRI.

## 2019-02-25 ENCOUNTER — Emergency Department (HOSPITAL_COMMUNITY)
Admission: EM | Admit: 2019-02-25 | Discharge: 2019-02-25 | Disposition: A | Discharge status: Home / Self Care | Payer: Medicaid Other | Attending: Emergency Medicine | Admitting: Emergency Medicine

## 2019-02-25 ENCOUNTER — Emergency Department (HOSPITAL_COMMUNITY): Payer: Medicaid Other

## 2019-02-25 ENCOUNTER — Other Ambulatory Visit: Payer: Self-pay

## 2019-02-25 ENCOUNTER — Encounter (HOSPITAL_COMMUNITY): Payer: Self-pay

## 2019-02-25 DIAGNOSIS — Z794 Long term (current) use of insulin: Secondary | ICD-10-CM | POA: Diagnosis not present

## 2019-02-25 DIAGNOSIS — G4733 Obstructive sleep apnea (adult) (pediatric): Secondary | ICD-10-CM | POA: Insufficient documentation

## 2019-02-25 DIAGNOSIS — E1142 Type 2 diabetes mellitus with diabetic polyneuropathy: Secondary | ICD-10-CM | POA: Diagnosis not present

## 2019-02-25 DIAGNOSIS — I2510 Atherosclerotic heart disease of native coronary artery without angina pectoris: Secondary | ICD-10-CM | POA: Insufficient documentation

## 2019-02-25 DIAGNOSIS — Z87891 Personal history of nicotine dependence: Secondary | ICD-10-CM | POA: Insufficient documentation

## 2019-02-25 DIAGNOSIS — Z79899 Other long term (current) drug therapy: Secondary | ICD-10-CM | POA: Diagnosis not present

## 2019-02-25 DIAGNOSIS — Z20828 Contact with and (suspected) exposure to other viral communicable diseases: Secondary | ICD-10-CM | POA: Diagnosis not present

## 2019-02-25 DIAGNOSIS — R531 Weakness: Secondary | ICD-10-CM | POA: Diagnosis not present

## 2019-02-25 DIAGNOSIS — Z7982 Long term (current) use of aspirin: Secondary | ICD-10-CM | POA: Diagnosis not present

## 2019-02-25 DIAGNOSIS — I252 Old myocardial infarction: Secondary | ICD-10-CM | POA: Diagnosis not present

## 2019-02-25 DIAGNOSIS — M791 Myalgia, unspecified site: Secondary | ICD-10-CM | POA: Insufficient documentation

## 2019-02-25 DIAGNOSIS — R52 Pain, unspecified: Secondary | ICD-10-CM

## 2019-02-25 DIAGNOSIS — R6889 Other general symptoms and signs: Secondary | ICD-10-CM

## 2019-02-25 LAB — COMPREHENSIVE METABOLIC PANEL
ALT: 32 U/L (ref 0–44)
AST: 31 U/L (ref 15–41)
Albumin: 3.8 g/dL (ref 3.5–5.0)
Alkaline Phosphatase: 78 U/L (ref 38–126)
Anion gap: 8 (ref 5–15)
BUN: 11 mg/dL (ref 6–20)
CO2: 31 mmol/L (ref 22–32)
Calcium: 8.9 mg/dL (ref 8.9–10.3)
Chloride: 102 mmol/L (ref 98–111)
Creatinine, Ser: 0.94 mg/dL (ref 0.44–1.00)
GFR calc Af Amer: >60 mL/min (ref >60)
GFR calc non Af Amer: >60 mL/min (ref >60)
Glucose, Bld: 149 mg/dL — ABNORMAL HIGH (ref 70–99)
Potassium: 5.1 mmol/L (ref 3.5–5.1)
Sodium: 141 mmol/L (ref 135–145)
Total Bilirubin: 0.6 mg/dL (ref 0.3–1.2)
Total Protein: 7.2 g/dL (ref 6.5–8.1)

## 2019-02-25 LAB — URINALYSIS, ROUTINE W REFLEX MICROSCOPIC
Bilirubin Urine: NEGATIVE
Glucose, UA: NEGATIVE mg/dL
Hgb urine dipstick: NEGATIVE
Ketones, ur: NEGATIVE mg/dL
Leukocytes,Ua: NEGATIVE
Nitrite: NEGATIVE
Protein, ur: NEGATIVE mg/dL
Specific Gravity, Urine: 1.006 (ref 1.005–1.030)
pH: 6 (ref 5.0–8.0)

## 2019-02-25 LAB — CBC WITH DIFFERENTIAL/PLATELET
Abs Immature Granulocytes: 0.01 10*3/uL (ref 0.00–0.07)
Basophils Absolute: 0 10*3/uL (ref 0.0–0.1)
Basophils Relative: 0 %
Eosinophils Absolute: 0 10*3/uL (ref 0.0–0.5)
Eosinophils Relative: 1 %
HCT: 41.8 % (ref 36.0–46.0)
Hemoglobin: 13.1 g/dL (ref 12.0–15.0)
Immature Granulocytes: 0 %
Lymphocytes Relative: 16 %
Lymphs Abs: 0.8 10*3/uL (ref 0.7–4.0)
MCH: 29.9 pg (ref 26.0–34.0)
MCHC: 31.3 g/dL (ref 30.0–36.0)
MCV: 95.4 fL (ref 80.0–100.0)
Monocytes Absolute: 1.1 10*3/uL — ABNORMAL HIGH (ref 0.1–1.0)
Monocytes Relative: 21 %
Neutro Abs: 3.2 10*3/uL (ref 1.7–7.7)
Neutrophils Relative %: 62 %
Platelets: 188 10*3/uL (ref 150–400)
RBC: 4.38 MIL/uL (ref 3.87–5.11)
RDW: 12.7 % (ref 11.5–15.5)
WBC: 5.1 10*3/uL (ref 4.0–10.5)
nRBC: 0 % (ref 0.0–0.2)

## 2019-02-25 MED ORDER — SODIUM CHLORIDE 0.9 % IV BOLUS
1000.0000 mL | Freq: Once | INTRAVENOUS | Status: AC
  Administered 2019-02-25: 13:00:00 1000 mL via INTRAVENOUS

## 2019-02-25 MED ORDER — HYDROCODONE-ACETAMINOPHEN 5-325 MG PO TABS
1.0000 | ORAL_TABLET | Freq: Once | ORAL | Status: AC
  Administered 2019-02-25: 1 via ORAL
  Filled 2019-02-25: qty 1

## 2019-02-25 NOTE — ED Triage Notes (Addendum)
Pt arrived via EMS from [facility]. Pt is c/o generalized pain and states that she believes it to be from her fibromyalgia flare-up. Per facility pt to be evaluated as she had some chest pain last night in which was relieved when the pt belched. Pt was tested negative for Covid-19 yesterday but resides in a wing in which houses many covid + residence.  **Pt presents with a fever axiliary temp> 100.1. Pt refuses oral temp at this time of assessment   **Pt reports the passing of cousin.    EMS 106/66 HR 100, RR 20, 94% RA, CBG 151.

## 2019-02-25 NOTE — ED Notes (Signed)
[facility] called  

## 2019-02-25 NOTE — ED Notes (Signed)
[facility] picked up patient RN attempted to call [facility] to give report x 3 no answer.

## 2019-02-25 NOTE — ED Notes (Signed)
[facility] called 

## 2019-02-25 NOTE — ED Notes (Signed)
Bed: EL38 Expected date:  Expected time:  Means of arrival:  Comments: EMS-body aches/Covid neg

## 2019-02-25 NOTE — Discharge Instructions (Addendum)
You are seen in the ER for body aches. Basic lab work-up in the ER is normal. Chest x-ray does not reveal any concerning findings.  We have tested you for COVID-19.  Expect the results to come back in 2 days.  It appears that you have already started maintaining good quarantine. We recommend that you continue with those efforts.  Turn to the ER immediately if you start having severe shortness of breath.     Person Under Monitoring Name: Veronica [name]  Location: [address] [place] [address]   Infection Prevention Recommendations for Individuals Confirmed to have, or Being Evaluated for, 2019 Novel Coronavirus (COVID-19) Infection Who Receive Care at Home  Individuals who are confirmed to have, or are being evaluated for, COVID-19 should follow the prevention steps below until a healthcare provider or local or state health department says they can return to normal activities.  Stay home except to get medical care You should restrict activities outside your home, except for getting medical care. Do not go to work, school, or public areas, and do not use public transportation or taxis.  Call ahead before visiting your doctor Before your medical appointment, call the healthcare provider and tell them that you have, or are being evaluated for, COVID-19 infection. This will help the healthcare providers office take steps to keep other people from getting infected. Ask your healthcare provider to call the local or state health department.  Monitor your symptoms Seek prompt medical attention if your illness is worsening (e.g., difficulty breathing). Before going to your medical appointment, call the healthcare provider and tell them that you have, or are being evaluated for, COVID-19 infection. Ask your healthcare provider to call the local or state health department.  Wear a facemask You should wear a facemask that covers your nose and mouth when you are in the  same room with other people and when you visit a healthcare provider. People who live with or visit you should also wear a facemask while they are in the same room with you.  Separate yourself from other people in your home As much as possible, you should stay in a different room from other people in your home. Also, you should use a separate bathroom, if available.  Avoid sharing household items You should not share dishes, drinking glasses, cups, eating utensils, towels, bedding, or other items with other people in your home. After using these items, you should wash them thoroughly with soap and water.  Cover your coughs and sneezes Cover your mouth and nose with a tissue when you cough or sneeze, or you can cough or sneeze into your sleeve. Throw used tissues in a lined trash can, and immediately wash your hands with soap and water for at least 20 seconds or use an alcohol-based hand rub.  Wash your [employer] your hands often and thoroughly with soap and water for at least 20 seconds. You can use an alcohol-based hand sanitizer if soap and water are not available and if your hands are not visibly dirty. Avoid touching your eyes, nose, and mouth with unwashed hands.   Prevention Steps for Caregivers and Household Members of Individuals Confirmed to have, or Being Evaluated for, COVID-19 Infection Being Cared for in the Home  If you live with, or provide care at home for, a person confirmed to have, or being evaluated for, COVID-19 infection please follow these guidelines to prevent infection:  Follow healthcare providers instructions Make sure that you understand  and can help the patient follow any healthcare provider instructions for all care.  Provide for the patients basic needs You should help the patient with basic needs in the home and provide support for getting groceries, prescriptions, and other personal needs.  Monitor the patients symptoms If they are getting  sicker, call his or her medical provider and tell them that the patient has, or is being evaluated for, COVID-19 infection. This will help the healthcare providers office take steps to keep other people from getting infected. Ask the healthcare provider to call the local or state health department.  Limit the number of people who have contact with the patient If possible, have only one caregiver for the patient. Other household members should stay in another home or place of residence. If this is not possible, they should stay in another room, or be separated from the patient as much as possible. Use a separate bathroom, if available. Restrict visitors who do not have an essential need to be in the home.  Keep older adults, very young children, and other sick people away from the patient Keep older adults, very young children, and those who have compromised immune systems or chronic health conditions away from the patient. This includes people with chronic heart, lung, or kidney conditions, diabetes, and cancer.  Ensure good ventilation Make sure that shared spaces in the home have good air flow, such as from an air conditioner or an opened window, weather permitting.  Wash your hands often Wash your hands often and thoroughly with soap and water for at least 20 seconds. You can use an alcohol based hand sanitizer if soap and water are not available and if your hands are not visibly dirty. Avoid touching your eyes, nose, and mouth with unwashed hands. Use disposable paper towels to dry your hands. If not available, use dedicated cloth towels and replace them when they become wet.  Wear a facemask and gloves Wear a disposable facemask at all times in the room and gloves when you touch or have contact with the patients blood, body fluids, and/or secretions or excretions, such as sweat, saliva, sputum, nasal mucus, vomit, urine, or feces.  Ensure the mask fits over your nose and mouth tightly,  and do not touch it during use. Throw out disposable facemasks and gloves after using them. Do not reuse. Wash your hands immediately after removing your facemask and gloves. If your personal clothing becomes contaminated, carefully remove clothing and launder. Wash your hands after handling contaminated clothing. Place all used disposable facemasks, gloves, and other waste in a lined container before disposing them with other household waste. Remove gloves and wash your hands immediately after handling these items.  Do not share dishes, glasses, or other household items with the patient Avoid sharing household items. You should not share dishes, drinking glasses, cups, eating utensils, towels, bedding, or other items with a patient who is confirmed to have, or being evaluated for, COVID-19 infection. After the person uses these items, you should wash them thoroughly with soap and water.  Wash laundry thoroughly Immediately remove and wash clothes or bedding that have blood, body fluids, and/or secretions or excretions, such as sweat, saliva, sputum, nasal mucus, vomit, urine, or feces, on them. Wear gloves when handling laundry from the patient. Read and follow directions on labels of laundry or clothing items and detergent. In general, wash and dry with the warmest temperatures recommended on the label.  Clean all areas the individual has used often Clean  all touchable surfaces, such as counters, tabletops, doorknobs, bathroom fixtures, toilets, phones, keyboards, tablets, and bedside tables, every day. Also, clean any surfaces that may have blood, body fluids, and/or secretions or excretions on them. Wear gloves when cleaning surfaces the patient has come in contact with. Use a diluted bleach solution (e.g., dilute bleach with 1 part bleach and 10 parts water) or a household disinfectant with a label that says EPA-registered for coronaviruses. To make a bleach solution at home, add 1 tablespoon  of bleach to 1 quart (4 cups) of water. For a larger supply, add  cup of bleach to 1 gallon (16 cups) of water. Read labels of cleaning products and follow recommendations provided on product labels. Labels contain instructions for safe and effective use of the cleaning product including precautions you should take when applying the product, such as wearing gloves or eye protection and making sure you have good ventilation during use of the product. Remove gloves and wash hands immediately after cleaning.  Monitor yourself for signs and symptoms of illness Caregivers and household members are considered close contacts, should monitor their health, and will be asked to limit movement outside of the home to the extent possible. Follow the monitoring steps for close contacts listed on the symptom monitoring form.   ? If you have additional questions, contact your local health department or call the epidemiologist on call at [phone] (available 24/7). ? This guidance is subject to change. For the most up-to-date guidance from [facility], please refer to their website: TripMetro.huhttps://www.cdc.gov/coronavirus/2019-ncov/hcp/guidance-prevent-spread.html

## 2019-02-25 NOTE — ED Provider Notes (Signed)
[facility]-EMERGENCY DEPT Provider Note   CSN: [id number] Arrival date & time: 02/25/19  1128    History   Chief Complaint Chief Complaint  Patient presents with  . Generalized Body Aches    HPI Veronica [name] is a 58 y.o. female.     HPI  58 year old female comes in with chief complaint of body aches.  Patient has history of CAD, knee replacement surgery and resides at a nursing home.  Patient states that there was a COVID-19 outbreak at her nursing facility and about 17 residents were tested positive.  She tested negative.  However over the past 2 days she is started developing body aches.  The last time she had similar symptoms was when she developed a flu in 2018.  In addition to the body aches she is having some weakness but she denies any chest pain, cough, fevers, chills, nausea, vomiting.  Past Medical History:  Diagnosis Date  . Anxiety   . Arthritis   . Coronary artery disease    nonobstructive by 2010 cath  . Cough   . Depression   . Diabetes mellitus   . GERD (gastroesophageal reflux disease)    years ago  . H/O diverticulitis of colon   . Heart attack (HCC) 08/2009  . Neuropathy   . OSA (obstructive sleep apnea)    pt denies   . Pneumonia   . Sciatica     Patient Active Problem List   Diagnosis Date Noted  . Displaced comminuted fracture of left patella, initial encounter for closed fracture 10/09/2017  . H/O diverticulitis of colon   . Lumbar degenerative disc disease 02/02/2017  . Fibromyalgia syndrome 02/02/2017  . Hyponatremia 11/14/2016  . Research subject 11/14/2016  . Hypoxia 11/13/2016  . Respiratory failure with hypoxia (HCC) 11/13/2016  . SIRS (systemic inflammatory response syndrome) (HCC) 11/13/2016  . Influenza A 11/13/2016  . Anxiety and depression 11/13/2016  . Thyroid nodule 11/13/2016  . Diabetic polyneuropathy associated with type 2 diabetes mellitus (HCC) 08/15/2016  . Chronic midline low back pain  08/15/2016  . Lumbar facet arthropathy 08/15/2016  . Cough 08/15/2015  . OSA (obstructive sleep apnea) 08/15/2015  . Sciatica 08/15/2015  . Diabetes mellitus (HCC) 08/15/2015  . Hiatal hernia 08/15/2015  . GERD (gastroesophageal reflux disease) 08/15/2015    Past Surgical History:  Procedure Laterality Date  . COLONOSCOPY    . NASAL RECONSTRUCTION    . ORIF PATELLA Left 10/09/2017   Procedure: PATELLA TENDON REPAIR;  Surgeon: Yolonda Kida, MD;  Location: [facility] OR;  Service: Orthopedics;  Laterality: Left;  120 MINS  . PARTIAL COLECTOMY    . PATELLAR TENDON REPAIR Left 10/09/2017  . TONSILLECTOMY AND ADENOIDECTOMY    . UVULOPALATOPHARYNGOPLASTY       OB History   No obstetric history on file.      Home Medications    Prior to Admission medications   Medication Sig Start Date End Date Taking? Authorizing Provider  albuterol (VENTOLIN HFA) 108 (90 Base) MCG/ACT inhaler Inhale 2 puffs into the lungs every 4 (four) hours as needed for wheezing or shortness of breath.   Yes [provider]  aspirin 81 MG chewable tablet Chew 81 mg by mouth daily.   Yes [provider]  clonazePAM (KLONOPIN) 0.5 MG tablet Take 0.25 mg by mouth at bedtime. Give with  at bedtime   Yes [provider]  clonazePAM (KLONOPIN) 0.5 MG tablet Take 0.125 mg by mouth daily.   Yes  [provider]  clonazePAM (KLONOPIN) 1 MG tablet Take 1 mg by mouth at bedtime.  10/01/16  Yes [provider]  cyclobenzaprine (FLEXERIL) 10 MG tablet Take 10 mg by mouth daily as needed for muscle spasms.   Yes [provider]  DULoxetine (CYMBALTA) 60 MG capsule Take 60 mg by mouth daily.   Yes [provider]  furosemide (LASIX) 20 MG tablet Take 20 mg by mouth daily.   Yes [provider]  gabapentin (NEURONTIN) 100 MG capsule Take 200 mg by mouth 2 (two) times daily.   Yes [provider]  insulin lispro (HUMALOG) 100 UNIT/ML injection  Inject 2-12 Units into the skin 3 (three) times daily before meals. 0-200 Zero units 201-250 Two units 251-300 Four units 301-350 Six units 351-400 Eight units 401-450 Ten units 451-500 Twelve units 501+ call MD   Yes [provider]  insulin NPH Human (HUMULIN N,NOVOLIN N) 100 UNIT/ML injection Inject 27 Units into the skin 2 (two) times daily before a meal.  11/27/16  Yes [provider]  Melatonin 10 MG TABS Take 1 tablet by mouth at bedtime.   Yes [provider]  Menthol, Topical Analgesic, (BIOFREEZE) 4 % GEL Apply 1 application topically 2 (two) times daily as needed (pain in lower legs/hands). 5%   Yes [provider]  metoprolol tartrate (LOPRESSOR) 25 MG tablet Take 12.5 mg by mouth daily.   Yes [provider]  acetaminophen-codeine (TYLENOL #4) 300-60 MG tablet Take 1 tablet by mouth 2 (two) times daily as needed for moderate pain or severe pain. Patient not taking: Reported on 02/25/2019 04/17/17   Erick Colace, MD  FLUoxetine (PROZAC) 10 MG capsule Take 20 mg by mouth daily.    [provider]  gabapentin (NEURONTIN) 400 MG capsule Take 1 capsule (400 mg total) by mouth 4 (four) times daily. Patient not taking: Reported on 02/25/2019 04/17/17   Erick Colace, MD  hydrochlorothiazide (HYDRODIURIL) 12.5 MG tablet Take 12.5 mg by mouth daily as needed.  04/10/17   [provider]  HYDROcodone-acetaminophen (NORCO/VICODIN) 5-325 MG tablet Take 1 tablet by mouth every 4 (four) hours as needed for moderate pain.    [provider]  ondansetron (ZOFRAN-ODT) 8 MG disintegrating tablet Take 1 tablet (8 mg total) by mouth every 8 (eight) hours as needed for nausea. Patient not taking: Reported on 02/25/2019 07/24/15   Elvina Sidle, MD  traMADol (ULTRAM) 50 MG tablet Take 2 tablets (100 mg total) by mouth 2 (two) times daily. Patient not taking: Reported on 02/25/2019 12/05/16   Erick Colace, MD    Family  History Family History  Problem Relation Age of Onset  . Diabetes Mother        type 2  . Diabetes Other        cousin type 1  . Diabetes Brother   . Congestive Heart Failure Brother   . Emphysema Paternal Grandfather   . Tuberculosis Paternal Grandfather     Social History Social History   Tobacco Use  . Smoking status: Former Smoker    Years: 4.00    Last attempt to quit: 10/28/1987    Years since quitting: 31.3  . Smokeless tobacco: Never Used  . Tobacco comment: 1 pack per 2 months  Substance Use Topics  . Alcohol use: No    Alcohol/week: 0.0 standard drinks  . Drug use: No     Allergies   Patient has no known allergies.  Review of Systems Review of Systems  Constitutional: Positive for activity change and fatigue.  Respiratory: Negative for shortness of breath.   Cardiovascular: Negative for chest pain.  Allergic/Immunologic: Negative for immunocompromised state.  Hematological: Does not bruise/bleed easily.  All other systems reviewed and are negative.    Physical Exam Updated Vital Signs BP (!) 130/111   Pulse 74   Temp 100.1 F (37.8 C) (Axillary)   Resp 16   SpO2 100%   Physical Exam Vitals signs and nursing note reviewed.  Constitutional:      Appearance: She is well-developed.  HENT:     Head: Normocephalic and atraumatic.  Neck:     Musculoskeletal: Normal range of motion and neck supple.  Cardiovascular:     Rate and Rhythm: Normal rate.  Pulmonary:     Effort: Pulmonary effort is normal.  Abdominal:     General: Bowel sounds are normal.  Skin:    General: Skin is warm and dry.  Neurological:     Mental Status: She is alert and oriented to person, place, and time.      ED Treatments / Results  Labs (all labs ordered are listed, but only abnormal results are displayed) Labs Reviewed  COMPREHENSIVE METABOLIC PANEL - Abnormal; Notable for the following components:      Result Value   Glucose, Bld 149 (*)    All other  components within normal limits  CBC WITH DIFFERENTIAL/PLATELET - Abnormal; Notable for the following components:   Monocytes Absolute 1.1 (*)    All other components within normal limits  URINALYSIS, ROUTINE W REFLEX MICROSCOPIC - Abnormal; Notable for the following components:   Color, Urine STRAW (*)    All other components within normal limits  URINE CULTURE  NOVEL CORONAVIRUS, NAA (HOSPITAL ORDER, SEND-OUT TO REF LAB)    EKG None  Radiology Dg Chest Port 1 View  Result Date: 02/25/2019 CLINICAL DATA:  Chest pain. EXAM: PORTABLE CHEST 1 VIEW COMPARISON:  Radiographs of March 31, 2017. FINDINGS: The heart size and mediastinal contours are within normal limits. Both lungs are clear. No pneumothorax or pleural effusion is noted. The visualized skeletal structures are unremarkable. IMPRESSION: No active disease. Electronically Signed   By: Lupita RaiderJames  Green Jr M.D.   On: 02/25/2019 13:17    Procedures Procedures (including critical care time)  Medications Ordered in ED Medications  sodium chloride 0.9 % bolus 1,000 mL (0 mLs Intravenous Stopped 02/25/19 1403)  HYDROcodone-acetaminophen (NORCO/VICODIN) 5-325 MG per tablet 1 tablet (1 tablet Oral Given 02/25/19 1430)     Initial Impression / Assessment and Plan / ED Course  I have reviewed the triage vital signs and the nursing notes.  Pertinent labs & imaging results that were available during my care of the patient were reviewed by me and considered in my medical decision making (see chart for details).        58 year old female comes in with chief complaint of body aches.  She is having some nonspecific symptoms that could be because of viral illness.  The frontal diagnosis within the viral illness will include COVID-19 as well because of her exposure at the nursing home.  We will retest her for COVID-19, given that her symptoms started after she had tested negative few days ago.  Her labs are reassuring and x-ray is fine.  She will  not need admission to the hospital. ED work-up discussed with the patient.  Strict ER return precautions also discussed.  Final Clinical Impressions(s) / ED  Diagnoses   Final diagnoses:  Flu-like symptoms  Body aches    ED Discharge Orders    None       Derwood Kaplan, MD 02/25/19 1528

## 2019-02-25 NOTE — ED Triage Notes (Signed)
Pt given tylenol at facility.

## 2019-02-26 LAB — URINE CULTURE: Culture: <10000 — AB

## 2019-02-26 LAB — NOVEL CORONAVIRUS, NAA (HOSP ORDER, SEND-OUT TO REF LAB; TAT 18-24 HRS): SARS-CoV-2, NAA: NOT DETECTED

## 2021-10-22 ENCOUNTER — Other Ambulatory Visit: Payer: Self-pay | Admitting: Family Medicine

## 2021-10-22 DIAGNOSIS — R519 Headache, unspecified: Secondary | ICD-10-CM

## 2021-11-01 ENCOUNTER — Other Ambulatory Visit: Payer: Self-pay

## 2021-11-01 ENCOUNTER — Ambulatory Visit
Admission: RE | Admit: 2021-11-01 | Discharge: 2021-11-01 | Disposition: A | Discharge status: Home / Self Care | Payer: Medicaid Other | Source: Ambulatory Visit | Attending: Family Medicine | Admitting: Family Medicine

## 2021-11-01 DIAGNOSIS — R519 Headache, unspecified: Secondary | ICD-10-CM

## 2021-11-04 ENCOUNTER — Other Ambulatory Visit: Payer: Self-pay | Admitting: Family Medicine

## 2021-11-04 DIAGNOSIS — Z1231 Encounter for screening mammogram for malignant neoplasm of breast: Secondary | ICD-10-CM

## 2021-12-03 ENCOUNTER — Other Ambulatory Visit: Payer: Self-pay

## 2021-12-03 ENCOUNTER — Ambulatory Visit: Payer: Medicaid Other | Attending: Family Medicine

## 2021-12-03 DIAGNOSIS — R2689 Other abnormalities of gait and mobility: Secondary | ICD-10-CM | POA: Insufficient documentation

## 2021-12-03 NOTE — Therapy (Addendum)
[facility] [address] [place], [place], [address] Phone: [phone]   Fax:  [phone]  Physical Therapy Evaluation  Patient Details  Name: Veronica [name] MRN: [id number] Date of Birth: [birth year] Referring Provider (PT): Patrecia Pace   Encounter Date: 12/03/2021   PT End of Session - 12/03/21 1128     Visit Number 1    Number of Visits 1    Date for PT Re-Evaluation 12/03/21    Authorization Type medicaid    PT Start Time 1128    PT Stop Time 1208    PT Time Calculation (min) 40 min    Activity Tolerance Patient tolerated treatment well    Behavior During Therapy [facility] for tasks assessed/performed             Past Medical History:  Diagnosis Date   Anxiety    Arthritis    Coronary artery disease    nonobstructive by 2010 cath   Cough    Depression    Diabetes mellitus    GERD (gastroesophageal reflux disease)    years ago   H/O diverticulitis of colon    Heart attack ([place]) 08/2009   Neuropathy    OSA (obstructive sleep apnea)    pt denies    Pneumonia    Sciatica     Past Surgical History:  Procedure Laterality Date   COLONOSCOPY     NASAL RECONSTRUCTION     ORIF PATELLA Left 10/09/2017   Procedure: [place];  Surgeon: Nicholes Stairs, MD;  Location: [place];  Service: Orthopedics;  Laterality: Left;  [place] Left 10/09/2017   TONSILLECTOMY AND ADENOIDECTOMY     UVULOPALATOPHARYNGOPLASTY      There were no vitals filed for this visit.    Subjective Assessment - 12/03/21 1222     Subjective Pt was referred for power wheelchair evaluation. Pt reports she has had issues since breaking her leg in 2018 with 4 year SNF stay after that. Reports frequent bouts of knees buckling on her and multiple stumbles.    Patient Stated Goals To obtain power wheelchair    Currently in Pain? Yes   see w/c eval                [facility] PT Assessment - 12/03/21 1219       Assessment   Medical Diagnosis falls and left leg surgery    Referring Provider (PT) Patrecia Pace    Onset Date/Surgical Date 11/07/21    Hand Dominance --   ambidexterous     Precautions   Precautions Fall      Balance Screen   Has the patient fallen in the past 6 months No    Has the patient had a decrease in activity level because of a fear of falling?  Yes    Is the patient reluctant to leave their home because of a fear of falling?  Yes      Prior Function   Level of Independence Independent with basic ADLs    Vocation On disability                        Objective measurements completed on examination: See above findings.       Mobility/Seating Evaluation    PATIENT INFORMATION: Name: Veronica [name] DOB: 1961/06/02  Sex: F Date seen: 12/03/21 Time: 11:30  Address:  [address] [address]  [place], [place] [address] Physician: Patrecia Pace, FNP This evaluation/justification form will serve as the LMN for the following suppliers: __________________________ Supplier: [employer] Contact Person: Felton Clinton Phone:  [phone]   Seating Therapist: Cherly Anderson, PT Phone:   [phone]   Phone: [phone]    Spouse/Parent/Caregiver name: Gaston Islam, daughter  Phone number: [phone] Insurance/Payer: Medicaid     Reason for Referral: power w/c evaluation  Patient/Caregiver Goals: To obtain power wheelchair to be safer and more independent in home.  Patient was seen for face-to-face evaluation for new power wheelchair.  Also present was Felton Clinton to discuss recommendations and wheelchair options.  Further paperwork was completed and sent to vendor.  Patient appears to qualify for power mobility device at this time per objective findings.   MEDICAL HISTORY: Diagnosis: Primary Diagnosis: repeated falls, left knee surgery Onset:  Diagnosis: DM2 with polyneuropathy, OA, closed  comminuted stellate fracture of  patella 11/11/2017, fibromyalgia, depression, asthma, bilateral carpal tunnel 2017, cervical spondylosis, Sciatica   _0 Progressive Disease Relevant past and future surgeries:  closed  comminuted stellate fracture of patella 11/11/2017 with repair, bilateral carpal tunnel 2017    Height: 5'5" Weight: 247 Explain recent changes or trends in weight:    History including Falls: Pt reports knees will buckle and she stumbles often.    HOME ENVIRONMENT: _1 House  _2 Condo/town home  _3 Apartment  _4 Assisted Living    _5 Lives Alone _6  Lives with Others                                                                                          Hours with caregiver: has peer support that she meets with weekly  _7 Home is accessible to patient           Stairs      _8 Yes _9  No     Ramp _10 Yes _11 No Comments:     COMMUNITY ADL: TRANSPORTATION: _12 Car    _13 Van    <HLKTGYBWLSLHTDSK>_8<\/JGOTLXBWIOMBTDHR>_41 Public Transportation    _15 Adapted w/c Lift    _16 Ambulance    _17 Other:       _18 Sits in wheelchair during transport  Employment/School: disability Specific requirements pertaining to mobility   Other:     FUNCTIONAL/SENSORY PROCESSING SKILLS:  Handedness:   _19 Right     _20 Left    _21 NA  Comments:    Functional Processing Skills for Wheeled Mobility _22 Processing Skills are adequate for safe wheelchair operation  Areas of concern than may interfere with safe operation of wheelchair Description of problem   _23  Attention to environment      _24 Judgment      _25  Hearing  _26  Vision or visual processing      _27 Motor Planning  _28  Fluctuations in Behavior      VERBAL COMMUNICATION: _29 WFL receptive _30  WFL expressive _31 Understandable  _32 Difficult to understand  _33 non-communicative _34  Uses an augmented communication device  CURRENT SEATING / MOBILITY: Current Mobility Base:  _35 None _36 Dependent _37 Manual _38 Scooter _39 Power  Type of Control:   Manufacturer:  Size:  Age:   Current Condition of Mobility Base:     Current Wheelchair components:     Describe posture in present seating system:  SENSATION and SKIN ISSUES: Sensation _0 Intact  _1 Impaired _2 Absent  Level of sensation: numbness in feet and mild numbness in hands Pressure Relief: Able to perform effective pressure relief :    _3 Yes  _4  No Method: stand If not, Why?:   Skin Issues/Skin Integrity Current Skin Issues  _5 Yes _6 No _7 Intact _8  Red area_9  Open Area  _10 Scar Tissue _11 At risk from prolonged sitting Where    History of Skin Issues  _12 Yes _13 No Where   When    Hx of skin flap surgeries  _14 Yes _15 No Where   When    Limited sitting tolerance _16 Yes _17 No Hours spent sitting in wheelchair daily:   Complaint of Pain:  Please describe: Low back 7/10 (worst up to 10/10 with being up on feet), left arm painful with recent shingles shot,    Swelling/Edema: Pt reports she does have issues with swelling in legs and takes furosemide. None noted today at eval. Pt does elevate legs a lot during the day.   ADL STATUS (in reference to wheelchair use):  Indep Assist Unable Indep with Equip Not assessed Comments  Dressing _18  _19  _20  _21  _22    Eating _23  _24  _25  _26  _27    Toileting _28  _29  _30  _31  _32  uses the grab bars for safety and has elevated toilet seat  Bathing _33  _34  _35  _36  _37  waiting on hand held shower, has shower seat she uses  Grooming/Hygiene _38  _39  _40  _41  _42    Meal Prep _43  _44  _45  _46  _47  does have to break up tasks as she can not stand long. Has to rest after 5 minutes  IADLS <YJEHUDJSHFWYOVZC>_5<\/YIFOYDXAJOINOMVE>_72  _49  _50  _51  _52    Bowel Management: _53 Continent  _54 Incontinent  _55 Accidents Comments:    Bladder Management: _56 Continent  _57 Incontinent  _58 Accidents Comments:  occasional accident     WHEELCHAIR SKILLS: Manual w/c Propulsion: _59 UE or LE strength and endurance sufficient to participate in ADLs using manual wheelchair Arm : _60 left _61 right   _62 Both      Distance:  Foot:  _63 left _64 right   _65 Both  Operate Scooter: _66  Strength, hand grip, balance and transfer appropriate for use _67 Living  environment is accessible for use of scooter  Operate Power w/c:  _68  Std. Joystick   _69  Alternative Controls Indep _70  Assist _71  Dependent/unable _72  N/A _73   _74 Safe          _75  Functional      Distance: 100'  Bed confined without wheelchair _76  Yes _77  No   STRENGTH/RANGE OF MOTION:   Range of Motion Strength  Shoulder left shoulder flexion limited to 80 degrees flexion, pain 2-/5 left shoulder flexion, right shoulder 3+/5  Elbow  4/5 bilateral  Wrist/Hand  grip WFL  Hip  3+/5 bilateral hip flexion  Knee  right knee flexion/ext=4+/5, left=4/5  Ankle  DF=3+/5 bilateral     MOBILITY/BALANCE:  _78  Patient is totally dependent for mobility      Balance Transfers Ambulation  Sitting Balance: Standing Balance: _79  Independent _80  Independent/Modified Independent  _81  WFL     _82  [facility] _83  Supervision _84  Supervision  _85  Uses UE for balance  _86  Supervision _87  Min Assist _88  Ambulates with Assist  21.33 sec=0.46ms, TUG=24.07 sec without AD    _89  Min Assist _90  Min assist _91  Mod Assist _92  Ambulates with Device:      _93  RW  _94  StW  _95  Cane  _96    _97  Mod Assist _98  Mod assist _99  Max assist   _100  Max Assist _101  Max assist _102  Dependent _103  Indep. Short  Distance Only  _0  Unable _1  Unable _2  Lift / Sling Required Distance (in feet)  50   _3  Sliding board _4  Unable to Ambulate (see explanation below)  Cardio Status:  _5 Intact  _6  Impaired   _7  NA     HR=84, BP=120/70  Respiratory Status:  _8 Intact   _9 Impaired   _10 NA     asthma and uses nasal spray and inhaler  Orthotics/Prosthetics:   Comments (Address manual vs power w/c vs scooter): Pt is 61 y/o female who presents for evaluation for power wheelchair. Pt has extensive medical history with fall from 2018 where she suffered a closed comminuted stellate fracture of patella with repair. Was in SNF for 4 years after that. Pt has PMH of DM2 with polyneuropathy, OA,  fibromyalgia, depression, asthma, bilateral carpal tunnel 2017, cervical spondylosis and  sciatica. She reports issues with legs buckling on her with multiple near falls. Pt ambulated in clinic without AD supervision 64' with gait speed of 0.20ms indicating household ambulator but not safe for community. Pt has decreased stance time on left. She has decreased strength throughout BLE 3+/5 in hips and ankles, 4/5 at knees. She is limited with left shoulder flexion to 80 degrees with pain. Pt has 7/10 pain in low back at rest with increase to 10/10 if up on feet for any longer periods. She is fall risk based on Timed Up and Go of 24.07 sec. Pt would not be able to propel manual w/c due to left shoulder issues. A scooter would not be safe for her as is challenged with rising from chair without feet under her and good UE support. Would also not fit in home well due to turning radius. Pt would benefit from power wheelchair to maximize safety in home to improve her ability to safely perform her MADLs.          Anterior / Posterior Obliquity Rotation-Pelvis   PELVIS    _11  _12  _13   Neutral Posterior Anterior  _14  _15  _16   WFL Rt elev Lt elev  _17  _18  _19   WFL Right Left                      Anterior    Anterior     _20  Fixed _21  Other _22  Partly Flexible _23  Flexible   _24  Fixed _25  Other _26  Partly Flexible  _27  Flexible  _28  Fixed _29  Other _30  Partly Flexible  _31  Flexible   TRUNK  _32  _33  _34   [facility]  Lumbar  Kyphosis Lordosis  _35  _36  _37   WFL Convex Convex  Right Left _38 c-curve _39 s-curve _40 multiple  _41  Neutral _42  Left-anterior _43  Right-anterior     _44  Fixed _45  Flexible _46  Partly Flexible _47  Other  _48  Fixed _49  Flexible _50  Partly Flexible _51  Other  _52  Fixed             _53  Flexible _54  Partly Flexible _55  Other    Position Windswept    HIPS          _56            _57               _58    Neutral       Abduct        ADduct         _59           _60            _61   Neutral Right  Left      _0  Fixed _1  Subluxed _2  Partly Flexible _3  Dislocated _4  Flexible  _5  Fixed _6  Other _7   Partly Flexible  _8  Flexible                 Foot Positioning Knee Positioning      _9  WFL  _10 Lt _11 Rt _12  WFL  _13 Lt _14 Rt    KNEES ROM concerns: ROM concerns:    & Dorsi-Flexed _15 Lt _16 Rt     FEET Plantar Flexed _17 Lt _18 Rt      Inversion                 _19 Lt _20 Rt      Eversion                 _21 Lt _22 Rt     HEAD _23  Functional _24  Good Head Control    & _25  Flexed         _26  Extended _27  Adequate Head Control    NECK _28  Rotated  Lt  _29  Lat Flexed Lt _30  Rotated  Rt _31  Lat Flexed Rt _32  Limited Head Control     _33  Cervical Hyperextension _34  Absent  Head Control     SHOULDERS ELBOWS WRIST& HAND       Left     Right    Left     Right    Left     Right   U/E _35 Functional           _36 Functional   _37 Fisting             _38 Fisting      _39 elev   _40 dep      _41 elev   _42 dep       _43 pro -_44 retract     _45 pro  _46 retract _47 subluxed             _48 subluxed           Goals for Wheelchair Mobility  _49  Independence with mobility in the home with motor related ADLs (MRADLs)  _50  Independence with MRADLs in the community _51  Provide dependent mobility  _52  Provide recline     _53 Provide tilt   Goals for Seating system _54  Optimize pressure distribution _55  Provide support needed to facilitate function or safety _56  Provide corrective forces to assist with maintaining or improving posture _57  Accommodate client's posture:   current seated postures and positions are not flexible or will not tolerate corrective forces _58  Client to be independent with relieving pressure in the wheelchair _59 Enhance physiological function such as breathing, swallowing, digestion  Simulation ideas/Equipment trials: State why other equipment was unsuccessful:   MOBILITY BASE RECOMMENDATIONS and JUSTIFICATION: MOBILITY COMPONENT JUSTIFICATION  Manufacturer: English as a second language teacher: [employer]   Size: Width 18Seat Depth 20 _60 provide transport from point A to B      _61 promote Indep mobility  _62 is not a safe, functional  ambulator _63 walker or cane inadequate _64 non-standard width/depth necessary to accommodate anatomical measurement _65    _66 Manual Mobility Base _67 non-functional ambulator    _68 Scooter/POV  _69 can safely operate  _70 can safely transfer   _71 has adequate trunk stability  _72 cannot functionally propel manual w/c  _73 Power Mobility Base  _74 non-ambulatory  _75 cannot functionally propel manual wheelchair  _76  cannot functionally and safely operate scooter/POV _77 can safely operate and willing to  _78 Stroller Base _79 infant/child  _80 unable to propel manual wheelchair _81 allows for growth _82 non-functional ambulator _83 non-functional UE _84 Indep mobility is not a goal at this time  _85 Tilt  _86 Forward _87 Backward _88 Powered tilt  _89 Manual tilt  _90   change position against gravitational force on head and shoulders  _0 change position for pressure relief/cannot weight shift _1 transfers  _2 management of tone _3 rest periods _4 control edema _5 facilitate postural control  _6    _7 Recline  _8 Power recline on power base _9 Manual recline on manual base  _10 accommodate femur to back angle  _11 bring to full recline for ADL care  _12 change position for pressure relief/cannot weight shift _13 rest periods _14 repositioning for transfers or clothing/diaper /catheter changes _15 head positioning  _16 Lighter weight required _17 self- propulsion  _18 lifting _19    _20 Heavy Duty required _21 user weight greater than 250# _22 extreme tone/ over active movement _23 broken frame on previous chair _24    _25  Back  _26  Angle Adjustable _27  Custom molded Captain's seat _28 postural control _29 control of tone/spasticity _30 accommodation of range of motion _31 UE functional control _32 accommodation for seating system _33   _34 provide lateral trunk support _35 accommodate deformity _36 provide posterior trunk support _37 provide lumbar/sacral support _38 support trunk in midline _39 Pressure relief over spinal processes  _40  Seat Cushion Captain's seat _41 impaired  sensation  _42 decubitus ulcers present _43 history of pressure ulceration _44 prevent pelvic extension _45 low maintenance  _46 stabilize pelvis  _47 accommodate obliquity _48 accommodate multiple deformity _49 neutralize lower extremity position _50 increase pressure distribution _51    _52  Pelvic/thigh support  _53  Lateral thigh guide _54  Distal medial pad  _55  Distal lateral pad _56  pelvis in neutral _57 accommodate pelvis _58  position upper legs _59  alignment _60  accommodate ROM _61  decr adduction _62 accommodate tone _63 removable for transfers _64 decr abduction  _65  Lateral trunk Supports _66  Lt     _67  Rt _68 decrease lateral trunk leaning _69 control tone _70 contour for increased contact _71 safety  _72 accommodate asymmetry _73    _74  Mounting hardware  _75 lateral trunk supports  _76 back   _77 seat _78 headrest      _79  thigh support _80 fixed   _81 swing away _82 attach seat platform/cushion to w/c frame _83 attach back cushion to w/c frame _84 mount postural supports _85 mount headrest  _86 swing medial thigh support away _87 swing lateral supports away for transfers  _88      Armrests  _89 fixed _90 adjustable height _91 removable   _92 swing away  _93 flip back   _94 reclining _95 full length pads _96 desk    _97 pads tubular  _98 provide support with elbow at 90   _99 provide support for w/c tray _100 change of height/angles for variable activities _101 remove for transfers _102 allow to come closer to table top _103 remove for access to tables _104    Hangers/ Leg rests  _105 60 _106 70 _107 90 _108 elevating _109 heavy duty  _110 articulating _111 fixed _112 lift off _113 swing away     _114 power _115 provide LE support  _116 accommodate to hamstring tightness _117 elevate legs during recline   _118 provide change in position for Legs _119 Maintain placement of feet on footplate _120 durability _121 enable transfers _122 decrease edema _123 Accommodate lower leg length _124    Foot support Footplate    <UTMLYYTKPTWSFKCL>_2<\/XNTZGYFVCBSWHQPR>_916 Lt  _126  Rt  _127  Center mount _128 flip up     _129 depth/angle adjustable _130 Amputee adapter    _131  Lt      _132  Rt _133 provide foot support _134 accommodate to ankle ROM _135 transfers _136 Provide support for residual extremity _137  allow foot to go under wheelchair base _138  decrease tone  _139    _140  Ankle strap/heel loops _141 support foot on foot support _142 decrease extraneous movement _143 provide input to heel  _144 protect foot  Tires: _145 pneumatic  _146 flat free inserts  _147 solid  _148 decrease maintenance  _149 prevent frequent flats _150 increase shock absorbency _151 decrease pain from road shock _152 decrease spasms from road shock _153    _154  Headrest  _155 provide posterior head support _156 provide posterior neck support _157 provide lateral head support _158 provide anterior head support _159 support during tilt and recline _160 improve feeding   _161   improve respiration _0 placement of switches _1 safety  _2 accommodate ROM  _3 accommodate tone _4 improve visual orientation  _5  Anterior chest strap _6  Vest _7  Shoulder retractors  _8 decrease forward movement of shoulder _9 accommodation of TLSO _10 decrease forward movement of trunk _11 decrease shoulder elevation _12 added abdominal support _13 alignment _14 assistance with shoulder control  _15    Pelvic Positioner _16 Belt _17 SubASIS bar _18 Dual Pull _19 stabilize tone _20 decrease falling out of chair/ **will not Decr potential for sliding due to pelvic tilting _21 prevent excessive rotation _22 pad for protection over boney prominence _23 prominence comfort _24 special pull angle to control rotation _25    Upper Extremity Support _26 L   _27  R _28 Arm trough    _29 hand support _30  tray       _31 full tray _32 swivel mount _33 decrease edema      _34 decrease subluxation   _35 control tone   _36 placement for AAC/Computer/EADL _37 decrease gravitational pull on shoulders _38 provide midline positioning _39 provide support to increase UE function _40 provide hand support in natural position _41 provide work surface   POWER WHEELCHAIR CONTROLS  _42 Proportional  _43 Non-Proportional Type  _44 Left  _45 Right _46 provides access  for controlling wheelchair   _47 lacks motor control to operate proportional drive control <FYBOFBPZWCHENIDP>_8<\/EUMPNTIRWERXVQMG>_86 unable to understand proportional controls  Actuator Control Module  _49 Single  _50 Multiple   _51 Allow the client to operate the power seat function(s) through the joystick control   _52 Safety Reset Switches _53 Used to change modes and stop the wheelchair when driving in latch mode    _54 [employer]   _55 programming for accurate control _56 progressive Disease/changing condition _57 non-proportional drive control needed _58 Needed in order to operate power seat functions through joystick control   _59 Display box _60 Allows user to see in which mode and drive the wheelchair is set  _61 necessary for alternate controls    _62 Digital interface electronics _63 Allows w/c to operate when using alternative drive controls  <PYPPJKDTOIZTIWPY>_0<\/DXIPJASNKNLZJQBH>_41 ASL Head Array _65 Allows client to operate wheelchair  through switches placed in tri-panel headrest  _66 Sip and puff with tubing kit _67 needed to operate sip and puff drive controls  <PFXTKWIOXBDZHGDJ>_2<\/EQASTMHDQQIWLNLG>_92 Upgraded tracking electronics _69 increase safety when driving <JJHERDEYCXKGYJEH>_6<\/DJSHFWYOVZCHYIFO>_27 correct tracking when on uneven surfaces  _71 [facility] for switches or joystick _72 Attaches switches to w/c  _73 Swing away for access or transfers _74 midline for optimal placement _75 provides for consistent access  _76 Attendant controlled joystick plus mount _77 safety _78 long distance driving <XAJOINOMVEHMCNOB>_0<\/JGGEZMOQHUTMLYYT>_03 operation of seat functions _80 compliance with transportation regulations _81      Rear wheel placement/Axle adjustability _82 None _83 semi adjustable _84 fully adjustable  _85 improved UE access to wheels _86 improved stability _87 changing angle in space for improvement of postural stability _88 1-arm drive access <TWSFKCLEXNTZGYFV>_4<\/BSWHQPRFFMBWGYKZ>_99 amputee pad placement _90    Wheel rims/ hand rims  _91 metal  _92 plastic coated _93 oblique projections _94 vertical projections _95 Provide ability to propel manual wheelchair  _96  Increase self-propulsion with hand weakness/decreased grasp  Push handles _97 extended  _98 angle  adjustable  _99 standard _100 caregiver access _101 caregiver assist _102 allows hooking to enable increased ability to perform ADLs or maintain balance  One armed device  _103 Lt   _104 Rt _105 enable propulsion of manual wheelchair with one arm   _106      Brake/wheel lock extension _107  Lt   _108  Rt _109 increase indep in applying wheel locks   _110 Side guards _111 prevent clothing getting caught in wheel or becoming soiled _112  prevent skin tears/abrasions  Battery: U1x2 _113 to power wheelchair   Other:     The above equipment has a life- long use expectancy. Growth and changes in medical and/or functional conditions would be the exceptions. This is to certify that the therapist has no financial relationship with durable medical provider or manufacturer. The therapist will not  receive remuneration of any kind for the equipment recommended in this evaluation.   Patient has mobility limitation that significantly impairs safe, timely participation in one or more mobility related ADL's.  (bathing, toileting, feeding, dressing, grooming, moving from room to room)                                                             _0  Yes _1  No Will mobility device sufficiently improve ability to participate and/or be aided in participation of MRADL's?         _2  Yes _3  No Can limitation be compensated for with use of a cane or walker?                                                                                _4  Yes _5  No Does patient or caregiver demonstrate ability/potential ability & willingness to safely use the mobility device?   _6  Yes _7  No Does patient's home environment support use of recommended mobility device?                                                    _8  Yes _9  No Does patient have sufficient upper extremity function necessary to functionally propel a manual wheelchair?    _10  Yes _11  No Does patient have sufficient strength and trunk stability to safely operate a POV (scooter)?                                  _12   Yes _13  No Does patient need additional features/benefits provided by a power wheelchair for MRADL's in the home?       _14  Yes _15  No Does the patient demonstrate the ability to safely use a power wheelchair?                                                              _16  Yes _17  No  Therapist Name Printed: Cherly Anderson Date: 12/03/21  Therapist's Signature:   Date:   Supplier's Name Printed: Luz Brazen Date: 12/03/21  Supplier's Signature:   Date:  Patient/Caregiver Signature:   Date:     This is to certify that I have read this evaluation and do agree with the content within:   Physician's Name Printed: Patrecia Pace  Physician's Signature:  Date:     This is to certify that I, the above signed therapist have the following affiliations: _18  This DME provider _19  Manufacturer of recommended equipment _20  Patient's long term care facility _21  None of the above  Plan - 12/03/21 1225     Clinical Impression Statement Pt is 61 y/o female who presents for evaluation for power wheelchair. Pt has extensive medical history with fall from 2018 where she suffered a closed  comminuted stellate fracture of patella. Was in SNF for 4 years after that. Pt has PMH of DM2 with polyneuropathy, OA,  fibromyalgia, depression, asthma, bilateral carpal tunnel 2017, cervical spondylosis and sciatica. She reports issues with legs buckling on her with multiple near falls. Pt ambulated in clinic without AD supervision 31' with gait speed of 0.5ms indicating household ambulator but not safe for community. Pt has decreased stance time on left. She has decreased strength throughout BLE 3+/5 in hips and ankles, 4/5 at knees. She is limited with left shoulder flexion to 80 degrees with pain. Pt has 7/10 pain in low back at rest with increase to 10/10 if up on feet for any longer periods. She is fall risk based on Timed Up and Go of 24.07 sec. Pt would not be able to  propel manual w/c due to left shoulder issues. A scooter would not be safe for her as is challenged with rising from chair without feet under her and good UE support. Would also not fit in home well due to turning radius. Pt would benefit from power wheelchair to maximize safety in home to improve her ability to safely perform her MADLs.    Personal Factors and Comorbidities Comorbidity 3+    Comorbidities DM2 with polyneuropathy, OA, closed  comminuted stellate fracture of patella 11/11/2017, fibromyalgia, depression, asthma, bilateral carpal tunnel 2017, cervical spondylosis, Sciatica    Examination-Activity Limitations Locomotion Level;Transfers;Stand    Examination-Participation Restrictions Community Activity;Cleaning;Meal Prep    Stability/Clinical Decision Making Evolving/Moderate complexity    Clinical Decision Making Moderate    Rehab Potential Good    PT Frequency One time visit    PT Next Visit Plan W/c eval only    Consulted and Agree with Plan of Care Patient             Patient will benefit from skilled therapeutic intervention in order to improve the following deficits and impairments:  Abnormal gait, Decreased balance, Decreased mobility, Decreased strength, Impaired sensation, Pain, Decreased knowledge of use of DME  Visit Diagnosis: Other abnormalities of gait and mobility     Problem List Patient Active Problem List   Diagnosis Date Noted   Displaced comminuted fracture of left patella, initial encounter for closed fracture 10/09/2017   H/O diverticulitis of colon    Lumbar degenerative disc disease 02/02/2017   Fibromyalgia syndrome 02/02/2017   Hyponatremia 11/14/2016   Research subject 11/14/2016   Hypoxia 11/13/2016   Respiratory failure with hypoxia ([facility] 11/13/2016   SIRS (systemic inflammatory response syndrome) ([place] 11/13/2016   Influenza A 11/13/2016   Anxiety and depression 11/13/2016   Thyroid nodule 11/13/2016   Diabetic polyneuropathy  associated with type 2 diabetes mellitus ([facility] 08/15/2016   Chronic midline low back pain 08/15/2016   Lumbar facet arthropathy 08/15/2016   Cough 08/15/2015   OSA (obstructive sleep apnea) 08/15/2015   Sciatica 08/15/2015   Diabetes mellitus ([facility] 08/15/2015   Hiatal hernia 08/15/2015   GERD (gastroesophageal reflux disease) 08/15/2015    Veronica [name] PT, DPT, NCS 12/03/2021, 12:37 PM  [address]SHendersonGForest Home [place] [address]Phone: [phone]  Fax:  [phone] Name: Veronica MINISHMRN: [id number] of Birth: [id number]

## 2022-06-16 ENCOUNTER — Encounter (HOSPITAL_COMMUNITY): Payer: Self-pay | Admitting: Mental Health

## 2022-06-16 ENCOUNTER — Ambulatory Visit (INDEPENDENT_AMBULATORY_CARE_PROVIDER_SITE_OTHER): Payer: Medicaid Other | Admitting: Mental Health

## 2022-06-16 ENCOUNTER — Encounter (HOSPITAL_COMMUNITY): Payer: Self-pay

## 2022-06-16 DIAGNOSIS — F322 Major depressive disorder, single episode, severe without psychotic features: Secondary | ICD-10-CM

## 2022-06-16 DIAGNOSIS — F4310 Post-traumatic stress disorder, unspecified: Secondary | ICD-10-CM | POA: Insufficient documentation

## 2022-06-16 DIAGNOSIS — F411 Generalized anxiety disorder: Secondary | ICD-10-CM

## 2022-06-16 NOTE — Plan of Care (Signed)
  Problem: Depression CCP Problem  1 MDD Goal: LTG: Reduce frequency, intensity, and duration of depression symptoms as evidenced by PHQ9 score of 5 or less  Outcome: Initial Goal: STG: Veronica [name] will decrease sxs of depression AEB development of x 5 effective coping skills with ability to reframe maladaptive thinking patterns 7/7 days within the next 6 months Outcome: Initial Goal: STG: Veronica [name] will decrease sxs of depression AEB engagement in enjoyable activities 3/7 days weekly within the next 6 months  Outcome: Initial   Problem: Anxiety Disorder CCP Problem  1 GAD Goal: LTG: Patient will score less than 5 on the Generalized Anxiety Disorder 7 Scale (GAD-7) Outcome: Initial Goal: [name] will decrease sxs of anxiety AEB ability to engage in relaxation and grounding coping skills daily as needed within the next 6 months  Outcome: Initial
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
## 2022-06-26 ENCOUNTER — Ambulatory Visit (INDEPENDENT_AMBULATORY_CARE_PROVIDER_SITE_OTHER): Payer: Medicaid Other | Admitting: Mental Health

## 2022-06-26 DIAGNOSIS — F322 Major depressive disorder, single episode, severe without psychotic features: Secondary | ICD-10-CM

## 2022-06-26 DIAGNOSIS — F411 Generalized anxiety disorder: Secondary | ICD-10-CM
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
## 2022-06-27 NOTE — Plan of Care (Signed)
  Problem: Depression CCP Problem  1 MDD Goal: LTG: Reduce frequency, intensity, and duration of depression symptoms as evidenced by PHQ9 score of 5 or less  Outcome: Not Progressing Goal: STG: Darrel will decrease sxs of depression AEB development of x 5 effective coping skills with ability to reframe maladaptive thinking patterns 7/7 days within the next 6 months Outcome: Not Progressing Goal: STG: Alyric will decrease sxs of depression AEB engagement in enjoyable activities 3/7 days weekly within the next 6 months  Outcome: Not Progressing   Problem: Anxiety Disorder CCP Problem  1 GAD Goal: LTG: Patient will score less than 5 on the Generalized Anxiety Disorder 7 Scale (GAD-7) Outcome: Not Progressing

## 2022-07-03 ENCOUNTER — Encounter (HOSPITAL_COMMUNITY): Payer: Self-pay | Admitting: Student

## 2022-07-03 ENCOUNTER — Ambulatory Visit (INDEPENDENT_AMBULATORY_CARE_PROVIDER_SITE_OTHER): Payer: Medicaid Other | Admitting: Student

## 2022-07-03 VITALS — BP 114/64 | HR 92 | Wt 223.4 lb

## 2022-07-03 DIAGNOSIS — F331 Major depressive disorder, recurrent, moderate: Secondary | ICD-10-CM

## 2022-07-03 DIAGNOSIS — F4310 Post-traumatic stress disorder, unspecified: Secondary | ICD-10-CM

## 2022-07-03 DIAGNOSIS — F411 Generalized anxiety disorder: Secondary | ICD-10-CM

## 2022-07-03 MED ORDER — DULOXETINE HCL 30 MG PO CPEP: 30.0000 mg | ORAL_CAPSULE | Freq: Every day | ORAL | 1 refills | Status: DC

## 2022-07-03 MED ORDER — DULOXETINE HCL 60 MG PO CPEP: 60.0000 mg | ORAL_CAPSULE | Freq: Every day | ORAL | 1 refills | Status: DC

## 2022-07-03 MED ORDER — TRAZODONE HCL 50 MG PO TABS: 25.0000 mg | ORAL_TABLET | Freq: Every evening | ORAL | 1 refills | Status: DC | PRN

## 2022-07-03 NOTE — Patient Instructions (Addendum)
Dear Veronica [name],  It was a pleasure meeting you! I have increased your cymbalta to 90 mg capsule (1 60 mg capsule and 1 30 mg capsule). I have sent in a prescription for trazodone as well. Please take your medications as prescribed. Please   Please follow up in 4-6 weeks to see how you are doing. Report any adverse effects and or reactions from the medicines to your outpatient provider promptly. In the event of worsening symptoms, call the crisis hotline, 911 and or go to the nearest ED for appropriate evaluation and treatment of symptoms. Follow-up with your primary care provider for your other medical issues, concerns and or health care needs.  Take care! -Dr. Hazle Quant

## 2022-07-09 ENCOUNTER — Ambulatory Visit (INDEPENDENT_AMBULATORY_CARE_PROVIDER_SITE_OTHER): Payer: Medicaid Other | Admitting: Mental Health

## 2022-07-09 DIAGNOSIS — F411 Generalized anxiety disorder: Secondary | ICD-10-CM

## 2022-07-09 DIAGNOSIS — F322 Major depressive disorder, single episode, severe without psychotic features: Secondary | ICD-10-CM | POA: Diagnosis not present

## 2022-07-09 DIAGNOSIS — F4310 Post-traumatic stress disorder, unspecified: Secondary | ICD-10-CM

## 2022-07-09 NOTE — Plan of Care (Signed)
  Problem: Depression CCP Problem  1 MDD Goal: STG: Veronica [name] will decrease sxs of depression AEB development of x 5 effective coping skills with ability to reframe maladaptive thinking patterns 7/7 days within the next 6 months Outcome: Progressing Goal: STG: Veronica [name] will decrease sxs of depression AEB engagement in enjoyable activities 3/7 days weekly within the next 6 months  Outcome: Progressing   Problem: Anxiety Disorder CCP Problem  1 GAD Goal: Veronica [name] will decrease sxs of anxiety AEB ability to engage in relaxation and grounding coping skills daily as needed within the next 6 months  Outcome: Progressing

## 2022-07-09 NOTE — Progress Notes (Signed)
THERAPIST PROGRESS NOTE  Session Time: 11:05 (54 minutes)  Participation Level: Active  Behavioral Response: CasualAlertDysphoric  Type of Therapy: Individual Therapy  Treatment Goals addressed: Veronica [name] will decrease sxs of depression AEB development of x 5 effective coping skills with ability to reframe maladaptive thinking patterns 7/7 days within the next 6 months Veronica [name] will decrease sxs of depression AEB engagement in enjoyable activities 3/7 days weekly within the next 6 months   Veronica [name] will decrease sxs of anxiety AEB ability to engage in relaxation and grounding coping skills daily as needed within the next 6 months   ProgressTowards Goals: Progressing  Interventions: CBT and Supportive  Summary: Veronica [name] is a 61 y.o. female who presents with hx of dx of depression and generalized anxiety. Veronica [name] presents alert and oriented; mood and affect low, depressed. Speech clear and coherent at normal rate and tone. Engaged and receptive to interventions. Veronica [name] shares to have been largely staying at home in bed and declining to engage with others. 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 Shares to have discussed with husband in regards to enragement with the church and for him to have been supportive. 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 Shares history of supporting others and difficulty being there for her self and putting her self on the 'back burner." Shares this has lead to feelings of low mood and feeling overwhelmed and thus isolating from others. Veronica [name] isolative behaviors vs. Recharging. Shares feelings of depression possibly increasing due to realization of recent passing of brother and shares with clinician history of relationship with brother and mother. Shares to have been on speaking terms with brother upon his passing and not sure why. Shares mother to have  made comment at funeral that she and her brother's wife to have been the cause of his passing. Veronica [name] working to prioritize self and learning to put in place better boundaries with self. Shares working to reframe distorted thought of having to be everything to everyone and reframing of thoughts to support others while supporting herself. Reports to have been Veronica to engage in attending grand-sons game in which she enjoyed which supported in reducing depression. Reports engagement in meditation and scripture as coping skill to support in relaxation. No safety concerns reported.    Suicidal/Homicidal: Nowithout intent/plan  Therapist Response: Therapist engaged Veronica [name] in therapy session. Assessed for current level of functioning, sxs management and level of stressors. Provided support and encouragement validated feelings and provided supportive feedback. Supported Health visitor in processing thoughts and feelings related to feelings of low mood/depression. Explored factors contributing to depression and explored ability to prioritize self and set boundaries. Supported in processing history of marriage and ministry and hx of Dance movement psychotherapist first. Processed feelings of being overwhelmed with difficulty declining others needs for the sake of her own emotional capacity. Encouraged boundary setting and engagement in things in which she enjoys to support in reduction in depressive sxs. Explored isolation vs recharging. Supported in identifying distorted thinking in relationship to ministry and sacrificing self at the detriment to her mental health. Reviewed session and rescheduled.    Plan: Return again in x2  weeks.  Diagnosis: Severe major depression without psychotic features (HCC)  Generalized anxiety disorder  Post traumatic stress disorder  Collaboration of Care: Other None  Patient/Guardian was advised Release of Information must be obtained prior to any  record release in order to collaborate  their care with an outside provider. Patient/Guardian was advised if they have not already done so to contact the registration department to sign all necessary forms in order for [place] to release information regarding their care.   Consent: Patient/Guardian gives verbal consent for treatment and assignment of benefits for services provided during this visit. Patient/Guardian expressed understanding and agreed to proceed.   Stephan Minister [place], [facility] 07/09/2022

## 2022-07-23 ENCOUNTER — Ambulatory Visit (INDEPENDENT_AMBULATORY_CARE_PROVIDER_SITE_OTHER): Payer: Medicaid Other | Admitting: Mental Health

## 2022-07-23 DIAGNOSIS — F322 Major depressive disorder, single episode, severe without psychotic features: Secondary | ICD-10-CM | POA: Diagnosis not present

## 2022-07-23 DIAGNOSIS — F411 Generalized anxiety disorder: Secondary | ICD-10-CM

## 2022-07-23 NOTE — Plan of Care (Signed)
  Problem: Depression CCP Problem  1 MDD Goal: LTG: Reduce frequency, intensity, and duration of depression symptoms as evidenced by PHQ9 score of 5 or less  Outcome: Progressing Goal: STG: [name] will decrease sxs of depression AEB development of x 5 effective coping skills with ability to reframe maladaptive thinking patterns 7/7 days within the next 6 months Outcome: Progressing Goal: STG: [name] will decrease sxs of depression AEB engagement in enjoyable activities 3/7 days weekly within the next 6 months  Outcome: Progressing   Problem: Anxiety Disorder CCP Problem  1 GAD Goal: LTG: Patient will score less than 5 on the Generalized Anxiety Disorder 7 Scale (GAD-7) Outcome: Progressing Goal: [name] will decrease sxs of anxiety AEB ability to engage in relaxation and grounding coping skills daily as needed within the next 6 months  Outcome: Progressing

## 2022-07-23 NOTE — Progress Notes (Addendum)
   THERAPIST PROGRESS NOTE  Session Time: 11:13am   Participation Level: Active  Behavioral Response: CasualAlertDysphoric  Type of Therapy: Individual Therapy  Treatment Goals addressed: STG: Silva will decrease sxs of depression AEB development of x 5 effective coping skills with ability to reframe maladaptive thinking patterns 7/7 days within the next 6 month  STG: Maliah will decrease sxs of depression AEB engagement in enjoyable activities 3/7 days weekly within the next 6 months   UDJ:[id number] will decrease sxs of anxiety AEB a  bility to engage in relaxation and grounding coping skills daily as needed within the next 6 months   ProgressTowards Goals: Progressing  Interventions: CBT and Supportive  Summary: CAYDANCE KUEHNLE is a 61 y.o. female who presents with hx of dx of depression and generalized anxiety. Rokhaya presents alert and oriented; mood and affect adequate; neutral. Becomes tearful at times. Speech clear and coherent at normal rate and tone. Engaged and receptive to interventions. Teriah shares for today to be her deceased father's birthday and shares picture of father with clinian. Shares concering relationshp with father and desire to buy balloons for a balloon release; would like to do the same for deceased daughter who birthday was in July. Shares has been working to not to isolate and working to process thoughts and feelings in effective manner with no maladaptive thinking patterns present. Shares has been explore ability to step back and slow down and engage in relaxation skills to reduce feelings of being overwhelmed and put in place boundaries with ability to say no as needed. ABle to process error in thinking of working to 'be everything to everybody' and now allowing time for her self and ability to prioritize her marriage. Shares cocnering past and previous home and presence of constant murder's in the area resulting in several of her trauma experiences. Reports  working to manage physical health, support for husband, engaging in [place] and prioritizing her needs. No safety concerns reported.    Suicidal/Homicidal: Nowithout intent/plan  Therapist Response: Therapist engaged [place] in therapy session. Assessed for current level of functioning, sxs management and level of stressors. Provided support and encouragement validated feelings and provided supportive feedback. Supported Chartered loss adjuster in processing thoughts and feelings related to feelings of low mood/depression and ability to cope with depression and anxiety. Explored for ability to engage in coping skills and working to engage in activities in which she enjoys. Explored ability to engage in cognitive coping and reframing maladaptive thoughts as needed. Reviewed ability to recharge without isolating self from others. Supported Chartered loss adjuster in processing thoughts and feelings related to father's birthday, marriage and working to set increased boundaries with her self and others to support in reduction of sxs. Reviewed session and scheduled follow up.   Plan: Return again in  x 2 weeks.  Diagnosis: Severe major depression without psychotic features ([place])  Generalized anxiety disorder  Collaboration of Care: Other None  Patient/Guardian was advised Release of Information must be obtained prior to any record release in order to collaborate their care with an outside provider. Patient/Guardian was advised if they have not already done so to contact the registration department to sign all necessary forms in order for [place] to release information regarding their care.   Consent: Patient/Guardian gives verbal consent for treatment and assignment of benefits for services provided during this visit. Patient/Guardian expressed understanding and agreed to proceed.   Rockey Situ [place], [facility] 07/23/2022

## 2022-07-31 ENCOUNTER — Encounter (HOSPITAL_COMMUNITY): Payer: Medicaid Other | Admitting: Student

## 2022-08-06 ENCOUNTER — Ambulatory Visit (INDEPENDENT_AMBULATORY_CARE_PROVIDER_SITE_OTHER): Payer: Medicaid Other | Admitting: Mental Health

## 2022-08-06 DIAGNOSIS — F322 Major depressive disorder, single episode, severe without psychotic features: Secondary | ICD-10-CM | POA: Diagnosis not present

## 2022-08-06 DIAGNOSIS — F411 Generalized anxiety disorder: Secondary | ICD-10-CM

## 2022-08-06 NOTE — Progress Notes (Signed)
THERAPIST PROGRESS NOTE  Session Time: 11:05AM ( 53 minutes)  Participation Level: Active  Behavioral Response: CasualAlertEuthymic  Type of Therapy: Individual Therapy  Treatment Goals addressed: STG: Nyaja will decrease sxs of depression AEB development of x 5 effective coping skills with ability to reframe maladaptive thinking patterns 7/7 days within the next 6 months  STG: Rinoa will decrease sxs of depression AEB engagement in enjoyable activities 3/7 days weekly within the next 6 months    [id number] will decrease sxs of anxiety AEB ability to engage in relaxation and grounding coping skills daily as needed within the next 6 months  ProgressTowards Goals: Progressing  Interventions: CBT and Supportive  Summary: . AMBRIE CARTE is a 61 y.o. female who presents with hx of dx of depression and generalized anxiety. Timiyah presents alert and oriented; mood and affect adequate; euthymic. Seech clear and coherent at normal rate and tone. Engaged and receptive to interventions. Romina shares to have been recently not feeling well and to have had a poor reaction to COVID booster, shares to have felt largely lethargic and spent a large amount of time in bed. Reports for moods to have been low with some crying spells present but shares to feel this was secondary to not taking medications for x 3 days. 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 Shares concern for husband and his moods and shares he has been engaging in [employer] as well. Agrees with clinician for this to have became a bonding piece for them and ability to reconnect. Shares concern for husband memory and reports for this to cause agitation with engagement with him at times. Shares has been able to increase enjoyable activities in which she engages with watching of a tv show in which she enjoys as well as getting her hair and nails  done. Reports to be working on ensuring balance of attending to her own needs, husbands and ministries and not allowing self to become overwhelmed and processing thoughts well. No concerns for safety reported.  Geisha reports working to use Radiographer, therapeutic or relaxation and management of depression as needed. Shares x 2 coping skills and working to reframe thoughts. Identification of enjoyable activities   Suicidal/Homicidal: Nowithout intent/plan  Therapist Response: Therapist engaged [place] in therapy session. Assessed for current level of functioning, sxs management and level of stressors. Provided support and encouragement validated feelings and provided supportive feedback. Supported Chartered loss adjuster in processing thoughts and feelings related to concerns with husband and encouraged her to continue to follow up with providers concerning expectation of progress in his level of functioning. Explored with Jodye ability to engage in balance in daily life with ability to prioritize her own needs while managing husband as well as ministry. Encouraged ongoing medication compliance. Reviewed session and provided follow up session. Assessed for safety.    Plan: Return again in  X2 weeks.  Diagnosis: Severe major depression without psychotic features ([place])  Generalized anxiety disorder  Collaboration of Care: Other NONE  Patient/Guardian was advised Release of Information must be obtained prior to any record release in order to collaborate their care with an outside provider. Patient/Guardian was advised if they have not already done so to contact the registration department to  sign all necessary forms in order for [place] to release information regarding their care.   Consent: Patient/Guardian gives verbal consent for treatment and assignment of benefits for services provided during this  visit. Patient/Guardian expressed understanding and agreed to proceed.   Rockey Situ [place], [facility] 08/06/2022

## 2022-08-06 NOTE — Plan of Care (Signed)
  Problem: Depression CCP Problem  1 MDD Goal: LTG: Reduce frequency, intensity, and duration of depression symptoms as evidenced by PHQ9 score of 5 or less  Outcome: Progressing Goal: STG: Veronica [name] will decrease sxs of depression AEB development of x 5 effective coping skills with ability to reframe maladaptive thinking patterns 7/7 days within the next 6 months Outcome: Progressing Goal: STG: Veronica [name] will decrease sxs of depression AEB engagement in enjoyable activities 3/7 days weekly within the next 6 months  Outcome: Progressing   Problem: Anxiety Disorder CCP Problem  1 GAD Goal: LTG: Patient will score less than 5 on the Generalized Anxiety Disorder 7 Scale (GAD-7) Outcome: Progressing Goal: STG:Veronica [name] will decrease sxs of anxiety AEB ability to engage in relaxation and grounding coping skills daily as needed within the next 6 months  Outcome: Progressing   

## 2022-08-21 ENCOUNTER — Ambulatory Visit (HOSPITAL_COMMUNITY): Payer: Medicaid Other | Admitting: Mental Health

## 2022-09-09 ENCOUNTER — Ambulatory Visit (INDEPENDENT_AMBULATORY_CARE_PROVIDER_SITE_OTHER): Payer: Medicaid Other | Admitting: Mental Health

## 2022-09-09 DIAGNOSIS — F322 Major depressive disorder, single episode, severe without psychotic features: Secondary | ICD-10-CM | POA: Diagnosis not present

## 2022-09-09 DIAGNOSIS — F411 Generalized anxiety disorder: Secondary | ICD-10-CM

## 2022-09-09 NOTE — Plan of Care (Signed)
  Problem: Depression CCP Problem  1 MDD Goal: LTG: Reduce frequency, intensity, and duration of depression symptoms as evidenced by PHQ9 score of 5 or less  Outcome: Progressing Goal: STG: Rickia will decrease sxs of depression AEB development of x 5 effective coping skills with ability to reframe maladaptive thinking patterns 7/7 days within the next 6 months Outcome: Progressing Goal: STG: Macee will decrease sxs of depression AEB engagement in enjoyable activities 3/7 days weekly within the next 6 months  Outcome: Progressing   Problem: Anxiety Disorder CCP Problem  1 GAD Goal: LTG: Patient will score less than 5 on the Generalized Anxiety Disorder 7 Scale (GAD-7) Outcome: Progressing Goal: [id number] will decrease sxs of anxiety AEB ability to engage in relaxation and grounding coping skills daily as needed within the next 6 months  Outcome: Progressing

## 2022-09-09 NOTE — Progress Notes (Signed)
THERAPIST PROGRESS NOTE  Session Time: 1:41pm ( 50 minutes)  Participation Level: Active  Behavioral Response: CasualAlertAdequate  Type of Therapy: Individual Therapy  Treatment Goals addressed: STG: Veronica [name] will decrease sxs of depression AEB development of x 5 effective coping skills with ability to reframe maladaptive thinking patterns 7/7 days within the next 6 months   STG: Veronica [name] will decrease sxs of depression AEB engagement in enjoyable activities 3/7 days weekly within the next 6 months    [name] will decrease sxs of anxiety AEB ability to engage in relaxation and grounding coping skills daily as needed within the next 6 months   ProgressTowards Goals: Progressing  Interventions: Supportive  Summary:  Veronica [name] is a 61 y.o. female who presents with hx of dx of depression and generalized anxiety. Veronica [name] presents alert and oriented; mood and affect adequate; appropriate to situation. Seech clear and coherent at normal rate and tone. Engaged and receptive to interventions. Veronica [name] shares concerning recent passing of her mother in Social worker. Shares with therapist to have not cried and reports to have spent time prior to her passing crying with awareness of her being in hospice. Shares thoughts of feeling as if something is wrong with her and able to process and normalize feelings of grief. Shares concerning history of engagement with mother in law and family in law with family in law to at times be unkind and act in manner in which they are not fond of her. Shares thoughts on her own mother and mother favoring her sister in Social worker. Shares thoughts of working to focus on her self and wanting to be in a place peace and not in disagreement with others. Shares concerning history of being in nursing home and increased anxiety and thoughts of ending marriage being in dark place. Reports to have been doing the work to manage depression and anxiety and ongoing desire to engage in self-care as  needed.  Ongoing progress with ability to utilize coping skills for moods; decrease in maladaptive thinknig patterns and able to reframe as needed. 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   Suicidal/Homicidal: Nowithout intent/plan  Therapist Response: Therapist engaged Veronica [name] in therapy session. Assessed for current level of functioning, sxs management and level of stressors. Provided support and encouragement validated feelings and provided supportive feedback. Supported Health visitor in processing thoughts and feelings related to passing of mother in Social worker. Provided psycho-education and normalization on feelings of grief and encouraged Veronica [name] to not judge her feelings of grief. Supported in processing thoughts and feelings related to engagement with mother in law, family in law and her mother. Reviewed ability to put in place boundaries and navigate access others have to her. Reviewed use of coping skills and ability to engage in self-care and enjoyable activities. Reviewed session and provided follow up.  Plan: Return again in  x 2 weeks.  Diagnosis: Severe major depression without psychotic features (HCC)  Generalized anxiety disorder  Collaboration of Care: Other None  Patient/Guardian was advised Release of Information must be obtained prior to any record release in order to collaborate their care with an outside provider. Patient/Guardian was advised if they have not already done so to contact the registration department to sign all necessary forms in order for [place] to release information regarding their care.   Consent: Patient/Guardian gives verbal consent for treatment and assignment of benefits for services provided during this visit. Patient/Guardian expressed understanding and agreed to proceed.   Stephan Minister  Randa [place], [facility] 09/09/2022

## 2022-09-25 ENCOUNTER — Encounter (HOSPITAL_COMMUNITY): Payer: Self-pay | Admitting: Student

## 2022-09-25 ENCOUNTER — Ambulatory Visit (INDEPENDENT_AMBULATORY_CARE_PROVIDER_SITE_OTHER): Payer: Medicaid Other | Admitting: Student

## 2022-09-25 ENCOUNTER — Ambulatory Visit (INDEPENDENT_AMBULATORY_CARE_PROVIDER_SITE_OTHER): Payer: Medicaid Other | Admitting: Mental Health

## 2022-09-25 VITALS — BP 150/69 | HR 90 | Wt 219.2 lb

## 2022-09-25 DIAGNOSIS — M797 Fibromyalgia: Secondary | ICD-10-CM | POA: Diagnosis not present

## 2022-09-25 DIAGNOSIS — F4310 Post-traumatic stress disorder, unspecified: Secondary | ICD-10-CM

## 2022-09-25 DIAGNOSIS — F322 Major depressive disorder, single episode, severe without psychotic features: Secondary | ICD-10-CM

## 2022-09-25 DIAGNOSIS — F411 Generalized anxiety disorder: Secondary | ICD-10-CM

## 2022-09-25 DIAGNOSIS — F331 Major depressive disorder, recurrent, moderate: Secondary | ICD-10-CM

## 2022-09-25 MED ORDER — DULOXETINE HCL 60 MG PO CPEP: 60.0000 mg | ORAL_CAPSULE | Freq: Every day | ORAL | 1 refills | Status: DC

## 2022-09-25 MED ORDER — DULOXETINE HCL 30 MG PO CPEP: 30.0000 mg | ORAL_CAPSULE | Freq: Every day | ORAL | 1 refills | Status: DC

## 2022-09-25 NOTE — Progress Notes (Addendum)
[facility] MD Outpatient Progress Note  09/26/2022 4:47 PM Veronica [name]  MRN:  [id number]  Assessment:  Veronica [name] presents for follow-up evaluation in-person. Today, 09/26/22, Amorah reports multiple acute stressors related to multiple losses within family and worsening of her chronic pain secondary to her fibromyalgia. She self-discontinued duloxetine until about 1 week ago after which she resumed at her 90 mg dose. She denied any acute side effects from resuming duloxetine.  She reports she has not been taking her trazodone as she notices daytime grogginess the day after. Plan to continue duloxetine and follow up in 4-6 weeks to assess fo further need of medication adjustment. BP was elevated likely secondary to her fibromyalgia. She states she regularly monitor her BP and will see her PCP should it continue to be elevated even with better pain control. Anticipate pain will also be better controlled with resuming of duloxetine.  Identifying Information: Veronica [name] is a 61 y.o. y.o. female with a history of MDD, GAD, PTSD, fibromyalgia who presents in person to [facility] for medication management.  Plan:   # Major Depressive Disorder Past medication trials: Prozac (memory difficulties, ineffective) Status of problem: acute exacerbation Interventions: -- Continue duloxetine 90 mg daily for depression and anxiety  -CMP reviewed from 04/24/22, Na 140, AST/ALT wnl, eGFR>90 -- Discontinue Trazodone 25-50 mg qhs for insomnia -- Continue therapy with Rockey Situ   # Generalized Anxiety Disorder Past medication trials:  Status of problem: stable Interventions: -- Duloxetine as above   # PTSD Past medication trials:  Status of problem: stable Interventions: -- Duloxetine as above  Patient was given contact information for behavioral health clinic and was instructed to call 911 for emergencies.   Subjective:  Chief Complaint:  Chief Complaint  Patient  presents with   Depression    Interval History:  Veronica [name] reports having multiple losses in her family in the past several weeks including her mother-in-law, her son's father, and most recently a cousin. She reports needing to be strong for her family especially her husband given his own medical conditions and grief over losing his mother and family members.  Patient reports having difficult time providing self-care at times but does report that she continues to be goal-directed and future oriented.  She regularly goes to therapy in order to aid with self-care and ensure she is overall doing well. She reports recently she has also been dealing with the physical pain of her fibromyalgia. She states she tries to ensure an appropriate use of pain medication. She states she uses reishi tea and sea moss for nutritional supplementation. She states she has been having episodes of crying in bathroom due to her grief but finds those beneficial as she normally has to maintain her strength around her family member. She states she went to her ministry the day before this visit and this was very beneficial for her as she felt well supported there. She reports she had difficulty with taking her duloxetine for a period of time but has resumed it since approximately 1 week ago. She states she had used the trazodone briefly but self-discontinued after experiencing daytime grogginess. We discussed if she is not getting adequate sleep, that she could contact front desk and I can prescribe trazodone if needed. Patient denies SI/HI/AVH and is able to contract for safety.   Visit Diagnosis:    ICD-10-CM   1. Generalized anxiety disorder  F41.1     2. Moderate episode of recurrent major depressive  disorder (HCC)  F33.1 DULoxetine (CYMBALTA) 60 MG capsule    DULoxetine (CYMBALTA) 30 MG capsule    3. Post traumatic stress disorder  F43.10     4. Fibromyalgia syndrome  M79.7       Past Psychiatric History:  Previous Psych  Diagnoses: MDD, GAD, PTSD Prior inpatient treatment: denies Current/prior outpatient treatment: cymbalta 30 mg qd Psychotherapy hx: endorses individual therapy, did not like group therapy History of suicide:denies History of homicide: denies Psychiatric medication history: cymbalta and gabapentin Psychiatric medication compliance history: fair Neuromodulation history: none Current therapist: Rockey Situ    Past Medical History:  Past Medical History:  Diagnosis Date   Anxiety    Arthritis    Coronary artery disease    nonobstructive by 2010 cath   Cough    Depression    Diabetes mellitus    GERD (gastroesophageal reflux disease)    years ago   H/O diverticulitis of colon    Heart attack ([facility]) 08/2009   Neuropathy    OSA (obstructive sleep apnea)    pt denies    Pneumonia    Sciatica     Past Surgical History:  Procedure Laterality Date   COLONOSCOPY     NASAL RECONSTRUCTION     ORIF PATELLA Left 10/09/2017   Procedure: PATELLA TENDON REPAIR;  Surgeon: Nicholes Stairs, MD;  Location: [facility];  Service: Orthopedics;  Laterality: Left;  120 MINS   PARTIAL COLECTOMY     PATELLAR TENDON REPAIR Left 10/09/2017   TONSILLECTOMY AND ADENOIDECTOMY     UVULOPALATOPHARYNGOPLASTY      Family History:  Family History  Problem Relation Age of Onset   Diabetes Mother        type 2   Diabetes Other        cousin type 1   Diabetes Brother    Congestive Heart Failure Brother    Emphysema Paternal Grandfather    Tuberculosis Paternal Grandfather     Social History:  Social History   Socioeconomic History   Marital status: Legally Separated    Spouse name: Not on file   Number of children: Not on file   Years of education: Not on file   Highest education level: Not on file  Occupational History   Not on file  Tobacco Use   Smoking status: Former    Years: 4.00    Types: Cigarettes    Quit date: 10/28/1987    Years since quitting: 34.9   Smokeless tobacco:  Never   Tobacco comments:    1 pack per 2 months  Vaping Use   Vaping Use: Never used  Substance and Sexual Activity   Alcohol use: No    Alcohol/week: 0.0 standard drinks of alcohol   Drug use: No   Sexual activity: Not on file  Other Topics Concern   Not on file  Social History Narrative   Originally from [place]. Always lived in [place]. Prior travel to [place], [place], [place], & [place]. Currently works in a call center. 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 She has no pets currently. No bird or hot tub exposure. She reports that there has been mold in her basement before that was treated twice. She also has mold in her bathroom & bedroom.    Social Determinants of Health   Financial Resource Strain: Medium Risk (06/16/2022)   Overall Financial Resource Strain (CARDIA)    Difficulty of Paying Living Expenses: Somewhat hard  Food Insecurity:  No Food Insecurity (06/16/2022)   Hunger Vital Sign    Worried About Running Out of Food in the Last Year: Never true    Ran Out of Food in the Last Year: Never true  Transportation Needs: No Transportation Needs (06/16/2022)   PRAPARE - Hydrologist (Medical): No    Lack of Transportation (Non-Medical): No  Physical Activity: Inactive (06/16/2022)   Exercise Vital Sign    Days of Exercise per Week: 0 days    Minutes of Exercise per Session: 0 min  Stress: Stress Concern Present (06/16/2022)   [place]    Feeling of Stress : Very much  Social Connections: Moderately Isolated (06/16/2022)   Social Connection and Isolation Panel [NHANES]    Frequency of Communication with Friends and Family: Twice a week    Frequency of Social Gatherings with Friends and Family: Twice a week    Attends Religious Services: More than 4 times per year    Active Member of [employer] or Organizations: No    Attends Archivist Meetings: Never    Marital  Status: Separated    Allergies: No Known Allergies  Current Medications: Current Outpatient Medications  Medication Sig Dispense Refill   albuterol (VENTOLIN HFA) 108 (90 Base) MCG/ACT inhaler Inhale 2 puffs into the lungs every 4 (four) hours as needed for wheezing or shortness of breath.     aspirin 81 MG chewable tablet Chew 81 mg by mouth daily.     cyclobenzaprine (FLEXERIL) 10 MG tablet Take 10 mg by mouth daily as needed for muscle spasms.     DULoxetine (CYMBALTA) 30 MG capsule Take 1 capsule (30 mg total) by mouth daily. Take with 60 mg capsule for a total of 90 mg of duloxetine 30 capsule 1   DULoxetine (CYMBALTA) 60 MG capsule Take 1 capsule (60 mg total) by mouth daily. 30 capsule 1   furosemide (LASIX) 20 MG tablet Take 20 mg by mouth daily.     gabapentin (NEURONTIN) 100 MG capsule Take 100 mg by mouth 2 (two) times daily.     insulin lispro (HUMALOG) 100 UNIT/ML injection Inject 2-12 Units into the skin 3 (three) times daily before meals. 0-200 Zero units 201-250 Two units 251-300 Four units 301-350 Six units 351-400 Eight units 401-450 Ten units 451-500 Twelve units 501+ call MD (Patient not taking: Reported on 07/03/2022)     insulin NPH Human (HUMULIN N,NOVOLIN N) 100 UNIT/ML injection Inject 27 Units into the skin 2 (two) times daily before a meal.  (Patient not taking: Reported on 07/03/2022)     Melatonin 10 MG TABS Take 1 tablet by mouth at bedtime. (Patient not taking: Reported on 07/03/2022)     Menthol, Topical Analgesic, (BIOFREEZE) 4 % GEL Apply 1 application topically 2 (two) times daily as needed (pain in lower legs/hands). 5% (Patient not taking: Reported on 07/03/2022)     metoprolol tartrate (LOPRESSOR) 25 MG tablet Take 12.5 mg by mouth daily.     No current facility-administered medications for this visit.    ROS: Review of Systems  Constitutional:  Positive for fatigue.  Musculoskeletal:  Positive for myalgias.  Neurological:  Negative for dizziness,  light-headedness and headaches.  Psychiatric/Behavioral:  Negative for agitation, behavioral problems, confusion, decreased concentration and hallucinations. The patient is not nervous/anxious and is not hyperactive.     Objective:  Psychiatric Specialty Exam: Blood pressure (!) 150/69, pulse 90, weight 219 lb 3.2  oz (99.4 kg).Body mass index is 36.48 kg/m.  General Appearance: Well Groomed  Eye Contact:  Fair  Speech:  Clear and Coherent and Normal Rate  Volume:  Normal  Mood:  Depressed  Affect:  Appropriate and Congruent  Thought Process:  Coherent, Goal Directed, and Linear  Orientation:  Full (Time, Place, and Person)  Thought Content: Logical   Suicidal Thoughts:  No  Homicidal Thoughts:  No  Memory:  NA  Judgment:  Good  Insight:  Good  Psychomotor Activity:  Decreased  Concentration:  Concentration: Fair and Attention Span: Fair              Assets:  Communication Skills Desire for Improvement Financial Resources/Insurance Housing Intimacy Leisure Time Physical Health Resilience Social Support Talents/Skills Transportation Vocational/Educational  ADL's:  Intact  Cognition: WNL  Sleep:  Fair   PE: General: well-appearing; no acute distress  Pulm: no increased work of breathing on room air  Strength & Muscle Tone: within normal limits Neuro: no focal neurological deficits observed  Gait & Station: normal  Metabolic Disorder Labs: Lab Results  Component Value Date   HGBA1C 10.3 (H) 11/15/2016   MPG 249 11/15/2016   MPG 243 11/14/2016   No results found for: "PROLACTIN" Lab Results  Component Value Date   CHOL  08/31/2009    189        ATP III CLASSIFICATION:  <200     mg/dL   Desirable  200-239  mg/dL   Borderline High  >=240    mg/dL   High          TRIG 59 08/31/2009   HDL 59 08/31/2009   CHOLHDL 3.2 08/31/2009   VLDL 12 08/31/2009   LDLCALC (H) 08/31/2009    118        Total Cholesterol/HDL:CHD Risk Coronary Heart Disease Risk  Table                     Men   Women  1/2 Average Risk   3.4   3.3  Average Risk       5.0   4.4  2 X Average Risk   9.6   7.1  3 X Average Risk  23.4   11.0        Use the calculated Patient Ratio above and the CHD Risk Table to determine the patient's CHD Risk.        ATP III CLASSIFICATION (LDL):  <100     mg/dL   Optimal  100-129  mg/dL   Near or Above                    Optimal  130-159  mg/dL   Borderline  160-189  mg/dL   High  >190     mg/dL   Very High   LDLCALC 118 (H) 06/26/2009     Therapeutic Level Labs: No results found for: "LITHIUM" No results found for: "VALPROATE" No results found for: "CBMZ"  Screenings: GAD-7    Flowsheet Row Counselor from 06/16/2022 in [facility]  Total GAD-7 Score 20      PHQ2-9    Flowsheet Row Counselor from 06/16/2022 in [facility]  PHQ-2 Total Score 6 4  PHQ-9 Total Score 16 15      Flowsheet Row Counselor from 06/16/2022 in [facility] No Risk  Collaboration of Care: Collaboration of Care:   Patient/Guardian was advised Release of Information must be obtained prior to any record release in order to collaborate their care with an outside provider. Patient/Guardian was advised if they have not already done so to contact the registration department to sign all necessary forms in order for [place] to release information regarding their care.   Consent: Patient/Guardian gives verbal consent for treatment and assignment of benefits for services provided during this visit. Patient/Guardian expressed understanding and agreed to proceed.   A total of 30 minutes was spent involved in face to face clinical care, chart review, and documentation.   France Ravens, MD 09/26/2022, 4:47 PM

## 2022-09-25 NOTE — Progress Notes (Signed)
   THERAPIST PROGRESS NOTE  Session Time: 1:40pm (40 minutes)  Participation Level: Active   Behavioral Response: CasualAlertDysphoric  Type of Therapy: Individual Therapy  Treatment Goals addressed: STG: [name] will decrease sxs of depression AEB development of x 5 effective coping skills with ability to reframe maladaptive thinking patterns 7/7 days within the next 6 months   STG: [name] will decrease sxs of depression AEB engagement in enjoyable activities 3/7 days weekly within the next 6 months    IDP:OEUMPN will decrease sxs of anxiety AEB ability to engage in relaxation and grounding coping skills daily as needed within the next 6 months   ProgressTowards Goals: Progressing  Interventions: Supportive  Summary: Veronica [name] is a 61 y.o. female who presents with hx of dx of depression and generalized anxiety. [name] presents alert and oriented; mood and affect adequate; appropriate to situation. Seech clear and coherent at normal rate and tone. Engaged and receptive to interventions. [name] shares concerning recent passing of mother in law and x 2 other family members. Shares feelings of being overwhelmed and working to support others. Shares events of funeral and thoughts of them not including her husband in funeral arrangements. Shares will have to return to same church in which mother in law service was held for service of her cousin. Shares plans to engage in relaxation and ability to step away from current events with visiting friend in [place]. Shares would like to get away and find time to relax and decompress from events. Denies safety concerns.  Working to manage feelings of depression and anxiety with coping with cognitive coping and engagement in relaxation.   Suicidal/Homicidal: Nowithout intent/plan  Therapist Response: Therapist engaged [name] in therapy session. Assessed for current level of functioning, sxs management and level of stressors. Provided support and  encouragement validated feelings and provided supportive feedback. Supported Health visitor in processing thoughts and feelings related to passing of mother in law and other family members. Active empathic listening and reviewed stages of grief and exploration of taking care of self vs. Always being a support to others. Explored ability to engage in self-care and take care of her emotional health. Reviewed session and provided follow up appointment. No safety concerns reported.   Plan: Return again in  x 3 weeks.  Diagnosis: Severe major depression without psychotic features (HCC)  Post traumatic stress disorder  Generalized anxiety disorder  Collaboration of Care: Other None  Patient/Guardian was advised Release of Information must be obtained prior to any record release in order to collaborate their care with an outside provider. Patient/Guardian was advised if they have not already done so to contact the registration department to sign all necessary forms in order for [place] to release information regarding their care.   Consent: Patient/Guardian gives verbal consent for treatment and assignment of benefits for services provided during this visit. Patient/Guardian expressed understanding and agreed to proceed.   Stephan Minister Nora, [facility] 09/25/2022

## 2022-09-25 NOTE — Plan of Care (Signed)
  Problem: Depression CCP Problem  1 MDD Goal: LTG: Reduce frequency, intensity, and duration of depression symptoms as evidenced by PHQ9 score of 5 or less  Outcome: Progressing Goal: STG: Debhora will decrease sxs of depression AEB development of x 5 effective coping skills with ability to reframe maladaptive thinking patterns 7/7 days within the next 6 months Outcome: Progressing Goal: STG: Neta will decrease sxs of depression AEB engagement in enjoyable activities 3/7 days weekly within the next 6 months  Outcome: Progressing   Problem: Anxiety Disorder CCP Problem  1 GAD Goal: LTG: Patient will score less than 5 on the Generalized Anxiety Disorder 7 Scale (GAD-7) Outcome: Progressing Goal: STG:Sweet will decrease sxs of anxiety AEB ability to engage in relaxation and grounding coping skills daily as needed within the next 6 months  Outcome: Progressing   

## 2022-09-26 NOTE — Addendum Note (Signed)
Addended by: Tia Masker on: 09/26/2022 05:14 PM   Modules accepted: Level of Service

## 2022-10-15 ENCOUNTER — Ambulatory Visit (HOSPITAL_COMMUNITY): Payer: Medicaid Other | Admitting: Mental Health
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
## 2022-11-06 ENCOUNTER — Encounter (HOSPITAL_COMMUNITY): Payer: Self-pay | Admitting: Student

## 2022-11-06 ENCOUNTER — Ambulatory Visit (INDEPENDENT_AMBULATORY_CARE_PROVIDER_SITE_OTHER): Payer: Medicaid Other | Admitting: Student

## 2022-11-06 DIAGNOSIS — F331 Major depressive disorder, recurrent, moderate: Secondary | ICD-10-CM

## 2022-11-06 MED ORDER — MIRTAZAPINE 7.5 MG PO TABS: 7.5000 mg | ORAL_TABLET | Freq: Every day | ORAL | 0 refills | Status: DC

## 2022-11-06 MED ORDER — DULOXETINE HCL 60 MG PO CPEP: 60.0000 mg | ORAL_CAPSULE | Freq: Every day | ORAL | 0 refills | Status: DC

## 2022-11-06 MED ORDER — DULOXETINE HCL 30 MG PO CPEP: 30.0000 mg | ORAL_CAPSULE | Freq: Every day | ORAL | 0 refills | Status: DC

## 2022-11-14 ENCOUNTER — Ambulatory Visit (INDEPENDENT_AMBULATORY_CARE_PROVIDER_SITE_OTHER): Payer: Medicaid Other | Admitting: Mental Health

## 2022-11-14 DIAGNOSIS — F322 Major depressive disorder, single episode, severe without psychotic features: Secondary | ICD-10-CM

## 2022-11-14 DIAGNOSIS — F4310 Post-traumatic stress disorder, unspecified: Secondary | ICD-10-CM

## 2022-11-14 DIAGNOSIS — F411 Generalized anxiety disorder: Secondary | ICD-10-CM

## 2022-11-14 NOTE — Progress Notes (Signed)
   THERAPIST PROGRESS NOTE  Session Time: 11:01am ( 47 minutes)   Participation Level: Active  Behavioral Response: CasualAlertadequate  Type of Therapy: Individual Therapy  Treatment Goals addressed: STG: Veronica [name] will decrease sxs of depression AEB development of x 5 effective coping skills with ability to reframe maladaptive thinking patterns 7/7 days within the next 6 months   STG: Veronica [name] will decrease sxs of depression AEB engagement in enjoyable activities 3/7 days weekly within the next 6 months    [name]:[name] will decrease sxs of anxiety AEB ability to engage in relaxation and grounding coping skills daily as needed within the next 6 months   ProgressTowards Goals: Progressing  Interventions: CBT and Supportive  Summary: Veronica [name] is a 61 y.o. female who presents with hx of dx of depression and generalized anxiety. Veronica [name] presents alert and oriented; mood and affect adequate; appropriate to situation. Speech clear and coherent at normal rate and tone. Engaged and receptive to intervention. Shares chief concern for low mood, low energy. Shares difficulty sleeping previous night; denies concern for excess anxiety effecting sleeping patterns. Reports event in which she was able to express emotion and cry with various familial deaths to have occurred in last few months. Reports thoughts on husband and exploration of exploring ability to live together in obtain larger place. Shares working to take care of self and engage in coping skills and monitoring self-care vs isolation. Reports increase in relationship with mother and plans to engage with mother later today. Shares working to manage moods; medication complaint. Denies safety concerns. Progress with goals; some regression in sxs but able to work to increase ability to manage sxs. Denies SI/HI  Suicidal/Homicidal: Nowithout intent/plan  Therapist Response: Therapist engaged Veronica [name] in therapy session. Assessed for current level of  functioning, sxs management and level of stressors. Provided support and encouragement validated feelings and provided supportive feedback. Supported Veronica [name] in processing thoughts and feelings related to recent low moods lack of energy and difficulty sleeping. Explored for changes and ability to engage in relaxation as needed and engagement in enjoyable activities. Provided support and supported in processing engagement with mother. Active empathic listening. Encouraged Veronica [name] to validate feelings and working engage in healthy habits. Reviewed session and provided follow up.   Plan: Return again in  x 3 weeks.  Diagnosis: Severe major depression without psychotic features ([place])  Post traumatic stress disorder  Generalized anxiety disorder  Collaboration of Care: Other None  Patient/Guardian was advised Release of Information must be obtained prior to any record release in order to collaborate their care with an outside provider. Patient/Guardian was advised if they have not already done so to contact the registration department to sign all necessary forms in order for [place] to release information regarding their care.   Consent: Patient/Guardian gives verbal consent for treatment and assignment of benefits for services provided during this visit. Patient/Guardian expressed understanding and agreed to proceed.   Veronica [name] [place], [facility] 11/14/2022

## 2022-12-11 ENCOUNTER — Ambulatory Visit (INDEPENDENT_AMBULATORY_CARE_PROVIDER_SITE_OTHER): Payer: Medicaid Other | Admitting: Mental Health

## 2022-12-11 DIAGNOSIS — F322 Major depressive disorder, single episode, severe without psychotic features: Secondary | ICD-10-CM

## 2022-12-11 DIAGNOSIS — F411 Generalized anxiety disorder: Secondary | ICD-10-CM

## 2022-12-11 DIAGNOSIS — F4310 Post-traumatic stress disorder, unspecified: Secondary | ICD-10-CM
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
## 2023-01-01 ENCOUNTER — Ambulatory Visit (INDEPENDENT_AMBULATORY_CARE_PROVIDER_SITE_OTHER): Payer: Medicaid Other | Admitting: Student

## 2023-01-01 VITALS — BP 130/65 | HR 80 | Wt 219.0 lb

## 2023-01-01 DIAGNOSIS — F331 Major depressive disorder, recurrent, moderate: Secondary | ICD-10-CM

## 2023-01-01 DIAGNOSIS — F4310 Post-traumatic stress disorder, unspecified: Secondary | ICD-10-CM

## 2023-01-01 DIAGNOSIS — F411 Generalized anxiety disorder: Secondary | ICD-10-CM

## 2023-01-01 DIAGNOSIS — F322 Major depressive disorder, single episode, severe without psychotic features: Secondary | ICD-10-CM

## 2023-01-01 MED ORDER — DULOXETINE HCL 30 MG PO CPEP: 30.0000 mg | ORAL_CAPSULE | Freq: Every day | ORAL | 0 refills | Status: DC

## 2023-01-01 MED ORDER — DULOXETINE HCL 60 MG PO CPEP: 60.0000 mg | ORAL_CAPSULE | Freq: Every day | ORAL | 0 refills | Status: DC

## 2023-01-01 NOTE — Progress Notes (Signed)
[facility] MD Outpatient Progress Note  01/01/2023 3:39 PM Veronica [name]  MRN:  [id number]  Assessment:  Veronica [name] presents for follow-up evaluation in-person. Today, 01/01/23, patient reports feeling better in terms of mood and anxiety. She feels despite having a few stressors, she is able to manage them and not allow them to strongly shift her mood in a negative respect. She feels she has been doing much better in terms of mood symptoms and sleeping has improved so we will continue current medication regiment but discontinue mirtazapine as she has not been taking. Patient follow up in 2 months.  Identifying Information: Veronica [name] is a 62 y.o. y.o. female with a history of MDD, GAD, PTSD, fibromyalgia who presents in person to [facility] for medication management.    Plan: # Major Depressive Disorder Past medication trials: Prozac (memory difficulties, ineffective) Status of problem: improving Interventions: -- Continue duloxetine 90 mg daily for depression and anxiety             -CMP reviewed from 04/24/22, Na 140, AST/ALT wnl, eGFR>90 -- Discontinued remeron 7.5 mg as she has not taken it  -- Continue therapy with Rockey Situ (next appointment 11/14/21)   # Generalized Anxiety Disorder Past medication trials:  Status of problem: stable Interventions: -- Duloxetine as above   # PTSD Past medication trials:  Status of problem: stable Interventions: -- Duloxetine as above  Patient was given contact information for behavioral health clinic and was instructed to call 911 for emergencies.   Subjective:  Chief Complaint:  Chief Complaint  Patient presents with   Anxiety   Depression    Interval History:  Patient reports having some stress related to death in family and social anxiety but overall feels that she is doing better than before.  She reports that she is working to [employer] with husband.  She still takes husband to many of  his appointments as well as her own but feels that she is doing much better regarding time management as well as preventing stressors from affecting her day-to-day.  She reports she is sleeping much better after starting 18 with mushrooms in it as well as coffee.  She has not been taking the mirtazapine more than twice.   She reports that overall despite having multiple negative stressors, she was able to manage herself as well as her mood more appropriately and not allowed to affect her day-to-day sleep schedule including having good sleep hygiene.  She states her mom has also been opening up to her and spending more time communicating with patient says she feels that this has little drastic improvement compared to before.  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  She denies present SI/HI/AVH.  She is agreeable to continuing Cymbalta at 90 mg and discontinue mirtazapine.  Visit Diagnosis:    ICD-10-CM   1. Severe major depression without psychotic features ([place])  F32.2     2. Generalized anxiety disorder  F41.1     3. Post traumatic stress disorder  F43.10     4. Moderate episode of recurrent major depressive disorder (HCC)  F33.1 DULoxetine (CYMBALTA) 60 MG capsule    DULoxetine (CYMBALTA) 30 MG capsule       Past Medical History:  Past Medical History:  Diagnosis Date   Anxiety    Arthritis    Coronary artery disease    nonobstructive by  2010 cath   Cough    Depression    Diabetes mellitus    GERD (gastroesophageal reflux disease)    years ago   H/O diverticulitis of colon    Heart attack ([place]) 08/2009   Neuropathy    OSA (obstructive sleep apnea)    pt denies    Pneumonia    Sciatica     Past Surgical History:  Procedure Laterality Date   COLONOSCOPY     NASAL RECONSTRUCTION     ORIF PATELLA Left 10/09/2017   Procedure: PATELLA TENDON REPAIR;  Surgeon: Nicholes Stairs, MD;  Location: [facility];   Service: Orthopedics;  Laterality: Left;  120 MINS   PARTIAL COLECTOMY     PATELLAR TENDON REPAIR Left 10/09/2017   TONSILLECTOMY AND ADENOIDECTOMY     UVULOPALATOPHARYNGOPLASTY       Family History:  Family History  Problem Relation Age of Onset   Diabetes Mother        type 2   Diabetes Other        cousin type 1   Diabetes Brother    Congestive Heart Failure Brother    Emphysema Paternal Grandfather    Tuberculosis Paternal Grandfather     Social History:  Social History   Socioeconomic History   Marital status: Legally Separated    Spouse name: Not on file   Number of children: Not on file   Years of education: Not on file   Highest education level: Not on file  Occupational History   Not on file  Tobacco Use   Smoking status: Former    Years: 4.00    Types: Cigarettes    Quit date: 10/28/1987    Years since quitting: 35.2   Smokeless tobacco: Never   Tobacco comments:    1 pack per 2 months  Vaping Use   Vaping Use: Never used  Substance and Sexual Activity   Alcohol use: No    Alcohol/week: 0.0 standard drinks of alcohol   Drug use: No   Sexual activity: Not on file  Other Topics Concern   Not on file  Social History Narrative   Originally from [place]. Always lived in [place]. Prior travel to [place], [place], [place], & [place]. Currently works in a call center. 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 She has no pets currently. No bird or hot tub exposure. She reports that there has been mold in her basement before that was treated twice. She also has mold in her bathroom & bedroom.    Social Determinants of Health   Financial Resource Strain: Medium Risk (06/16/2022)   Overall Financial Resource Strain (CARDIA)    Difficulty of Paying Living Expenses: Somewhat hard  Food Insecurity: No Food Insecurity (06/16/2022)   Hunger Vital Sign    Worried About Running Out of Food in the Last Year: Never true    Ran Out of Food in the Last Year: Never  true  Transportation Needs: No Transportation Needs (06/16/2022)   PRAPARE - Hydrologist (Medical): No    Lack of Transportation (Non-Medical): No  Physical Activity: Inactive (06/16/2022)   Exercise Vital Sign    Days of Exercise per Week: 0 days    Minutes of Exercise per Session: 0 min  Stress: Stress Concern Present (06/16/2022)   Vin[place]    Feeling of Stress : Very much  Social Connections: Moderately Isolated (06/16/2022)  Social Licensed conveyancer [NHANES]    Frequency of Communication with Friends and Family: Twice a week    Frequency of Social Gatherings with Friends and Family: Twice a week    Attends Religious Services: More than 4 times per year    Active Member of [employer] or Organizations: No    Attends Archivist Meetings: Never    Marital Status: Separated    Allergies: No Known Allergies  Current Medications: Current Outpatient Medications  Medication Sig Dispense Refill   albuterol (VENTOLIN HFA) 108 (90 Base) MCG/ACT inhaler Inhale 2 puffs into the lungs every 4 (four) hours as needed for wheezing or shortness of breath.     aspirin 81 MG chewable tablet Chew 81 mg by mouth daily.     cyclobenzaprine (FLEXERIL) 10 MG tablet Take 10 mg by mouth daily as needed for muscle spasms.     DULoxetine (CYMBALTA) 30 MG capsule Take 1 capsule (30 mg total) by mouth daily. Take with 60 mg capsule for a total of 90 mg of duloxetine 60 capsule 0   DULoxetine (CYMBALTA) 60 MG capsule Take 1 capsule (60 mg total) by mouth daily. 60 capsule 0   furosemide (LASIX) 20 MG tablet Take 20 mg by mouth daily.     gabapentin (NEURONTIN) 100 MG capsule Take 100 mg by mouth 2 (two) times daily.     insulin lispro (HUMALOG) 100 UNIT/ML injection Inject 2-12 Units into the skin 3 (three) times daily before meals. 0-200 Zero units 201-250 Two units 251-300 Four units 301-350 Six  units 351-400 Eight units 401-450 Ten units 451-500 Twelve units 501+ call MD (Patient not taking: Reported on 07/03/2022)     insulin NPH Human (HUMULIN N,NOVOLIN N) 100 UNIT/ML injection Inject 27 Units into the skin 2 (two) times daily before a meal.  (Patient not taking: Reported on 07/03/2022)     Melatonin 10 MG TABS Take 1 tablet by mouth at bedtime. (Patient not taking: Reported on 07/03/2022)     Menthol, Topical Analgesic, (BIOFREEZE) 4 % GEL Apply 1 application topically 2 (two) times daily as needed (pain in lower legs/hands). 5% (Patient not taking: Reported on 07/03/2022)     metoprolol tartrate (LOPRESSOR) 25 MG tablet Take 12.5 mg by mouth daily.     mirtazapine (REMERON) 7.5 MG tablet Take 1 tablet (7.5 mg total) by mouth at bedtime. 60 tablet 0   No current facility-administered medications for this visit.     Objective:  Psychiatric Specialty Exam: Blood pressure 130/65, pulse 80, weight 219 lb (99.3 kg).Body mass index is 36.44 kg/m.  General Appearance: Well Groomed  Eye Contact:  Good  Speech:  Clear and Coherent and Normal Rate  Volume:  Normal  Mood:  Euthymic  Affect:  Appropriate and Congruent  Thought Process:  Coherent, Goal Directed, and Linear  Orientation:  Full (Time, Place, and Person)  Thought Content: Logical   Suicidal Thoughts:  No  Homicidal Thoughts:  No  Memory:  Remote;   Good  Judgment:  Good  Insight:  Good  Psychomotor Activity:  Normal  Concentration:  Concentration: Good and Attention Span: Good              Assets:  Communication Skills Desire for Improvement Financial Resources/Insurance Housing Intimacy Leisure Time Physical Health Resilience Social Support Talents/Skills Transportation Vocational/Educational  ADL's:  Intact  Cognition: WNL  Sleep:  Good   PE: General: well-appearing; no acute distress  Pulm: no increased work of breathing on  room air  Strength & Muscle Tone: within normal limits Neuro: no focal  neurological deficits observed  Gait & Station: normal  Metabolic Disorder Labs: Lab Results  Component Value Date   HGBA1C 10.3 (H) 11/15/2016   MPG 249 11/15/2016   MPG 243 11/14/2016   No results found for: "PROLACTIN" Lab Results  Component Value Date   CHOL  08/31/2009    189        ATP III CLASSIFICATION:  <200     mg/dL   Desirable  200-239  mg/dL   Borderline High  >=240    mg/dL   High          TRIG 59 08/31/2009   HDL 59 08/31/2009   CHOLHDL 3.2 08/31/2009   VLDL 12 08/31/2009   LDLCALC (H) 08/31/2009    118        Total Cholesterol/HDL:CHD Risk Coronary Heart Disease Risk Table                     Men   Women  1/2 Average Risk   3.4   3.3  Average Risk       5.0   4.4  2 X Average Risk   9.6   7.1  3 X Average Risk  23.4   11.0        Use the calculated Patient Ratio above and the CHD Risk Table to determine the patient's CHD Risk.        ATP III CLASSIFICATION (LDL):  <100     mg/dL   Optimal  100-129  mg/dL   Near or Above                    Optimal  130-159  mg/dL   Borderline  160-189  mg/dL   High  >190     mg/dL   Very High   LDLCALC 118 (H) 06/26/2009     Therapeutic Level Labs: No results found for: "LITHIUM" No results found for: "VALPROATE" No results found for: "CBMZ"  Screenings: GAD-7    Flowsheet Row Counselor from [facility]/16/2022 in [facility]  Total GAD-7 Score 20      PHQ2-9    Flowsheet Row Counselor [facility]  PHQ-2 Total Score 6 4  PHQ-9 Total Score 16 15      Flowsheet Row Counselor from 06/16/2022 in [facility] No Risk       Collaboration of Care: Collaboration of Care:   Patient/Guardian was advised Release of Information must be obtained prior to any record release in order to collaborate their care with an outside  provider. Patient/Guardian was advised if they have not already done so to contact the registration department to sign all necessary forms in order for [place] to release information regarding their care.   Consent: Patient/Guardian gives verbal consent for treatment and assignment of benefits for services provided during this visit. Patient/Guardian expressed understanding and agreed to proceed.   A total of 35 minutes was spent involved in face to face clinical care, chart review, and documentation.   France Ravens, MD 01/01/2023, 3:39 PM

## 2023-01-15 ENCOUNTER — Ambulatory Visit (INDEPENDENT_AMBULATORY_CARE_PROVIDER_SITE_OTHER): Payer: Medicaid Other | Admitting: Mental Health

## 2023-01-15 ENCOUNTER — Telehealth (HOSPITAL_COMMUNITY): Payer: Self-pay | Admitting: Mental Health

## 2023-01-15 DIAGNOSIS — F4310 Post-traumatic stress disorder, unspecified: Secondary | ICD-10-CM

## 2023-01-15 DIAGNOSIS — F322 Major depressive disorder, single episode, severe without psychotic features: Secondary | ICD-10-CM | POA: Diagnosis not present

## 2023-01-15 NOTE — Telephone Encounter (Signed)
Therapist called to follow up on no show appointment for 01/15/23. No answer; straight to voicemail. Left message to contact office to reschedule.

## 2023-01-15 NOTE — Progress Notes (Signed)
   THERAPIST PROGRESS NOTE  Session Time: 2:00 pm ( 25 minutes)  Participation Level: Active  Behavioral Response: CasualAlertneutral  Type of Therapy: Individual Therapy  Treatment Goals addressed: STG: Airam will decrease sxs of depression AEB development of x 5 effective coping skills with ability to reframe maladaptive thinking patterns 7/7 days within the next 6 months   STG: Bernina will decrease sxs of depression AEB engagement in enjoyable activities 3/7 days weekly within the next 6 months   [id number] will decrease sxs of anxiety AEB ability to engage in relaxation and grounding coping skills daily as needed within the next 6 months     ProgressTowards Goals: Progressing  Interventions: Supportive  Summary: LIANA DEENER is a 62 y.o. female who presents with hx of dx of depression and generalized anxiety. Tayha presents alert and oriented; mood and affect adequate; appropriate to situation. Speech clear and coherent at normal rate and tone. Shares to have been late for appointment due to phone to have broken. Shares for aunt to be in hospice and working to be a support to mother. Shares for mother to have been engaging with her to a higher degree and asking for a multitude of things. Shares working to support others while working to support herself. Shares feelings of fatigue and able to identify time to slow down and engage in self-care for herself. Shares concerns with husband and inability to qualify for needs. Agrees to explore income based apartments. Denies safety concerns. Adequate progress with goals; some sxs regression.   Suicidal/Homicidal: Nowithout intent/plan  Therapist Response: Therapist engaged [place] in therapy session. Assessed for current level of functioning, sxs management and level of stressors. Provided support and encouragement validated feelings and provided supportive feedback. Supported Chartered loss adjuster in processing thoughts and feelings related to recent  stressors and ability to navigate; cope and engaging in self-care and coping skills for herself. Encouraged to follow up with Medicaid cornering denial and follow up with DSS in regards to low income housing for seniors. Reviewed session and provided follow up.   Plan: Return again in  x 4 weeks.  Diagnosis: Severe major depression without psychotic features ([place])  Post traumatic stress disorder  Collaboration of Care: Other None  Patient/Guardian was advised Release of Information must be obtained prior to any record release in order to collaborate their care with an outside provider. Patient/Guardian was advised if they have not already done so to contact the registration department to sign all necessary forms in order for [place] to release information regarding their care.   Consent: Patient/Guardian gives verbal consent for treatment and assignment of benefits for services provided during this visit. Patient/Guardian expressed understanding and agreed to proceed.   Rockey Situ [place], [facility] 01/19/2023

## 2023-02-19 ENCOUNTER — Ambulatory Visit (INDEPENDENT_AMBULATORY_CARE_PROVIDER_SITE_OTHER): Payer: Medicaid Other | Admitting: Mental Health

## 2023-02-19 DIAGNOSIS — F4310 Post-traumatic stress disorder, unspecified: Secondary | ICD-10-CM

## 2023-02-19 DIAGNOSIS — F322 Major depressive disorder, single episode, severe without psychotic features: Secondary | ICD-10-CM

## 2023-02-19 DIAGNOSIS — F411 Generalized anxiety disorder: Secondary | ICD-10-CM

## 2023-02-19 NOTE — Progress Notes (Signed)
   THERAPIST PROGRESS NOTE  Session Time: 1:33pm ( 54 minutes)  Participation Level: Active  Behavioral Response: CasualAlertEuthymic  Type of Therapy: Individual Therapy  Treatment Goals addressed: STG: Veronica [name] will decrease sxs of depression AEB development of x 5 effective coping skills with ability to reframe maladaptive thinking patterns 7/7 days within the next 6 months   STG: Veronica [name] will decrease sxs of depression AEB engagement in enjoyable activities 3/7 days weekly within the next 6 months    [name] will decrease sxs of anxiety AEB ability to engage in relaxation and grounding coping skills daily as needed within the next 6 months   ProgressTowards Goals: Progressing  Interventions: CBT and Supportive  Summary: Veronica [name] is a 62 y.o. female who presents with hx of dx of depression and generalized anxiety. Veronica [name] presents alert and oriented; mood and affect adequate; appropriate to situation. Speech clear and coherent at normal rate and tone. Shares to have been doing well and for sxs to be stable at this time. Denies concerns for depression or anxiety at this time. Shares for passing in family (aunt) notes to feel as if she is coping with aunt to have lived to be 2 years of age. Notes for husband and self to be exploring ongoingly living together and perhaps working on their marriage. Notes engagement in healthy habits although appetite has declined; sleep continues to be variable at times. Notes ability to relax as needed and setting boundaries. Shares concern for family members to have spoken ill of her in the past but as able to engage with a family member during aunt's funeral and discuss. Notes improvement in mood. GAD: 13 PHQ: 14.   Suicidal/Homicidal: Nowithout intent/plan  [name] in therapy session. Assessed for current level of functioning, sxs management and level of stressors. Provided support and encouragement validated  feelings and provided supportive feedback. Supported Veronica [name] in processing thoughts concerning aunts passing and abiity to navigate feelings of grief and acceptance of aunts passing. Supported what she feels is best for her and husband with exploration of marriage. Assessed for feelings of depression and anxiety and factors that have supported in increase in mood. Discussed PHQ and GAD scores and explored areas for ongoing progress. Encouraged to continue to monitor thoughts for balanced thinking, engagement in things in which she enjoys and maintaining boundaries with others and self.   Plan: Return again in x 4 weeks.  Diagnosis: Severe major depression without psychotic features  Post traumatic stress disorder  Generalized anxiety disorder  Collaboration of Care: Other None  Patient/Guardian was advised Release of Information must be obtained prior to any record release in order to collaborate their care with an outside provider. Patient/Guardian was advised if they have not already done so to contact the registration department to sign all necessary forms in order for [place] to release information regarding their care.   Consent: Patient/Guardian gives verbal consent for treatment and assignment of benefits for services provided during this visit. Patient/Guardian expressed understanding and agreed to proceed.   [name], [facility] 02/19/2023

## 2023-03-05 ENCOUNTER — Encounter (HOSPITAL_COMMUNITY): Payer: Medicaid Other | Admitting: Student

## 2023-03-05 NOTE — Progress Notes (Deleted)
BH MD Outpatient Progress Note  03/05/2023 2:00 PM Veronica [name]  MRN:  [id number]  Assessment:  Veronica [name] presents for follow-up evaluation in-person. Today, 03/05/23, patient reports ***  Identifying Information: Veronica [name] is a 62 y.o. y.o. female with a history of MDD, GAD, PTSD, fibromyalgia who presents in person to [facility] for medication management.    Plan: Plan: # Major Depressive Disorder Past medication trials: Prozac (memory difficulties, ineffective), mirtazapine Status of problem: improving Interventions: -- Continue duloxetine 90 mg daily for depression and anxiety             -CMP reviewed from 04/24/22, Na 140, AST/ALT wnl, eGFR>90 -- Continue therapy with Veronica [name] (next appointment 04/02/23)   # Generalized Anxiety Disorder Past medication trials:  Status of problem: stable Interventions: -- Duloxetine as above   # PTSD Past medication trials:  Status of problem: stable Interventions: -- Duloxetine as above  Patient was given contact information for behavioral health clinic and was instructed to call 911 for emergencies.   Subjective:  Chief Complaint: No chief complaint on file.   Interval History: ***  Visit Diagnosis: No diagnosis found.  Past Psychiatric History: ***  Past Medical History:  Past Medical History:  Diagnosis Date   Anxiety    Arthritis    Coronary artery disease    nonobstructive by 2010 cath   Cough    Depression    Diabetes mellitus    GERD (gastroesophageal reflux disease)    years ago   H/O diverticulitis of colon    Heart attack (HCC) 08/2009   Neuropathy    OSA (obstructive sleep apnea)    pt denies    Pneumonia    Sciatica     Past Surgical History:  Procedure Laterality Date   COLONOSCOPY     NASAL RECONSTRUCTION     ORIF PATELLA Left 10/09/2017   Procedure: PATELLA TENDON REPAIR;  Surgeon: Yolonda Kida, MD;  Location: [facility] OR;  Service: Orthopedics;   Laterality: Left;  120 MINS   PARTIAL COLECTOMY     PATELLAR TENDON REPAIR Left 10/09/2017   TONSILLECTOMY AND ADENOIDECTOMY     UVULOPALATOPHARYNGOPLASTY      Family History:  Family History  Problem Relation Age of Onset   Diabetes Mother        type 2   Diabetes Other        cousin type 1   Diabetes Brother    Congestive Heart Failure Brother    Emphysema Paternal Grandfather    Tuberculosis Paternal Grandfather     Social History:  Social History   Socioeconomic History   Marital status: Legally Separated    Spouse name: Not on file   Number of children: Not on file   Years of education: Not on file   Highest education level: Not on file  Occupational History   Not on file  Tobacco Use   Smoking status: Former    Years: 4    Types: Cigarettes    Quit date: 10/28/1987    Years since quitting: 35.3   Smokeless tobacco: Never   Tobacco comments:    1 pack per 2 months  Vaping Use   Vaping Use: Never used  Substance and Sexual Activity   Alcohol use: No    Alcohol/week: 0.0 standard drinks of alcohol   Drug use: No   Sexual activity: Not on file  Other Topics Concern   Not on file  Social History  Narrative   Originally from [place]. Always lived in [place]. [place] travel to [place]. 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 She has no pets currently. No bird or hot tub exposure. She reports that there has been mold in her basement before that was treated twice. She also has mold in her bathroom & bedroom.    Social Determinants of Health   Financial Resource Strain: Medium Risk (06/16/2022)   Overall Financial Resource Strain (CARDIA)    Difficulty of Paying Living Expenses: Somewhat hard  Food Insecurity: No Food Insecurity (06/16/2022)   Hunger Vital Sign    Worried About Running Out of Food in the Last Year: Never true    Ran Out of Food in the Last Year: Never true  Transportation Needs: No  Transportation Needs (06/16/2022)   PRAPARE - Administrator, Civil Service (Medical): No    Lack of Transportation (Non-Medical): No  Physical Activity: Inactive (06/16/2022)   Exercise Vital Sign    Days of Exercise per Week: 0 days    Minutes of Exercise per Session: 0 min  Stress: Stress Concern Present (06/16/2022)   [employer] of Occupational Health - Occupational Stress Questionnaire    Feeling of Stress : Very much  Social Connections: Moderately Isolated (06/16/2022)   Social Connection and Isolation Panel [NHANES]    Frequency of Communication with Friends and Family: Twice a week    Frequency of Social Gatherings with Friends and Family: Twice a week    Attends Religious Services: More than 4 times per year    Active Member of [employer] or Organizations: No    Attends Banker Meetings: Never    Marital Status: Separated    Allergies: No Known Allergies  Current Medications: Current Outpatient Medications  Medication Sig Dispense Refill   albuterol (VENTOLIN HFA) 108 (90 Base) MCG/ACT inhaler Inhale 2 puffs into the lungs every 4 (four) hours as needed for wheezing or shortness of breath.     aspirin 81 MG chewable tablet Chew 81 mg by mouth daily.     cyclobenzaprine (FLEXERIL) 10 MG tablet Take 10 mg by mouth daily as needed for muscle spasms.     DULoxetine (CYMBALTA) 30 MG capsule Take 1 capsule (30 mg total) by mouth daily. Take with 60 mg capsule for a total of 90 mg of duloxetine 60 capsule 0   DULoxetine (CYMBALTA) 60 MG capsule Take 1 capsule (60 mg total) by mouth daily. 60 capsule 0   furosemide (LASIX) 20 MG tablet Take 20 mg by mouth daily.     gabapentin (NEURONTIN) 100 MG capsule Take 100 mg by mouth 2 (two) times daily.     insulin lispro (HUMALOG) 100 UNIT/ML injection Inject 2-12 Units into the skin 3 (three) times daily before meals. 0-200 Zero units 201-250 Two units 251-300 Four units 301-350 Six units 351-400 Eight  units 401-450 Ten units 451-500 Twelve units 501+ call MD (Patient not taking: Reported on 07/03/2022)     insulin NPH Human (HUMULIN N,NOVOLIN N) 100 UNIT/ML injection Inject 27 Units into the skin 2 (two) times daily before a meal.  (Patient not taking: Reported on 07/03/2022)     Melatonin 10 MG TABS Take 1 tablet by mouth at bedtime. (Patient not taking: Reported on 07/03/2022)     Menthol, Topical Analgesic, (BIOFREEZE) 4 % GEL Apply 1 application topically 2 (two) times daily as needed (pain  in lower legs/hands). 5% (Patient not taking: Reported on 07/03/2022)     metoprolol tartrate (LOPRESSOR) 25 MG tablet Take 12.5 mg by mouth daily.     No current facility-administered medications for this visit.    ROS: Review of Systems  Objective:  Psychiatric Specialty Exam: There were no vitals taken for this visit.There is no height or weight on file to calculate BMI.  General Appearance: {Appearance:22683}  Eye Contact:  {BHH EYE CONTACT:22684}  Speech:  {Speech:22685}  Volume:  {Volume (PAA):22686}  Mood:  {BHH MOOD:22306}  Affect:  {Affect (PAA):22687}  Thought Process:  {Thought Process (PAA):22688}  Orientation:  {BHH ORIENTATION (PAA):22689}  Thought Content: {Thought Content:22690}   Suicidal Thoughts:  {ST/HT (PAA):22692}  Homicidal Thoughts:  {ST/HT (PAA):22692}  Memory:  {BHH ZOXWRU:04540}  Judgment:  {Judgement (PAA):22694}  Insight:  {Insight (PAA):22695}  Psychomotor Activity:  {Psychomotor (PAA):22696}  Concentration:  {Concentration:21399}              Assets:  {Assets (PAA):22698}  ADL's:  {BHH JWJ'X:91478}  Cognition: {chl bhh cognition:304700322}  Sleep:  {BHH GOOD/FAIR/POOR:22877}   PE: General: well-appearing; no acute distress  Pulm: no increased work of breathing on room air  Strength & Muscle Tone: {desc; muscle tone:32375} Neuro: no focal neurological deficits observed  Gait & Station: {PE GAIT ED GNFA:21308}  Metabolic Disorder Labs: Lab Results   Component Value Date   HGBA1C 10.3 (H) 11/15/2016   MPG 249 11/15/2016   MPG 243 11/14/2016   No results found for: "PROLACTIN" Lab Results  Component Value Date   CHOL  08/31/2009    189        ATP III CLASSIFICATION:  <200     mg/dL   Desirable  657-846  mg/dL   Borderline High  >=962    mg/dL   High          TRIG 59 08/31/2009   HDL 59 08/31/2009   CHOLHDL 3.2 08/31/2009   VLDL 12 08/31/2009   LDLCALC (H) 08/31/2009    118        Total Cholesterol/HDL:CHD Risk Coronary Heart Disease Risk Table                     Men   Women  1/2 Average Risk   3.4   3.3  Average Risk       5.0   4.4  2 X Average Risk   9.6   7.1  3 X Average Risk  23.4   11.0        Use the calculated Patient Ratio above and the CHD Risk Table to determine the patient's CHD Risk.        ATP III CLASSIFICATION (LDL):  <100     mg/dL   Optimal  952-841  mg/dL   Near or Above                    Optimal  130-159  mg/dL   Borderline  324-401  mg/dL   High  >027     mg/dL   Very High   LDLCALC 253 (H) 06/26/2009   Lab Results  Component Value Date   TSH 0.340 (L) 11/14/2016   TSH 1.391 ***Test methodology is 3rd generation TSH*** 08/31/2009    Therapeutic Level Labs: No results found for: "LITHIUM" No results found for: "VALPROATE" No results found for: "CBMZ"  Screenings: GAD-7    Flowsheet Row Counselor from 02/19/2023 in [facility] Counselor  from 06/16/2022 in [facility]  Total GAD-7 Score 13 20      PHQ2-9    Flowsheet Row Counselor from 02/19/2023 in [facility] Counselor [facility] 06/16/2022 in [facility]  PHQ-2 Total Score 3 6 4   PHQ-9 Total Score 14 16 15       Flowsheet Row Counselor from 06/16/2022 in [facility]  C-SSRS RISK CATEGORY No Risk       Collaboration of  Care: Collaboration of Care: [facility] OP Collaboration of ZOXW:[id number]}  Patient/Guardian was advised Release of Information must be obtained prior to any record release in order to collaborate their care with an outside provider. Patient/Guardian was advised if they have not already done so to contact the registration department to sign all necessary forms in order for [place] to release information regarding their care.   Consent: Patient/Guardian gives verbal consent for treatment and assignment of benefits for services provided during this visit. Patient/Guardian expressed understanding and agreed to proceed.   A total of *** minutes was spent involved in face to face clinical care, chart review, and documentation.   Park Pope, MD 03/05/2023, 2:00 PM

## 2023-04-02 ENCOUNTER — Ambulatory Visit (INDEPENDENT_AMBULATORY_CARE_PROVIDER_SITE_OTHER): Payer: Medicaid Other | Admitting: Mental Health

## 2023-04-02 DIAGNOSIS — F4310 Post-traumatic stress disorder, unspecified: Secondary | ICD-10-CM

## 2023-04-02 DIAGNOSIS — F322 Major depressive disorder, single episode, severe without psychotic features: Secondary | ICD-10-CM

## 2023-04-02 DIAGNOSIS — F411 Generalized anxiety disorder: Secondary | ICD-10-CM

## 2023-04-02 NOTE — Progress Notes (Signed)
THERAPIST PROGRESS NOTE Virtual Visit via Video Note  I connected with Veronica [name] on 04/02/23 at  2:00 PM EDT by a video enabled telemedicine application and verified that I am speaking with the correct person using two identifiers.  Location: Patient: home address on file Provider: office    I discussed the limitations of evaluation and management by telemedicine and the availability of in person appointments. The patient expressed understanding and agreed to proceed.  I discussed the assessment and treatment plan with the patient. The patient was provided an opportunity to ask questions and all were answered. The patient agreed with the plan and demonstrated an understanding of the instructions.   The patient was advised to call back or seek an in-person evaluation if the symptoms worsen or if the condition fails to improve as anticipated.  I provided 17 minutes of non-face-to-face time during this encounter.   Dorris Singh, [facility]   Session Time: 2:03 pm   Participation Level: Active  Behavioral Response: CasualAlertLow  Type of Therapy: Individual Therapy  Treatment Goals addressed: STG: Malanna will decrease sxs of depression AEB development of x 5 effective coping skills with ability to reframe maladaptive thinking patterns 7/7 days within the next 6 months   STG: Veronica [name] will decrease sxs of depression AEB engagement in enjoyable activities 3/7 days weekly within the next 6 months    UEA:VWUJWJ will decrease sxs of anxiety AEB ability to engage in relaxation and grounding coping skills daily as needed within the next 6 months   ProgressTowards Goals: Progressing  Interventions: Supportive  Summary:  Veronica [name] is a 62 y.o. female who presents with hx of dx of depression and generalized anxiety. Veronica [name] presents alert and oriented; mood and affect adequate; appropriate to situation. Speech clear and coherent at normal rate and tone. Shares to feel  under the weather and shares headache for the past x 2 days. Notes concern for blood sugar being a little low. Notes If sxs  continue into next day will present for medical intervention. Notes to not be alone and for husband to be with her. Shares has been spending time with mother but has had to decline engagement today; sharing to be fearful of drinking with head pain. 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 Mood has been stable. Stability in sxs at this time; ongoing progress with goals.  Suicidal/Homicidal: Nowithout intent/plan  [name] in [employer]. Assessed for current level of functioning, sxs management and level of stressors. Provided support and encouragement validated feelings and provided supportive feedback. Active empathic listening; provided supportive feedback. Encouraged to follow up with medical provider if sxs persist and do not alleviate. Processed how medical health can effect mental health and assessed for concerns with mood. Agrees to end session prematurely due to sickness. Provided follow up; no safety concerns present.   Plan: Return again in x 4 weeks.  Diagnosis: Severe major depression without psychotic features (HCC)  Post traumatic stress disorder  Generalized anxiety disorder  Collaboration of Care: Other None  Patient/Guardian was advised Release of Information must be obtained prior to any record release in order to collaborate their care with an outside provider. Patient/Guardian was advised if they have not already done so to contact the registration department to sign all necessary forms in order for [place] to release information regarding their care.   Consent: Patient/Guardian gives verbal consent for treatment and assignment of benefits for services  provided during this visit. Patient/Guardian expressed understanding and agreed to proceed.   Stephan Minister [place], [facility] 04/02/2023

## 2023-05-14 ENCOUNTER — Ambulatory Visit (INDEPENDENT_AMBULATORY_CARE_PROVIDER_SITE_OTHER): Payer: MEDICAID | Admitting: Mental Health

## 2023-05-14 DIAGNOSIS — F322 Major depressive disorder, single episode, severe without psychotic features: Secondary | ICD-10-CM | POA: Diagnosis not present

## 2023-05-14 DIAGNOSIS — F411 Generalized anxiety disorder: Secondary | ICD-10-CM

## 2023-05-14 DIAGNOSIS — F4310 Post-traumatic stress disorder, unspecified: Secondary | ICD-10-CM

## 2023-05-14 NOTE — Progress Notes (Signed)
   THERAPIST PROGRESS NOTE  Session Time: 3: 12 pm ( 37 minutes)  Participation Level: Active  Behavioral Response: CasualAlertDysphoric  Type of Therapy: Individual Therapy  Treatment Goals addressed: STG: [name] will decrease sxs of depression AEB development of x 5 effective coping skills with ability to reframe maladaptive thinking patterns 7/7 days within the next 6 months   STG: [name] will decrease sxs of depression AEB engagement in enjoyable activities 3/7 days weekly within the next 6 months    [name] will decrease sxs of anxiety AEB ability to engage in relaxation and grounding coping skills daily as needed within the next 6 months   ProgressTowards Goals: Progressing  Interventions: Supportive  Summary:  Veronica [name] is a 62 y.o. female who presents with hx of dx of depression and generalized anxiety. [name] presents alert and oriented; mood and affect low. Speech clear and coherent at normal rate and tone. Engaged and receptive to interventions. Shares with therapist concerns for medical health with ongoing back in back and legs which has limited her ability to engage in community. Shares for this to be effecting her mood with increase in feelings of low mood and depression. Notes ongoing interaction with mother and social support within her neighborhood however shares that have expressed concern for her. Notes for leg to go numb at times and is current usually a walker/cane. Notes to spend time at home but shares attends church and other services as she is able pain willing. Notes engagement in cognitive coping and reports interaction with husband and others as source of coping and support. Shared decline in enjoyable activities with pain. Shares some worry over possible need to have back surgery but hoping to avoid. Increase in depressive sxs; ongoing work to continue progress with goals. Denies SI/HI  Suicidal/Homicidal: Nowithout intent/plan  Therapist Response:   Therapist engaged [employer] in [employer]. Assessed for current level of functioning, sxs management and level of stressors. Provided support and encouragement validated feelings and provided supportive feedback. Active empathic listening. Encouraged ongoing follow up with providers and working to engage in mindful activities to support in refocusing from thoughts of pain. Provided feedback in working with [name] program and working to gain needed information. Affirmed ability to remain in contact with supports and discouraged isolation from others. Reviewed session and provided follow up.   Plan: Return again in  x 6 weeks.  Diagnosis: Severe major depression without psychotic features (HCC)  Post traumatic stress disorder  Generalized anxiety disorder  Collaboration of Care: Other None  Patient/Guardian was advised Release of Information must be obtained prior to any record release in order to collaborate their care with an outside provider. Patient/Guardian was advised if they have not already done so to contact the registration department to sign all necessary forms in order for [place] to release information regarding their care.   Consent: Patient/Guardian gives verbal consent for treatment and assignment of benefits for services provided during this visit. Patient/Guardian expressed understanding and agreed to proceed.   [name], [facility] 05/15/2023

## 2023-07-02 ENCOUNTER — Ambulatory Visit (INDEPENDENT_AMBULATORY_CARE_PROVIDER_SITE_OTHER): Payer: MEDICAID | Admitting: Mental Health

## 2023-07-02 DIAGNOSIS — F322 Major depressive disorder, single episode, severe without psychotic features: Secondary | ICD-10-CM

## 2023-07-02 DIAGNOSIS — F4310 Post-traumatic stress disorder, unspecified: Secondary | ICD-10-CM

## 2023-07-02 DIAGNOSIS — F411 Generalized anxiety disorder: Secondary | ICD-10-CM
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
## 2023-07-11 NOTE — Progress Notes (Addendum)
BH MD Outpatient Progress Note  07/17/2023 12:53 PM Veronica [name]  MRN:  [id number]  Assessment:  Veronica [name] presents for follow-up evaluation in-person. Today, 07/17/23, patient reports feeling better in terms of mood and anxiety although has noted mild exacerbation in setting of multiple psychosocial stressors. Given situational worsening, patient would like to continue current duloxetine dose to ensure her depressive symptoms are well-controlled. Patient to follow up in ~2 months.   Identifying Information: Veronica [name] is a 62 y.o. y.o. female with a history of MDD, GAD, PTSD, fibromyalgia who presents in person to [facility] for medication management.    Plan: # Major Depressive Disorder Past medication trials: Prozac (memory difficulties, ineffective), mirtazapine (weight gain) Status of problem: improving Interventions: -- Continue duloxetine 90 mg daily for depression and anxiety             -CMP reviewed from 04/24/22, Na 140, AST/ALT wnl, eGFR>90 -- Continue therapy with Stephan Minister (next appointment 11/14/21)   # Generalized Anxiety Disorder Past medication trials:  Status of problem: stable Interventions: -- Duloxetine as above   # PTSD Past medication trials:  Status of problem: stable Interventions: -- Duloxetine as above  Patient was given contact information for behavioral health clinic and was instructed to call 911 for emergencies.   Subjective:  Chief Complaint:  No chief complaint on file.   Interval History:  Patient reports continuing to struggle with some psychosocial stressors.  She had initially done well for a few months after I saw her in March 2024.  She reports that her symptoms had significantly worsened following multiple stressors including finding significant mold all over her apartment.  She reports she has to vacate her apartment soon given the biohazard that her apartment has become.  She denies SI/HI/AVH  and continues to have religion very strong protective factor for her.  She reports that her husband has been very supportive of her which has been very beneficial.  Her appetite and sleep have varied depending on how stress she feels.  She does feel that once she has moved apartments she will feel relieved and her mood will improve.  She also reports that another factor related to moldy apartment is that many of her memorabilia have also grown mold which has led to her needing to give those up.  Visit Diagnosis:    ICD-10-CM   1. Moderate episode of recurrent major depressive disorder (HCC)  F33.1 DULoxetine (CYMBALTA) 30 MG capsule    DULoxetine (CYMBALTA) 60 MG capsule        Past Medical History:  Past Medical History:  Diagnosis Date   Anxiety    Arthritis    Coronary artery disease    nonobstructive by 2010 cath   Cough    Depression    Diabetes mellitus    GERD (gastroesophageal reflux disease)    years ago   H/O diverticulitis of colon    Heart attack (HCC) 08/2009   Neuropathy    OSA (obstructive sleep apnea)    pt denies    Pneumonia    Sciatica     Past Surgical History:  Procedure Laterality Date   COLONOSCOPY     NASAL RECONSTRUCTION     ORIF PATELLA Left 10/09/2017   Procedure: PATELLA TENDON REPAIR;  Surgeon: Yolonda Kida, MD;  Location: [facility] OR;  Service: Orthopedics;  Laterality: Left;  120 MINS   PARTIAL COLECTOMY     PATELLAR TENDON REPAIR Left 10/09/2017  TONSILLECTOMY AND ADENOIDECTOMY     UVULOPALATOPHARYNGOPLASTY       Family History:  Family History  Problem Relation Age of Onset   Diabetes Mother        type 2   Diabetes Other        cousin type 1   Diabetes Brother    Congestive Heart Failure Brother    Emphysema Paternal Grandfather    Tuberculosis Paternal Grandfather     Social History:  Social History   Socioeconomic History   Marital status: Legally Separated    Spouse name: Not on file   Number of children: Not on  file   Years of education: Not on file   Highest education level: Not on file  Occupational History   Not on file  Tobacco Use   Smoking status: Former    Current packs/day: 0.00    Types: Cigarettes    Start date: 10/28/1983    Quit date: 10/28/1987    Years since quitting: 35.7   Smokeless tobacco: Never   Tobacco comments:    1 pack per 2 months  Vaping Use   Vaping status: Never Used  Substance and Sexual Activity   Alcohol use: No    Alcohol/week: 0.0 standard drinks of alcohol   Drug use: No   Sexual activity: Not on file  Other Topics Concern   Not on file  Social History Narrative   Originally from [place]. Always lived in [place]. [place]. 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 She has no pets currently. No bird or hot tub exposure. She reports that there has been mold in her basement before that was treated twice. She also has mold in her bathroom & bedroom.    Social Determinants of Health   Financial Resource Strain: Medium Risk (06/16/2022)   Overall Financial Resource Strain (CARDIA)    Difficulty of Paying Living Expenses: Somewhat hard  Food Insecurity: No Food Insecurity (06/16/2022)   Hunger Vital Sign    Worried About Running Out of Food in the Last Year: Never true    Ran Out of Food in the Last Year: Never true  Transportation Needs: No Transportation Needs (06/16/2022)   PRAPARE - Administrator, Civil Service (Medical): No    Lack of Transportation (Non-Medical): No  Physical Activity: Inactive (06/16/2022)   Exercise Vital Sign    Days of Exercise per Week: 0 days    Minutes of Exercise per Session: 0 min  Stress: Stress Concern Present (06/16/2022)   [employer] of Occupational Health - Occupational Stress Questionnaire    Feeling of Stress : Very much  Social Connections: Moderately Isolated (06/16/2022)   Social Connection and Isolation Panel  [NHANES]    Frequency of Communication with Friends and Family: Twice a week    Frequency of Social Gatherings with Friends and Family: Twice a week    Attends Religious Services: More than 4 times per year    Active Member of [employer] or Organizations: No    Attends Banker Meetings: Never    Marital Status: Separated    Allergies: No Known Allergies  Current Medications: Current Outpatient Medications  Medication Sig Dispense Refill   albuterol (VENTOLIN HFA) 108 (90 Base) MCG/ACT inhaler Inhale 2 puffs into the lungs every 4 (four) hours as needed for wheezing or shortness of breath.     aspirin 81  MG chewable tablet Chew 81 mg by mouth daily.     cyclobenzaprine (FLEXERIL) 10 MG tablet Take 10 mg by mouth daily as needed for muscle spasms.     DULoxetine (CYMBALTA) 30 MG capsule Take 1 capsule (30 mg total) by mouth daily. Take with 60 mg capsule for a total of 90 mg of duloxetine 60 capsule 1   DULoxetine (CYMBALTA) 60 MG capsule Take 1 capsule (60 mg total) by mouth daily. 60 capsule 1   furosemide (LASIX) 20 MG tablet Take 20 mg by mouth daily.     gabapentin (NEURONTIN) 100 MG capsule Take 100 mg by mouth 2 (two) times daily.     insulin lispro (HUMALOG) 100 UNIT/ML injection Inject 2-12 Units into the skin 3 (three) times daily before meals. 0-200 Zero units 201-250 Two units 251-300 Four units 301-350 Six units 351-400 Eight units 401-450 Ten units 451-500 Twelve units 501+ call MD (Patient not taking: Reported on 07/03/2022)     insulin NPH Human (HUMULIN N,NOVOLIN N) 100 UNIT/ML injection Inject 27 Units into the skin 2 (two) times daily before a meal.  (Patient not taking: Reported on 07/03/2022)     Melatonin 10 MG TABS Take 1 tablet by mouth at bedtime. (Patient not taking: Reported on 07/03/2022)     Menthol, Topical Analgesic, (BIOFREEZE) 4 % GEL Apply 1 application topically 2 (two) times daily as needed (pain in lower legs/hands). 5% (Patient not taking: Reported  on 07/03/2022)     metoprolol tartrate (LOPRESSOR) 25 MG tablet Take 12.5 mg by mouth daily.     No current facility-administered medications for this visit.     Objective:  Psychiatric Specialty Exam: There were no vitals taken for this visit.There is no height or weight on file to calculate BMI.  General Appearance: Well Groomed  Eye Contact:  Good  Speech:  Clear and Coherent and Normal Rate  Volume:  Normal  Mood:  Euthymic  Affect:  Appropriate and Congruent  Thought Process:  Coherent, Goal Directed, and Linear  Orientation:  Full (Time, Place, and Person)  Thought Content: Logical   Suicidal Thoughts:  No  Homicidal Thoughts:  No  Memory:  Remote;   Good  Judgment:  Good  Insight:  Good  Psychomotor Activity:  Normal  Concentration:  Concentration: Good and Attention Span: Good              Assets:  Communication Skills Desire for Improvement Financial Resources/Insurance Housing Intimacy Leisure Time Physical Health Resilience Social Support Talents/Skills Transportation Vocational/Educational  ADL's:  Intact  Cognition: WNL  Sleep:  Good   PE: General: well-appearing; no acute distress  Pulm: no increased work of breathing on room air  Strength & Muscle Tone: within normal limits Neuro: no focal neurological deficits observed  Gait & Station: normal  Metabolic Disorder Labs: Lab Results  Component Value Date   HGBA1C 10.3 (H) 11/15/2016   MPG 249 11/15/2016   MPG 243 11/14/2016   No results found for: "PROLACTIN" Lab Results  Component Value Date   CHOL  08/31/2009    189        ATP III CLASSIFICATION:  <200     mg/dL   Desirable  161-096  mg/dL   Borderline High  >=045    mg/dL   High          TRIG 59 08/31/2009   HDL 59 08/31/2009   CHOLHDL 3.2 08/31/2009   VLDL 12 08/31/2009   LDLCALC (H) 08/31/2009  118        Total Cholesterol/HDL:CHD Risk Coronary Heart Disease Risk Table                     Men   Women  1/2 Average Risk    3.4   3.3  Average Risk       5.0   4.4  2 X Average Risk   9.6   7.1  3 X Average Risk  23.4   11.0        Use the calculated Patient Ratio above and the CHD Risk Table to determine the patient's CHD Risk.        ATP III CLASSIFICATION (LDL):  <100     mg/dL   Optimal  528-413  mg/dL   Near or Above                    Optimal  130-159  mg/dL   Borderline  244-010  mg/dL   High  >272     mg/dL   Very High   LDLCALC 536 (H) 06/26/2009     Therapeutic Level Labs: No results found for: "LITHIUM" No results found for: "VALPROATE" No results found for: "CBMZ"  Screenings: GAD-7    Flowsheet Row Counselor from 02/19/2023 in [facility] Counselor from 06/16/2022 in [facility]  Total GAD-7 Score 13 20      PHQ2-9    Flowsheet Row Counselor from 07/02/2023 in [facility] Counselor from 02/19/2023 in [facility] Counselor from 06/16/2022 in [facility]  PHQ-2 Total Score 6 3 6 4   PHQ-9 Total Score 24 14 16 15       Flowsheet Row Counselor from 06/16/2022 in [facility]  C-SSRS RISK CATEGORY No Risk       Collaboration of Care: Collaboration of Care:   Patient/Guardian was advised Release of Information must be obtained prior to any record release in order to collaborate their care with an outside provider. Patient/Guardian was advised if they have not already done so to contact the registration department to sign all necessary forms in order for [place] to release information regarding their care.   Consent: Patient/Guardian gives verbal consent for treatment and assignment of benefits for services provided during this visit. Patient/Guardian expressed understanding and agreed to proceed.   A total of 35 minutes was spent involved in face to face  clinical care, chart review, and documentation.   Park Pope, MD 07/17/2023, 12:53 PM

## 2023-07-16 ENCOUNTER — Ambulatory Visit (INDEPENDENT_AMBULATORY_CARE_PROVIDER_SITE_OTHER): Payer: MEDICAID | Admitting: Student

## 2023-07-16 DIAGNOSIS — F331 Major depressive disorder, recurrent, moderate: Secondary | ICD-10-CM

## 2023-07-16 MED ORDER — DULOXETINE HCL 30 MG PO CPEP: 30.0000 mg | ORAL_CAPSULE | Freq: Every day | ORAL | 1 refills | Status: DC

## 2023-07-16 MED ORDER — DULOXETINE HCL 60 MG PO CPEP: 60.0000 mg | ORAL_CAPSULE | Freq: Every day | ORAL | 1 refills | Status: DC

## 2023-07-23 ENCOUNTER — Encounter: Payer: Self-pay | Admitting: Obstetrics and Gynecology

## 2023-07-23 ENCOUNTER — Other Ambulatory Visit: Payer: Self-pay

## 2023-07-23 ENCOUNTER — Ambulatory Visit (INDEPENDENT_AMBULATORY_CARE_PROVIDER_SITE_OTHER): Payer: MEDICAID | Admitting: Obstetrics and Gynecology

## 2023-07-23 VITALS — BP 134/78 | HR 84 | Ht 65.0 in | Wt 217.1 lb

## 2023-07-23 DIAGNOSIS — R8290 Unspecified abnormal findings in urine: Secondary | ICD-10-CM

## 2023-07-23 DIAGNOSIS — Z01419 Encounter for gynecological examination (general) (routine) without abnormal findings: Secondary | ICD-10-CM | POA: Diagnosis not present

## 2023-07-23 NOTE — Progress Notes (Signed)
ANNUAL EXAM Patient name: Veronica [name] MRN [id number]  Date of birth: 06-25-1961 Chief Complaint:   Establish Care  History of Present Illness:   Veronica [name] is a 62 y.o. No obstetric history on file. being seen today for a routine annual exam.  Current complaints: annual, establish care  Menstrual concerns? No  postmenopausal since about 2006 or 2009, no PMB  Breast or nipple changes? No  Contraception use? Yes postmenopause Sexually active? No no desires   Urinary hesitancy today ; last time she experienced this was when she was sexually active Other night fell on cemet floor en route to the bathroom an d  No LMP recorded. Patient is postmenopausal.   The pregnancy intention screening data noted above was reviewed. Potential methods of contraception were discussed. The patient elected to proceed with No data recorded.   Last pap 11/22/2021 NILM, HPV negative  H/O abnormal pap: yes slightly abnormal several years Last mammogram: 02/2023 [facility] 1 Last colonoscopy: had a prior colectomy, has a small amount of colon without colostomy - follows with GI     07/02/2023    3:22 PM 02/19/2023    2:22 PM 06/16/2022    1:32 PM 08/15/2016   10:45 AM  Depression screen PHQ 2/9  Decreased Interest    2  Down, Depressed, Hopeless    2  PHQ - 2 Score    4  Altered sleeping    3  Tired, decreased energy    3  Change in appetite    2  Feeling bad or failure about yourself     1  Trouble concentrating    1  Moving slowly or fidgety/restless    1  Suicidal thoughts    0  PHQ-9 Score    15  Difficult doing work/chores    Very difficult     Information is confidential and restricted. Go to Review Flowsheets to unlock data.        07/23/2023    3:56 PM 02/19/2023    2:13 PM 06/16/2022    1:39 PM  GAD 7 : Generalized Anxiety Score  Nervous, Anxious, on Edge 0    Control/stop worrying 1    Worry too much - different things 2    Trouble relaxing 1    Restless 0    Easily annoyed  or irritable 1    Afraid - awful might happen 1    Total GAD 7 Score 6    Anxiety Difficulty        Information is confidential and restricted. Go to Review Flowsheets to unlock data.     Review of Systems:   Pertinent items are noted in HPI Denies any headaches, blurred vision, fatigue, shortness of breath, chest pain, abdominal pain, abnormal vaginal discharge/itching/odor/irritation, problems with periods, bowel movements, urination, or intercourse unless otherwise stated above. Pertinent History Reviewed:  Reviewed past medical,surgical, social and family history.  Reviewed problem list, medications and allergies. Physical Assessment:   Vitals:   07/23/23 1549  BP: 134/78  Pulse: 84  Weight: 217 lb 1.6 oz (98.5 kg)  Height: 5\' 5"  (1.651 m)  There is no height or weight on file to calculate BMI.        Physical Examination:   General appearance - well appearing, and in no distress  Mental status - alert, oriented to person, place, and time  Psych:  She has a normal mood and affect  Skin - warm and dry, normal color, no suspicious  lesions noted  Chest - effort normal, all lung fields clear to auscultation bilaterally  Heart - normal rate and regular rhythm  Pelvic - deferred  Extremities:  No swelling or varicosities noted  Chaperone present for exam  No results found for this or any previous visit (from the past 24 hour(s)).    Assessment & Plan:   1. Well woman exam with routine gynecological exam - Cervical cancer screening: Discussed guidelines. Pap with HPV up to date - Breast Health: Encouraged self breast awareness/SBE. Discussed limits of clinical breast exam for detecting breast cancer. Discussed importance of annual MXR. MXR is up to date: 2024 - Climacteric/Sexual health: Reviewed typical and atypical symptoms of menopause/peri-menopause. Discussed PMB and to call if any amount of spotting.  - Colonoscopy: Per PCP - F/U 12 months and prn  2. Abnormal  urinalysis New onset urinary hesitancy, UA/Ucx to rule out UTI.  - Urine Culture  Orders Placed This Encounter  Procedures   Urine Culture   HM PAP SMEAR    Meds: No orders of the defined types were placed in this encounter.   Follow-up: Return if symptoms worsen or fail to improve, for Annual GYN.  Lorriane Shire, MD 07/23/2023 3:28 PM

## 2023-07-24 LAB — POCT URINALYSIS DIP (DEVICE)
Glucose, UA: NEGATIVE mg/dL
Hgb urine dipstick: NEGATIVE
Nitrite: NEGATIVE
Protein, ur: 30 mg/dL — AB
Specific Gravity, Urine: >=1.03 (ref 1.005–1.030)
Urobilinogen, UA: 0.2 mg/dL (ref 0.0–1.0)
pH: 5.5 (ref 5.0–8.0)

## 2023-07-28 ENCOUNTER — Other Ambulatory Visit: Payer: Self-pay | Admitting: Obstetrics and Gynecology

## 2023-07-28 DIAGNOSIS — N3000 Acute cystitis without hematuria: Secondary | ICD-10-CM

## 2023-07-28 LAB — URINE CULTURE

## 2023-07-28 MED ORDER — SULFAMETHOXAZOLE-TRIMETHOPRIM 800-160 MG PO TABS
1.0000 | ORAL_TABLET | Freq: Two times a day (BID) | ORAL | 0 refills | Status: AC
Start: 2023-07-28 — End: 2023-08-02

## 2023-07-30 ENCOUNTER — Telehealth: Payer: Self-pay

## 2023-07-30 NOTE — Telephone Encounter (Signed)
Call placed to patient. Pt did not answer. Left VM with results. Pt advised to return call to office with any questions or concerns. Judeth Cornfield, RNC

## 2023-07-30 NOTE — Telephone Encounter (Signed)
-----   Message from Lorriane Shire sent at 07/28/2023  4:16 PM EDT ----- Urine culture shows UTI - antibiotic sent in to pharmacy
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
## 2023-08-03 ENCOUNTER — Ambulatory Visit (INDEPENDENT_AMBULATORY_CARE_PROVIDER_SITE_OTHER): Payer: MEDICAID | Admitting: Allergy

## 2023-08-03 ENCOUNTER — Other Ambulatory Visit: Payer: Self-pay

## 2023-08-03 ENCOUNTER — Encounter: Payer: Self-pay | Admitting: Allergy

## 2023-08-03 VITALS — BP 124/70 | HR 93 | Temp 98.2°F | Resp 16 | Ht 64.37 in | Wt 217.0 lb

## 2023-08-03 DIAGNOSIS — L819 Disorder of pigmentation, unspecified: Secondary | ICD-10-CM

## 2023-08-03 DIAGNOSIS — J3089 Other allergic rhinitis: Secondary | ICD-10-CM

## 2023-08-03 DIAGNOSIS — Z91018 Allergy to other foods: Secondary | ICD-10-CM | POA: Diagnosis not present

## 2023-08-03 DIAGNOSIS — Z91038 Other insect allergy status: Secondary | ICD-10-CM | POA: Diagnosis not present

## 2023-08-03 DIAGNOSIS — J4520 Mild intermittent asthma, uncomplicated: Secondary | ICD-10-CM

## 2023-08-03 DIAGNOSIS — Z77120 Contact with and (suspected) exposure to mold (toxic): Secondary | ICD-10-CM

## 2023-08-03 NOTE — Patient Instructions (Addendum)
Mold exposure There is no test to see if there's mold in your body. Start environmental control measures as below. Use over the counter antihistamines such as Zyrtec (cetirizine), Claritin (loratadine), Allegra (fexofenadine), or Xyzal (levocetirizine) daily as needed. May switch antihistamines every few months. Stop montelukast.  Use azelastine nasal spray 1-2 sprays per nostril twice a day as needed for runny nose/drainage. Nasal saline spray (i.e., Simply Saline) or nasal saline lavage (i.e., NeilMed) is recommended as needed and prior to medicated nasal sprays.  Skin Stop using topical steroid and antifungal creams as ineffective. Recommend dermatology evaluation next.   See below for proper skin care. Use fragrance free and dye free products. No dryer sheets or fabric softener.    Breathing Normal breathing test.  May use albuterol rescue inhaler 2 puffs every 4 to 6 hours as needed for shortness of breath, chest tightness, coughing, and wheezing.  Monitor frequency of use - if you need to use it more than twice per week on a consistent basis let us know.   History of food allergy Get bloodwork for coconut.  Bee stings Continue to avoid. Get bloodwork  Follow up if needed depending on bloodwork results.    Mold Control Mold and fungi can grow on a variety of surfaces provided certain temperature and moisture conditions exist.  Outdoor molds grow on plants, decaying vegetation and soil. The major outdoor mold, Alternaria and Cladosporium, are found in very high numbers during hot and dry conditions. Generally, a late summer - fall peak is seen for common outdoor fungal spores. Rain will temporarily lower outdoor mold spore count, but counts rise rapidly when the rainy period ends. The most important indoor molds are Aspergillus and Penicillium. Dark, humid and poorly ventilated basements are ideal sites for mold growth. The next most common sites of mold growth are the bathroom  and the kitchen. Outdoor (Seasonal) Mold Control Use air conditioning and keep windows closed. Avoid exposure to decaying vegetation. Avoid leaf raking. Avoid grain handling. Consider wearing a face mask if working in moldy areas.  Indoor (Perennial) Mold Control  Maintain humidity below 50%. Get rid of mold growth on hard surfaces with water, detergent and, if necessary, 5% bleach (do not mix with other cleaners). Then dry the area completely. If mold covers an area more than 10 square feet, consider hiring an indoor environmental professional. For clothing, washing with soap and water is best. If moldy items cannot be cleaned and dried, throw them away. Remove sources e.g. contaminated carpets. Repair and seal leaking roofs or pipes. Using dehumidifiers in damp basements may be helpful, but empty the water and clean units regularly to prevent mildew from forming. All rooms, especially basements, bathrooms and kitchens, require ventilation and cleaning to deter mold and mildew growth. Avoid carpeting on concrete or damp floors, and storing items in damp areas.   Skin care recommendations  Bath time: Always use lukewarm water. AVOID very hot or cold water. Keep bathing time to 5-10 minutes. Do NOT use bubble bath. Use a mild soap and use just enough to wash the dirty areas. Do NOT scrub skin vigorously.  After bathing, pat dry your skin with a towel. Do NOT rub or scrub the skin.  Moisturizers and prescriptions:  ALWAYS apply moisturizers immediately after bathing (within 3 minutes). This helps to lock-in moisture. Use the moisturizer several times a day over the whole body. Good summer moisturizers include: Aveeno, CeraVe, Cetaphil. Good winter moisturizers include: Aquaphor, Vaseline, Cerave, Cetaphil, Eucerin, Vanicream.  When using moisturizers along with medications, the moisturizer should be applied about one hour after applying the medication to prevent diluting effect of the  medication or moisturize around where you applied the medications. When not using medications, the moisturizer can be continued twice daily as maintenance.  Laundry and clothing: Avoid laundry products with added color or perfumes. Use unscented hypo-allergenic laundry products such as Tide free, Cheer free & gentle, and All free and clear.  If the skin still seems dry or sensitive, you can try double-rinsing the clothes. Avoid tight or scratchy clothing such as wool. Do not use fabric softeners or dyer sheets.

## 2023-08-07 LAB — ALLERGENS W/TOTAL IGE AREA 2

## 2023-08-07 LAB — HYMENOPTERA VENOM ALLERGY II
Bumblebee: <0.1 kU/L
Hornet, White Face, IgE: <0.1 kU/L
Hornet, Yellow, IgE: <0.1 kU/L
I001-IgE Honeybee: <0.1 kU/L
I003-IgE Yellow Jacket: <0.1 kU/L
I004-IgE Paper Wasp: <0.1 kU/L
I208-IgE Api m 1: <0.1 kU/L
I209-IgE Ves v 5: <0.1 kU/L
I210-IgE Pol d 5: <0.1 kU/L
I211-IgE Ves v 1: <0.1 kU/L
I214-IgE Api m 2: <0.1 kU/L
I215-IgE Api m 3: <0.1 kU/L
I216-IgE Api m 5: <0.1 kU/L
I217-IgE Api m 10: <0.1 kU/L
Tryptase: 3.8 ug/L (ref 2.2–13.2)

## 2023-08-07 LAB — ALLERGEN PROFILE, MOLD
Aureobasidi Pullulans IgE: <0.1 kU/L
Candida Albicans IgE: <0.1 kU/L
M009-IgE Fusarium proliferatum: <0.1 kU/L
M014-IgE Epicoccum purpur: <0.1 kU/L
Mucor Racemosus IgE: <0.1 kU/L
Phoma Betae IgE: <0.1 kU/L
Setomelanomma Rostrat: <0.1 kU/L
Stemphylium Herbarum IgE: <0.1 kU/L

## 2023-08-07 LAB — ALLERGEN COMPONENT COMMENTS

## 2023-08-07 LAB — ALLERGEN COCONUT IGE: Allergen Coconut IgE: <0.1 kU/L

## 2023-08-20 ENCOUNTER — Ambulatory Visit (INDEPENDENT_AMBULATORY_CARE_PROVIDER_SITE_OTHER): Payer: MEDICAID | Admitting: Mental Health

## 2023-08-20 ENCOUNTER — Encounter (HOSPITAL_COMMUNITY): Payer: Self-pay

## 2023-08-20 DIAGNOSIS — F322 Major depressive disorder, single episode, severe without psychotic features: Secondary | ICD-10-CM

## 2023-08-20 DIAGNOSIS — F4310 Post-traumatic stress disorder, unspecified: Secondary | ICD-10-CM

## 2023-08-20 DIAGNOSIS — F411 Generalized anxiety disorder: Secondary | ICD-10-CM

## 2023-08-20 NOTE — Progress Notes (Unsigned)
   THERAPIST PROGRESS NOTE  Session Time: 3:11 pm ( 50 minutes)  Participation Level: Active  Behavioral Response: NeatAlertEuthymic  Type of Therapy: Individual Therapy  Treatment Goals addressed:  STG: Veronica [name] will decrease sxs of depression AEB development of x 5 effective coping skills with ability to reframe maladaptive thinking patterns 7/7 days within the next 6 months   ProgressTowards Goals: Progressing  Interventions: Supportive  Summary: Veronica [name] is a 62 y.o. female who presents with hx of dx of depression and generalized anxiety. [name] presents alert and oriented; mood and affect stable. Speech clear and coherent at normal rate and tone. Engaged and receptive to interventions. Shares with therapist updates in looking for additional housing with housing coordinator and ability to move with husband. Shares thoughts on moving being delayed however is hopeful she will be able to move in new\ place with husband. Shares for apartment complex to still not taking accountability for mold in place. Shares for spirits to have been increased and reports decreased feelings of depression. Shares has been able to tap back into her faith. 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 Shares for husband to be a good support and working to engage in balanced thoughts. Ongoing process with goals. Denies SI/HI  Suicidal/Homicidal: Nowithout intent/plan  Therapist Response: Therapist engaged [place] in therapy session. Assessed for current level of functioning, sxs management and level of stressors. Provided support and encouragement validated feelings and provided supportive feedback. Supported in processing desire to move and moving in with husband officially. Explored ability to cope with feelings of depression and engagement in coping skills and ability to work to reframe thoughts. Reviewed session and provided follow up.   Plan: Return again in x 6  weeks.  Diagnosis: Severe major depression without psychotic features (HCC)  Post traumatic stress disorder  Generalized anxiety disorder  GAD (generalized anxiety disorder)  Collaboration of Care: Other None  Patient/Guardian was advised Release of Information must be obtained prior to any record release in order to collaborate their care with an outside provider. Patient/Guardian was advised if they have not already done so to contact the registration department to sign all necessary forms in order for [place] to release information regarding their care.   Consent: Patient/Guardian gives verbal consent for treatment and assignment of benefits for services provided during this visit. Patient/Guardian expressed understanding and agreed to proceed.   Stephan Minister [place], [facility] 08/21/2023

## 2023-09-14 NOTE — Progress Notes (Deleted)
BH MD Outpatient Progress Note  09/14/2023 5:12 PM Veronica [name]  MRN:  [id number]  Assessment:  Veronica [name] presents for follow-up evaluation in-person. Today, 09/14/23, patient reports feeling better in terms of mood and anxiety although has noted mild exacerbation in setting of multiple psychosocial stressors. Given situational worsening, patient would like to continue current duloxetine dose to ensure her depressive symptoms are well-controlled. Patient to follow up in ~2 months.   Identifying Information: Veronica [name] is a 62 y.o. y.o. female with a history of MDD, GAD, PTSD, fibromyalgia who presents in person to [facility] for medication management.    Plan: # Major Depressive Disorder Past medication trials: Prozac (memory difficulties, ineffective), mirtazapine (weight gain) Status of problem: improving Interventions: -- Continue duloxetine 90 mg daily for depression and anxiety             -CMP reviewed from 04/24/22, Na 140, AST/ALT wnl, eGFR>90 -- Continue therapy with Stephan Minister (next appointment 11/14/21)   # Generalized Anxiety Disorder Past medication trials:  Status of problem: stable Interventions: -- Duloxetine as above   # PTSD Past medication trials:  Status of problem: stable Interventions: -- Duloxetine as above  Patient was given contact information for behavioral health clinic and was instructed to call 911 for emergencies.   Subjective:  Chief Complaint:  No chief complaint on file.   Interval History:  Patient reports continuing to struggle with some psychosocial stressors.  She had initially done well for a few months after I saw her in March 2024.  She reports that her symptoms had significantly worsened following multiple stressors including finding significant mold all over her apartment.  She reports she has to vacate her apartment soon given the biohazard that her apartment has become.  She denies SI/HI/AVH  and continues to have religion very strong protective factor for her.  She reports that her husband has been very supportive of her which has been very beneficial.  Her appetite and sleep have varied depending on how stress she feels.  She does feel that once she has moved apartments she will feel relieved and her mood will improve.  She also reports that another factor related to moldy apartment is that many of her memorabilia have also grown mold which has led to her needing to give those up.  Visit Diagnosis:  No diagnosis found.     Past Medical History:  Past Medical History:  Diagnosis Date   Anxiety    Arthritis    Asthma    Coronary artery disease    nonobstructive by 2010 cath   Cough    Depression    Diabetes mellitus    GERD (gastroesophageal reflux disease)    years ago   H/O diverticulitis of colon    Heart attack (HCC) 08/2009   Neuropathy    OSA (obstructive sleep apnea)    pt denies    Pneumonia    Sciatica    Urticaria     Past Surgical History:  Procedure Laterality Date   COLONOSCOPY     NASAL RECONSTRUCTION     ORIF PATELLA Left 10/09/2017   Procedure: PATELLA TENDON REPAIR;  Surgeon: Yolonda Kida, MD;  Location: [facility] OR;  Service: Orthopedics;  Laterality: Left;  120 MINS   PARTIAL COLECTOMY     PATELLAR TENDON REPAIR Left 10/09/2017   TONSILLECTOMY AND ADENOIDECTOMY     UVULOPALATOPHARYNGOPLASTY       Family History:  Family History  Problem  Relation Age of Onset   Allergic rhinitis Mother    Diabetes Mother        type 2   Diabetes Brother    Congestive Heart Failure Brother    Emphysema Paternal Grandfather    Tuberculosis Paternal Grandfather    Diabetes Other        cousin type 1    Social History:  Social History   Socioeconomic History   Marital status: Legally Separated    Spouse name: Not on file   Number of children: Not on file   Years of education: Not on file   Highest education level: Not on file  Occupational  History   Not on file  Tobacco Use   Smoking status: Former    Current packs/day: 0.00    Types: Cigarettes    Start date: 10/28/1983    Quit date: 10/28/1987    Years since quitting: 35.9    Passive exposure: Past   Smokeless tobacco: Never   Tobacco comments:    1 pack per 2 months  Vaping Use   Vaping status: Never Used  Substance and Sexual Activity   Alcohol use: No    Alcohol/week: 0.0 standard drinks of alcohol   Drug use: No   Sexual activity: Not Currently    Birth control/protection: None  Other Topics Concern   Not on file  Social History Narrative   Originally from [place]. Always lived in [place]. [place]. 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 She has no pets currently. No bird or hot tub exposure. She reports that there has been mold in her basement before that was treated twice. She also has mold in her bathroom & bedroom.    Social Determinants of Health   Financial Resource Strain: Medium Risk (06/16/2022)   Overall Financial Resource Strain (CARDIA)    Difficulty of Paying Living Expenses: Somewhat hard  Food Insecurity: Food Insecurity Present (07/23/2023)   Hunger Vital Sign    Worried About Running Out of Food in the Last Year: Sometimes true    Ran Out of Food in the Last Year: Often true  Transportation Needs: No Transportation Needs (07/23/2023)   PRAPARE - Administrator, Civil Service (Medical): No    Lack of Transportation (Non-Medical): No  Physical Activity: Inactive (06/16/2022)   Exercise Vital Sign    Days of Exercise per Week: 0 days    Minutes of Exercise per Session: 0 min  Stress: Stress Concern Present (06/16/2022)   [employer] of Occupational Health - Occupational Stress Questionnaire    Feeling of Stress : Very much  Social Connections: Moderately Isolated (06/16/2022)   Social Connection and Isolation Panel [NHANES]    Frequency of  Communication with Friends and Family: Twice a week    Frequency of Social Gatherings with Friends and Family: Twice a week    Attends Religious Services: More than 4 times per year    Active Member of [employer] or Organizations: No    Attends Banker Meetings: Never    Marital Status: Separated    Allergies: No Known Allergies  Current Medications: Current Outpatient Medications  Medication Sig Dispense Refill   albuterol (VENTOLIN HFA) 108 (90 Base) MCG/ACT inhaler Inhale 2 puffs into the lungs every 4 (four) hours as needed for wheezing or shortness of breath.     aspirin 81 MG chewable tablet Chew  81 mg by mouth daily.     Azelastine HCl 137 MCG/SPRAY SOLN Place into the nose.     cyclobenzaprine (FLEXERIL) 10 MG tablet Take 10 mg by mouth daily as needed for muscle spasms.     DULoxetine (CYMBALTA) 30 MG capsule Take 1 capsule (30 mg total) by mouth daily. Take with 60 mg capsule for a total of 90 mg of duloxetine 60 capsule 1   DULoxetine (CYMBALTA) 60 MG capsule Take 1 capsule (60 mg total) by mouth daily. 60 capsule 1   fexofenadine (ALLEGRA) 180 MG tablet Take by mouth.     furosemide (LASIX) 20 MG tablet Take 20 mg by mouth daily.     gabapentin (NEURONTIN) 100 MG capsule Take 100 mg by mouth 2 (two) times daily.     LANTUS 100 UNIT/ML injection 27 Units 2 (two) times daily.     Menthol, Topical Analgesic, (BIOFREEZE) 4 % GEL Apply 1 application  topically 2 (two) times daily as needed (pain in lower legs/hands). 5%     metFORMIN (GLUCOPHAGE) 500 MG tablet Take 500 mg by mouth 2 (two) times daily.     MOUNJARO 12.5 MG/0.5ML Pen Inject 12.5 mg into the skin once a week.     No current facility-administered medications for this visit.     Objective:  Psychiatric Specialty Exam: There were no vitals taken for this visit.There is no height or weight on file to calculate BMI.  General Appearance: Well Groomed  Eye Contact:  Good  Speech:  Clear and Coherent and  Normal Rate  Volume:  Normal  Mood:  Euthymic  Affect:  Appropriate and Congruent  Thought Process:  Coherent, Goal Directed, and Linear  Orientation:  Full (Time, Place, and Person)  Thought Content: Logical   Suicidal Thoughts:  No  Homicidal Thoughts:  No  Memory:  Remote;   Good  Judgment:  Good  Insight:  Good  Psychomotor Activity:  Normal  Concentration:  Concentration: Good and Attention Span: Good              Assets:  Communication Skills Desire for Improvement Financial Resources/Insurance Housing Intimacy Leisure Time Physical Health Resilience Social Support Talents/Skills Transportation Vocational/Educational  ADL's:  Intact  Cognition: WNL  Sleep:  Good   PE: General: well-appearing; no acute distress  Pulm: no increased work of breathing on room air  Strength & Muscle Tone: within normal limits Neuro: no focal neurological deficits observed  Gait & Station: normal  Metabolic Disorder Labs: Lab Results  Component Value Date   HGBA1C 10.3 (H) 11/15/2016   MPG 249 11/15/2016   MPG 243 11/14/2016   No results found for: "PROLACTIN" Lab Results  Component Value Date   CHOL  08/31/2009    189        ATP III CLASSIFICATION:  <200     mg/dL   Desirable  604-540  mg/dL   Borderline High  >=981    mg/dL   High          TRIG 59 08/31/2009   HDL 59 08/31/2009   CHOLHDL 3.2 08/31/2009   VLDL 12 08/31/2009   LDLCALC (H) 08/31/2009    118        Total Cholesterol/HDL:CHD Risk Coronary Heart Disease Risk Table                     Men   Women  1/2 Average Risk   3.4   3.3  Average Risk  5.0   4.4  2 X Average Risk   9.6   7.1  3 X Average Risk  23.4   11.0        Use the calculated Patient Ratio above and the CHD Risk Table to determine the patient's CHD Risk.        ATP III CLASSIFICATION (LDL):  <100     mg/dL   Optimal  782-956  mg/dL   Near or Above                    Optimal  130-159  mg/dL   Borderline  213-086  mg/dL    High  >578     mg/dL   Very High   LDLCALC 469 (H) 06/26/2009     Therapeutic Level Labs: No results found for: "LITHIUM" No results found for: "VALPROATE" No results found for: "CBMZ"  Screenings: GAD-7    Flowsheet Row Office Visit from 07/23/2023 in [facility] [employer] [facility] Counselor from 02/19/2023 in [facility] Counselor from 06/16/2022 in [facility]  Total GAD-7 Score 6 13 20       PHQ2-9    Flowsheet Row Office Visit from 07/23/2023 in [facility] for [facility] Counselor from 07/02/2023 in [facility] Counselor from 02/19/2023 in [facility] Counselor from 06/16/2022 in [facility] Office Visit from 08/15/2016 in [facility] Claud[facility]  PHQ-2 Total Score 5 6 3 6 4   PHQ-9 Total Score 14 24 14 16 15       Flowsheet Row Counselor from 06/16/2022 in [facility]  C-SSRS RISK CATEGORY No Risk       Collaboration of Care: Collaboration of Care:   Patient/Guardian was advised Release of Information must be obtained prior to any record release in order to collaborate their care with an outside provider. Patient/Guardian was advised if they have not already done so to contact the registration department to sign all necessary forms in order for [place] to release information regarding their care.   Consent: Patient/Guardian gives verbal consent for treatment and assignment of benefits for services provided during this visit. Patient/Guardian expressed understanding and agreed to proceed.   A total of 35 minutes was spent involved in face to face clinical care, chart review, and documentation.   Park Pope, MD 09/14/2023, 5:12 PM

## 2023-09-15 ENCOUNTER — Encounter (HOSPITAL_COMMUNITY): Payer: Self-pay

## 2023-09-15 ENCOUNTER — Encounter (HOSPITAL_COMMUNITY): Payer: MEDICAID | Admitting: Student

## 2023-09-28 ENCOUNTER — Telehealth (HOSPITAL_COMMUNITY): Payer: Self-pay | Admitting: Student

## 2023-09-28 DIAGNOSIS — F331 Major depressive disorder, recurrent, moderate: Secondary | ICD-10-CM

## 2023-09-28 MED ORDER — DULOXETINE HCL 30 MG PO CPEP
30.0000 mg | ORAL_CAPSULE | Freq: Every day | ORAL | 1 refills | Status: DC
Start: 2023-09-28 — End: 2023-11-03

## 2023-09-28 MED ORDER — DULOXETINE HCL 60 MG PO CPEP: 60.0000 mg | ORAL_CAPSULE | Freq: Every day | ORAL | 1 refills | Status: DC

## 2023-09-28 NOTE — Telephone Encounter (Signed)
Sent refill of duloxetine to preferred pharmacy.

## 2023-10-04 ENCOUNTER — Emergency Department (HOSPITAL_COMMUNITY): Payer: MEDICAID

## 2023-10-04 ENCOUNTER — Emergency Department (HOSPITAL_COMMUNITY)
Admission: EM | Admit: 2023-10-04 | Discharge: 2023-10-04 | Disposition: A | Discharge status: Home / Self Care | Payer: MEDICAID | Attending: Emergency Medicine | Admitting: Emergency Medicine

## 2023-10-04 ENCOUNTER — Other Ambulatory Visit: Payer: Self-pay

## 2023-10-04 ENCOUNTER — Encounter (HOSPITAL_COMMUNITY): Payer: Self-pay

## 2023-10-04 DIAGNOSIS — R519 Headache, unspecified: Secondary | ICD-10-CM | POA: Diagnosis not present

## 2023-10-04 DIAGNOSIS — S80219A Abrasion, unspecified knee, initial encounter: Secondary | ICD-10-CM | POA: Insufficient documentation

## 2023-10-04 DIAGNOSIS — E119 Type 2 diabetes mellitus without complications: Secondary | ICD-10-CM | POA: Diagnosis not present

## 2023-10-04 DIAGNOSIS — W010XXA Fall on same level from slipping, tripping and stumbling without subsequent striking against object, initial encounter: Secondary | ICD-10-CM | POA: Insufficient documentation

## 2023-10-04 DIAGNOSIS — Z7982 Long term (current) use of aspirin: Secondary | ICD-10-CM | POA: Insufficient documentation

## 2023-10-04 DIAGNOSIS — M546 Pain in thoracic spine: Secondary | ICD-10-CM | POA: Insufficient documentation

## 2023-10-04 DIAGNOSIS — M542 Cervicalgia: Secondary | ICD-10-CM | POA: Insufficient documentation

## 2023-10-04 DIAGNOSIS — Z7984 Long term (current) use of oral hypoglycemic drugs: Secondary | ICD-10-CM | POA: Diagnosis not present

## 2023-10-04 DIAGNOSIS — M5440 Lumbago with sciatica, unspecified side: Secondary | ICD-10-CM | POA: Insufficient documentation

## 2023-10-04 DIAGNOSIS — S60519A Abrasion of unspecified hand, initial encounter: Secondary | ICD-10-CM | POA: Diagnosis present

## 2023-10-04 DIAGNOSIS — Z794 Long term (current) use of insulin: Secondary | ICD-10-CM | POA: Insufficient documentation

## 2023-10-04 DIAGNOSIS — W19XXXA Unspecified fall, initial encounter: Secondary | ICD-10-CM

## 2023-10-04 DIAGNOSIS — Z23 Encounter for immunization: Secondary | ICD-10-CM | POA: Insufficient documentation

## 2023-10-04 DIAGNOSIS — Y9301 Activity, walking, marching and hiking: Secondary | ICD-10-CM | POA: Insufficient documentation

## 2023-10-04 MED ORDER — TETANUS-DIPHTH-ACELL PERTUSSIS 5-2.5-18.5 LF-MCG/0.5 IM SUSY
0.5000 mL | PREFILLED_SYRINGE | Freq: Once | INTRAMUSCULAR | Status: AC
  Administered 2023-10-04: 0.5 mL via INTRAMUSCULAR
  Filled 2023-10-04: qty 0.5

## 2023-10-04 MED ORDER — HYDROCODONE-ACETAMINOPHEN 5-325 MG PO TABS
1.0000 | ORAL_TABLET | Freq: Once | ORAL | Status: AC
  Administered 2023-10-04: 1 via ORAL
  Filled 2023-10-04: qty 1

## 2023-10-04 MED ORDER — BACITRACIN ZINC 500 UNIT/GM EX OINT
TOPICAL_OINTMENT | Freq: Once | CUTANEOUS | Status: AC
  Administered 2023-10-04: 1 via TOPICAL
  Filled 2023-10-04: qty 0.9

## 2023-10-04 NOTE — Discharge Instructions (Signed)
X-rays did not show any signs of fracture or serious injury.  Take over-the-counter medications as needed for pain.  Apply ice to help with swelling.  Apply antibiotic ointment to the abrasions on your skin

## 2023-10-04 NOTE — ED Triage Notes (Signed)
[facility] for [facility]. Pt fell forward and hit the right side of her head on the concrete. C/o neck pressure and a headache. Pt has an abrasion on her palms and right knee. Pt did not lose consciousness and is not on any blood thinners.  136/82 94% RA 96 HR  207 cbg

## 2023-10-04 NOTE — ED Provider Notes (Signed)
[place] EMERGENCY DEPARTMENT AT [facility] Provider Note   CSN: [id number] Arrival date & time: 10/04/23  2037     History  Chief Complaint  Patient presents with   Marletta Lor    Veronica [name] is a 62 y.o. female.   Fall   Patient has a history of obstructive sleep apnea diabetes sciatica chronic back pain, fibromyalgia, depression who presents to the ED for evaluation after a fall.  Patient states she was walking and stumbled falling forward.  She struck her face on the concrete.  She does not think she lost consciousness but is having a headache and is also having pain in her neck.  She also has some pain in her back.  She has chronic back pain but does feel like it has been exacerbated from the fall.  She did scrape her knees and hands but denies any significant pain there.    Home Medications Prior to Admission medications   Medication Sig Start Date End Date Taking? Authorizing Provider  albuterol (VENTOLIN HFA) 108 (90 Base) MCG/ACT inhaler Inhale 2 puffs into the lungs every 4 (four) hours as needed for wheezing or shortness of breath.    [provider]  aspirin 81 MG chewable tablet Chew 81 mg by mouth daily.    [provider]  Azelastine HCl 137 MCG/SPRAY SOLN Place into the nose. 07/25/21   [provider]  cyclobenzaprine (FLEXERIL) 10 MG tablet Take 10 mg by mouth daily as needed for muscle spasms.    [provider]  DULoxetine (CYMBALTA) 30 MG capsule Take 1 capsule (30 mg total) by mouth daily. Take with 60 mg capsule for a total of 90 mg of duloxetine 09/28/23 01/26/24  Park Pope, MD  DULoxetine (CYMBALTA) 60 MG capsule Take 1 capsule (60 mg total) by mouth daily. 09/28/23 01/26/24  Park Pope, MD  fexofenadine (ALLEGRA) 180 MG tablet Take by mouth. 11/12/21   [provider]  furosemide (LASIX) 20 MG tablet Take 20 mg by mouth daily.    [provider]  gabapentin (NEURONTIN) 100 MG capsule Take 100 mg by  mouth 2 (two) times daily.    [provider]  LANTUS 100 UNIT/ML injection 27 Units 2 (two) times daily.    [provider]  Menthol, Topical Analgesic, (BIOFREEZE) 4 % GEL Apply 1 application  topically 2 (two) times daily as needed (pain in lower legs/hands). 5%    [provider]  metFORMIN (GLUCOPHAGE) 500 MG tablet Take 500 mg by mouth 2 (two) times daily.    [provider]  MOUNJARO 12.5 MG/0.5ML Pen Inject 12.5 mg into the skin once a week.    [provider]      Allergies    Patient has no known allergies.    Review of Systems   Review of Systems  Physical Exam Updated Vital Signs BP (!) 166/78   Pulse 84   Temp 98.7 F (37.1 C) (Oral)   Resp 18   SpO2 99%  Physical Exam Vitals and nursing note reviewed.  Constitutional:      General: She is not in acute distress.    Appearance: She is well-developed.  HENT:     Head: Normocephalic.     Right Ear: External ear normal.     Left Ear: External ear normal.  Eyes:     General: No scleral icterus.       Right eye: No discharge.  Left eye: No discharge.     Conjunctiva/sclera: Conjunctivae normal.  Neck:     Trachea: No tracheal deviation.  Cardiovascular:     Rate and Rhythm: Normal rate and regular rhythm.  Pulmonary:     Effort: Pulmonary effort is normal. No respiratory distress.     Breath sounds: Normal breath sounds. No stridor. No wheezing or rales.  Abdominal:     General: Bowel sounds are normal. There is no distension.     Palpations: Abdomen is soft.     Tenderness: There is no abdominal tenderness. There is no guarding or rebound.  Musculoskeletal:        General: No deformity.     Cervical back: Neck supple. Tenderness present.     Thoracic back: Tenderness present.     Lumbar back: Tenderness present.     Comments: Abrasions noted on the hands and knees but there is no bony tenderness to palpation, full range of motion  Skin:    General: Skin is  warm and dry.     Findings: No rash.  Neurological:     General: No focal deficit present.     Mental Status: She is alert.     Cranial Nerves: No cranial nerve deficit, dysarthria or facial asymmetry.     Sensory: No sensory deficit.     Motor: No abnormal muscle tone or seizure activity.     Coordination: Coordination normal.  Psychiatric:        Mood and Affect: Mood normal.     ED Results / Procedures / Treatments   Labs (all labs ordered are listed, but only abnormal results are displayed) Labs Reviewed - No data to display  EKG None  Radiology DG Lumbar Spine Complete  Result Date: 10/04/2023 CLINICAL DATA:  Recent fall with low back pain, initial encounter EXAM: LUMBAR SPINE - COMPLETE 4+ VIEW COMPARISON:  07/18/2015 FINDINGS: Five lumbar type vertebral bodies are well visualized. Vertebral body height is well maintained. Very mild facet hypertrophic changes are noted. No pars defects are seen. Degenerative anterolisthesis of L4 on L5 is noted stable in appearance from the prior exam. No soft tissue abnormality is seen. IMPRESSION: Mild degenerative change with grade 1 anterolisthesis of L4 on L5 stable from the prior study. Electronically Signed   By: Alcide Clever M.D.   On: 10/04/2023 22:06   DG Thoracic Spine 2 View  Result Date: 10/04/2023 CLINICAL DATA:  Recent fall with back pain, initial encounter EXAM: THORACIC SPINE 2 VIEWS COMPARISON:  None Available. FINDINGS: Vertebral body height is well maintained. Mild osteophytic changes are seen. Pedicles are within normal limits. No paraspinal mass is seen. IMPRESSION: Degenerative change without acute abnormality. Electronically Signed   By: Alcide Clever M.D.   On: 10/04/2023 22:03   CT Head Wo Contrast  Result Date: 10/04/2023 CLINICAL DATA:  Fall head injury EXAM: CT HEAD WITHOUT CONTRAST CT CERVICAL SPINE WITHOUT CONTRAST TECHNIQUE: Multidetector CT imaging of the head and cervical spine was performed following the  standard protocol without intravenous contrast. Multiplanar CT image reconstructions of the cervical spine were also generated. RADIATION DOSE REDUCTION: This exam was performed according to the departmental dose-optimization program which includes automated exposure control, adjustment of the mA and/or kV according to patient size and/or use of iterative reconstruction technique. COMPARISON:  None Available. FINDINGS: CT HEAD FINDINGS Brain: No evidence of acute infarction, hemorrhage, hydrocephalus, extra-axial collection or mass lesion/mass effect. Vascular: No hyperdense vessel or unexpected calcification. Skull: Normal. Negative for fracture  or focal lesion. Sinuses/Orbits: No acute finding. Other: None. CT CERVICAL SPINE FINDINGS Alignment: Degenerative straightening and reversal of the normal cervical lordosis. Skull base and vertebrae: No acute fracture. No primary bone lesion or focal pathologic process. Soft tissues and spinal canal: No prevertebral fluid or swelling. No visible canal hematoma. Disc levels: Moderate disc space height loss and osteophytosis from C5-C7. Upper chest: Negative. Other: None. IMPRESSION: 1. No acute intracranial pathology. 2. No fracture or static subluxation of the cervical spine. 3. Moderate disc space height loss and osteophytosis from C5-C7. Electronically Signed   By: Jearld Lesch M.D.   On: 10/04/2023 21:44   CT Cervical Spine Wo Contrast  Result Date: 10/04/2023 CLINICAL DATA:  Fall head injury EXAM: CT HEAD WITHOUT CONTRAST CT CERVICAL SPINE WITHOUT CONTRAST TECHNIQUE: Multidetector CT imaging of the head and cervical spine was performed following the standard protocol without intravenous contrast. Multiplanar CT image reconstructions of the cervical spine were also generated. RADIATION DOSE REDUCTION: This exam was performed according to the departmental dose-optimization program which includes automated exposure control, adjustment of the mA and/or kV according to  patient size and/or use of iterative reconstruction technique. COMPARISON:  None Available. FINDINGS: CT HEAD FINDINGS Brain: No evidence of acute infarction, hemorrhage, hydrocephalus, extra-axial collection or mass lesion/mass effect. Vascular: No hyperdense vessel or unexpected calcification. Skull: Normal. Negative for fracture or focal lesion. Sinuses/Orbits: No acute finding. Other: None. CT CERVICAL SPINE FINDINGS Alignment: Degenerative straightening and reversal of the normal cervical lordosis. Skull base and vertebrae: No acute fracture. No primary bone lesion or focal pathologic process. Soft tissues and spinal canal: No prevertebral fluid or swelling. No visible canal hematoma. Disc levels: Moderate disc space height loss and osteophytosis from C5-C7. Upper chest: Negative. Other: None. IMPRESSION: 1. No acute intracranial pathology. 2. No fracture or static subluxation of the cervical spine. 3. Moderate disc space height loss and osteophytosis from C5-C7. Electronically Signed   By: Jearld Lesch M.D.   On: 10/04/2023 21:44    Procedures Procedures    Medications Ordered in ED Medications  HYDROcodone-acetaminophen (NORCO/VICODIN) 5-325 MG per tablet 1 tablet (1 tablet Oral Given 10/04/23 2158)  bacitracin ointment (1 Application Topical Given 10/04/23 2159)  Tdap (BOOSTRIX) injection 0.5 mL (0.5 mLs Intramuscular Given 10/04/23 2159)    ED Course/ Medical Decision Making/ A&P Clinical Course as of 10/04/23 2242  Sun Oct 04, 2023  2209 Lumbar and thoracic spine x-rays without acute findings.  CT head and C-spine without acute abnormality [JK]  2241 Patient mentioned to me she does have some mild swelling to her ankle.  She does not have any bony tenderness to palpation.  Patient declines ankle x-rays [JK]    Clinical Course User Index [JK] Linwood Dibbles, MD                                 Medical Decision Making Problems Addressed: Fall, initial encounter: acute illness or injury  that poses a threat to life or bodily functions  Amount and/or Complexity of Data Reviewed Radiology: ordered and independent interpretation performed.  Risk OTC drugs. Prescription drug management.   She presented to the ED for evaluation after a fall.  ED workup reassuring.  No signs of fracture or dislocation.  CT scan of the head does not show any signs of serious injury.  No signs of cervical spine fracture.  Patient has abrasions that were cleaned in the ED.  Dressing applied.  Evaluation and diagnostic testing in the emergency department does not suggest an emergent condition requiring admission or immediate intervention beyond what has been performed at this time.  The patient is safe for discharge and has been instructed to return immediately for worsening symptoms, change in symptoms or any other concerns.        Final Clinical Impression(s) / ED Diagnoses Final diagnoses:  Fall, initial encounter    Rx / DC Orders ED Discharge Orders     None         Linwood Dibbles, MD 10/04/23 2243

## 2023-10-27 ENCOUNTER — Ambulatory Visit (INDEPENDENT_AMBULATORY_CARE_PROVIDER_SITE_OTHER): Payer: MEDICAID | Admitting: Mental Health

## 2023-10-27 DIAGNOSIS — F4310 Post-traumatic stress disorder, unspecified: Secondary | ICD-10-CM

## 2023-10-27 DIAGNOSIS — F322 Major depressive disorder, single episode, severe without psychotic features: Secondary | ICD-10-CM

## 2023-10-27 DIAGNOSIS — F411 Generalized anxiety disorder: Secondary | ICD-10-CM

## 2023-10-27 NOTE — Progress Notes (Signed)
   THERAPIST PROGRESS NOTE  Session Time: 3:05 pm ( 50 minutes)  Participation Level: Active  Behavioral Response: CasualAlertDepressed and Dysphoric  Type of Therapy: Individual Therapy  Treatment Goals addressed: STG: Veronica [name] will decrease sxs of depression AEB development of x 5 effective coping skills with ability to reframe maladaptive thinking patterns 7/7 days within the next 6 months     ProgressTowards Goals: Progressing  Interventions: Supportive  Summary: Veronica [name] is a 62 y.o. female who presents with hx of dx of depression and generalized anxiety. Veronica [name] presents alert and oriented; mood and affect low; depressed. Speech clear and coherent at normal rate and tone. Engaged and receptive to interventions. Shares with therapist current stressors of currently being homeless with her and her husband residing with her mother. Notes for lease to have ended and for new place to be on hold as they work to get her husband engaged with [name] program with inability to move in with him until his approval goes through. Shares to have provided all needed paperwork however things to be moving along slowly,  They do not all talk to each there . Notes difficulty in residing with mother with mother making comments about her husband. Shares feelings of stress and increased depression with working to cope and navigate with engagement in self-care practices and getting out of the house when she is able. Notes to have also experienced a fall at a church in which she twisted knee, foot and to have hit head on pavement; unclear of how she fell and just to have felt a nudge and to have fallen. Reports attempting to maintain a positive spirit however is difficulty. Shares hopeful with new year and holiday season passing able to move into housing as needed individuals in her case return to work. Denies SI/HI, medication compliant. Increase in sxs secondary to stressors working to maintain progress with  goals.    Suicidal/Homicidal: Nowithout intent/plan  Therapist Response: Therapist engaged Veronica [name] in therapy session. Assessed for current level of functioning, sxs management and level of stressors. Provided support and encouragement validated feelings and provided supportive feedback. Supported in processing current stressors, providing supportive feedback. Active empathic listening. Supported in processing feelings of frustration with current state of homelessness and challenging of residing with mother. Explored ability to cope and assessed for safety concerns provided support and encouragement with feelings of hope as workers return to work following holiday breaks. Reviewed session and provided follow up.   Plan: Return again in  x6 weeks.  Diagnosis: Severe major depression without psychotic features (HCC)  Generalized anxiety disorder  Post traumatic stress disorder  Collaboration of Care: Other None  Patient/Guardian was advised Release of Information must be obtained prior to any record release in order to collaborate their care with an outside provider. Patient/Guardian was advised if they have not already done so to contact the registration department to sign all necessary forms in order for us  to release information regarding their care.   Consent: Patient/Guardian gives verbal consent for treatment and assignment of benefits for services provided during this visit. Patient/Guardian expressed understanding and agreed to proceed.   Veronica [name], [facility] 10/27/2023

## 2023-11-03 ENCOUNTER — Ambulatory Visit (INDEPENDENT_AMBULATORY_CARE_PROVIDER_SITE_OTHER): Payer: MEDICAID | Admitting: Student

## 2023-11-03 DIAGNOSIS — F411 Generalized anxiety disorder: Secondary | ICD-10-CM | POA: Diagnosis not present

## 2023-11-03 DIAGNOSIS — F331 Major depressive disorder, recurrent, moderate: Secondary | ICD-10-CM | POA: Diagnosis not present

## 2023-11-03 DIAGNOSIS — F4310 Post-traumatic stress disorder, unspecified: Secondary | ICD-10-CM | POA: Diagnosis not present

## 2023-11-03 MED ORDER — DULOXETINE HCL 60 MG PO CPEP: 120.0000 mg | ORAL_CAPSULE | Freq: Every day | ORAL | 0 refills | Status: DC

## 2023-11-03 NOTE — Progress Notes (Addendum)
 BH MD Outpatient Progress Note  11/03/2023 10:28 AM Veronica [name]  MRN:  [id number]  Assessment:  Veronica [name] presents for follow-up evaluation in-person. Today, 11/03/23, patient reports feeling more depressive symptoms despite medication compliance given multiple psychosocial stressors. Plan to increase duloxetine  to 120 mg given partial benefit at 90 mg. Plan to monitor for stabilization of mood or may need to explore adding an adjunct to her medication regiment or changing it to a different medication entirely.   Identifying Information: Veronica [name] is a 63 y.o. y.o. female with a history of MDD, GAD, PTSD, fibromyalgia who presents in person to [facility] for medication management.    Plan: # Major Depressive Disorder Past medication trials: Prozac  (memory difficulties, ineffective), mirtazapine  (weight gain) Status of problem: not improving Interventions: -- Increase duloxetine  to 120 mg daily for depression and anxiety             -BMP reviewed from 06/08/23, Na 138, eGFR 79 -- Continue therapy with Bernice Rao  # Generalized Anxiety Disorder Past medication trials:  Status of problem: stable Interventions: -- Duloxetine  as above   # PTSD Past medication trials:  Status of problem: stable Interventions: -- Duloxetine  as above  Patient was given contact information for behavioral health clinic and was instructed to call 911 for emergencies.   Subjective:  Chief Complaint:  Chief Complaint  Patient presents with   Medication Management    Interval History:  Patient reports continuing to struggle with psychosocial stressors related to a fall, bedbugs in their recently vacated apartment, and housing instability. She has been sleeping more and feeling more depressed.  She denies SI/HI/AVH and continues to have religion very strong protective factor for her.  She reports that her husband continues to be a strong support for her.  Her  appetite and sleep have varied depending on how stressed she feels.  She does feel that once she has moved into her new apartment she will feel relieved and her mood will improve.  Visit Diagnosis:    ICD-10-CM   1. Moderate episode of recurrent major depressive disorder (HCC)  F33.1 DULoxetine  (CYMBALTA ) 60 MG capsule    2. Generalized anxiety disorder  F41.1     3. Post traumatic stress disorder  F43.10         Past Medical History:  Past Medical History:  Diagnosis Date   Anxiety    Arthritis    Asthma    Coronary artery disease    nonobstructive by 2010 cath   Cough    Depression    Diabetes mellitus    GERD (gastroesophageal reflux disease)    years ago   H/O diverticulitis of colon    Heart attack (HCC) 08/2009   Neuropathy    OSA (obstructive sleep apnea)    pt denies    Pneumonia    Sciatica    Urticaria     Past Surgical History:  Procedure Laterality Date   COLONOSCOPY     NASAL RECONSTRUCTION     ORIF PATELLA Left 10/09/2017   Procedure: PATELLA TENDON REPAIR;  Surgeon: Sharl Selinda Dover, MD;  Location: [facility] OR;  Service: Orthopedics;  Laterality: Left;  120 MINS   PARTIAL COLECTOMY     PATELLAR TENDON REPAIR Left 10/09/2017   TONSILLECTOMY AND ADENOIDECTOMY     UVULOPALATOPHARYNGOPLASTY       Family History:  Family History  Problem Relation Age of Onset   Allergic rhinitis Mother    Diabetes  Mother        type 2   Diabetes Brother    Congestive Heart Failure Brother    Emphysema Paternal Grandfather    Tuberculosis Paternal Grandfather    Diabetes Other        cousin type 1    Social History:  Social History   Socioeconomic History   Marital status: Legally Separated    Spouse name: Not on file   Number of children: Not on file   Years of education: Not on file   Highest education level: Not on file  Occupational History   Not on file  Tobacco Use   Smoking status: Former    Current packs/day: 0.00    Types: Cigarettes     Start date: 10/28/1983    Quit date: 10/28/1987    Years since quitting: 36.0    Passive exposure: Past   Smokeless tobacco: Never   Tobacco comments:    1 pack per 2 months  Vaping Use   Vaping status: Never Used  Substance and Sexual Activity   Alcohol use: No    Alcohol/week: 0.0 standard drinks of alcohol   Drug use: No   Sexual activity: Not Currently    Birth control/protection: None  Other Topics Concern   Not on file  Social History Narrative   Originally from [place]. Always lived in [place]. Prior travel to [place], [place]. 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 She has no pets currently. No bird or hot tub exposure. She reports that there has been mold in her basement before that was treated twice. She also has mold in her bathroom & bedroom.    Social Drivers of Health   Financial Resource Strain: Medium Risk (06/16/2022)   Overall Financial Resource Strain (CARDIA)    Difficulty of Paying Living Expenses: Somewhat hard  Food Insecurity: Food Insecurity Present (07/23/2023)   Hunger Vital Sign    Worried About Running Out of Food in the Last Year: Sometimes true    Ran Out of Food in the Last Year: Often true  Transportation Needs: No Transportation Needs (07/23/2023)   PRAPARE - Administrator, Civil Service (Medical): No    Lack of Transportation (Non-Medical): No  Physical Activity: Inactive (06/16/2022)   Exercise Vital Sign    Days of Exercise per Week: 0 days    Minutes of Exercise per Session: 0 min  Stress: Stress Concern Present (06/16/2022)   [employer] of Occupational Health - Occupational Stress Questionnaire    Feeling of Stress : Very much  Social Connections: Moderately Isolated (06/16/2022)   Social Connection and Isolation Panel [NHANES]    Frequency of Communication with Friends and Family: Twice a week    Frequency of Social Gatherings with Friends and Family: Twice a week     Attends Religious Services: More than 4 times per year    Active Member of [employer] or Organizations: No    Attends Banker Meetings: Never    Marital Status: Separated    Allergies: No Known Allergies  Current Medications: Current Outpatient Medications  Medication Sig Dispense Refill   albuterol  (VENTOLIN  HFA) 108 (90 Base) MCG/ACT inhaler Inhale 2 puffs into the lungs every 4 (four) hours as needed for wheezing or shortness of breath.     aspirin  81 MG chewable tablet Chew 81 mg by mouth daily.     Azelastine HCl 137 MCG/SPRAY  SOLN Place into the nose.     cyclobenzaprine  (FLEXERIL ) 10 MG tablet Take 10 mg by mouth daily as needed for muscle spasms.     DULoxetine  (CYMBALTA ) 60 MG capsule Take 2 capsules (120 mg total) by mouth daily. 240 capsule 0   fexofenadine (ALLEGRA) 180 MG tablet Take by mouth.     furosemide  (LASIX ) 20 MG tablet Take 20 mg by mouth daily.     gabapentin  (NEURONTIN ) 100 MG capsule Take 100 mg by mouth 2 (two) times daily.     LANTUS  100 UNIT/ML injection 27 Units 2 (two) times daily.     Menthol, Topical Analgesic, (BIOFREEZE) 4 % GEL Apply 1 application  topically 2 (two) times daily as needed (pain in lower legs/hands). 5%     metFORMIN (GLUCOPHAGE) 500 MG tablet Take 500 mg by mouth 2 (two) times daily.     MOUNJARO 12.5 MG/0.5ML Pen Inject 12.5 mg into the skin once a week.     No current facility-administered medications for this visit.     Objective:  Psychiatric Specialty Exam: There were no vitals taken for this visit.There is no height or weight on file to calculate BMI.  General Appearance: Well Groomed  Eye Contact:  Good  Speech:  Clear and Coherent and Normal Rate  Volume:  Normal  Mood:  Depressed  Affect:  Depressed and Tearful  Thought Process:  Coherent, Goal Directed, and Linear  Orientation:  Full (Time, Place, and Person)  Thought Content: Logical   Suicidal Thoughts:  No  Homicidal Thoughts:  No  Memory:  Remote;    Good  Judgment:  Good  Insight:  Good  Psychomotor Activity:  Normal  Concentration:  Concentration: Good and Attention Span: Good              Assets:  Communication Skills Desire for Improvement Financial Resources/Insurance Housing Intimacy Leisure Time Physical Health Resilience Social Support Talents/Skills Transportation Vocational/Educational  ADL's:  Intact  Cognition: WNL  Sleep:  Good   PE: General: well-appearing; no acute distress  Pulm: no increased work of breathing on room air  Strength & Muscle Tone: within normal limits Neuro: no focal neurological deficits observed  Gait & Station: normal  Metabolic Disorder Labs: Lab Results  Component Value Date   HGBA1C 10.3 (H) 11/15/2016   MPG 249 11/15/2016   MPG 243 11/14/2016   No results found for: PROLACTIN Lab Results  Component Value Date   CHOL  08/31/2009    189        ATP III CLASSIFICATION:  <200     mg/dL   Desirable  799-760  mg/dL   Borderline High  >=759    mg/dL   High          TRIG 59 08/31/2009   HDL 59 08/31/2009   CHOLHDL 3.2 08/31/2009   VLDL 12 08/31/2009   LDLCALC (H) 08/31/2009    118        Total Cholesterol/HDL:CHD Risk Coronary Heart Disease Risk Table                     Men   Women  1/2 Average Risk   3.4   3.3  Average Risk       5.0   4.4  2 X Average Risk   9.6   7.1  3 X Average Risk  23.4   11.0        Use the calculated Patient Ratio above and the CHD  Risk Table to determine the patient's CHD Risk.        ATP III CLASSIFICATION (LDL):  <100     mg/dL   Optimal  899-870  mg/dL   Near or Above                    Optimal  130-159  mg/dL   Borderline  839-810  mg/dL   High  >809     mg/dL   Very High   LDLCALC 881 (H) 06/26/2009     Therapeutic Level Labs: No results found for: LITHIUM No results found for: VALPROATE No results found for: CBMZ  Screenings: GAD-7    Flowsheet Row Office Visit from 07/23/2023 in [facility] Counselor from 02/19/2023 in [facility] Counselor from 06/16/2022 in [facility]  Total GAD-7 Score 6 13 20       PHQ2-9    Flowsheet Row Office Visit from 07/23/2023 in Center for [facility] Counselor from 07/02/2023 in [facility] Counselor from 02[facility]19/2023 in [facility] Counselor from 06/16/2022 in [facility]  PHQ-2 Total Score 5 6 3 6 4   PHQ-9 Total Score 14 24 14 16 15       Flowsheet Row ED from 10/04/2023 in [facility] at [facility] Counselor from 06/16/2022 in [facility]  C-SSRS RISK CATEGORY No Risk No Risk       Collaboration of Care: Collaboration of Care:   Patient/Guardian was advised Release of Information must be obtained prior to any record release in order to collaborate their care with an outside provider. Patient/Guardian was advised if they have not already done so to contact the registration department to sign all necessary forms in order for us  to release information regarding their care.   Consent: Patient/Guardian gives verbal consent for treatment and assignment of benefits for services provided during this visit. Patient/Guardian expressed understanding and agreed to proceed.   A total of 35 minutes was spent involved in face to face clinical care, chart review, and documentation.   Prentice Espy, MD 11/03/2023, 10:28 AM

## 2023-12-31 ENCOUNTER — Ambulatory Visit (HOSPITAL_COMMUNITY): Payer: MEDICAID | Admitting: Student

## 2023-12-31 DIAGNOSIS — F331 Major depressive disorder, recurrent, moderate: Secondary | ICD-10-CM

## 2023-12-31 MED ORDER — DULOXETINE HCL 60 MG PO CPEP: 120.0000 mg | ORAL_CAPSULE | Freq: Every day | ORAL | 0 refills | Status: DC

## 2023-12-31 NOTE — Progress Notes (Signed)
 BH MD Outpatient Progress Note  12/31/2023 5:36 PM CATHLENE GARDELLA  MRN:  [id number]  Assessment:  Veronica [name] presents for follow-up evaluation in-person. Today, 12/31/23, patient reports overall stable with regards to mood and anxiety.  She is finally moved into her new location with her husband which has been much better than her prior living situation with moldy apartment and with bedbugs.  No psychotropic adjustments were made today given overall stability.  Patient is to follow-up in 2 months for continued stability.  Identifying Information: Veronica [name] is a 63 y.o. y.o. female with a history of MDD, GAD, PTSD, fibromyalgia who presents in person to [facility] for medication management.    Plan: # Major Depressive Disorder Past medication trials: Prozac (memory difficulties, ineffective), mirtazapine (weight gain) Status of problem: not improving Interventions: -- Continue duloxetine 120 mg daily for depression and anxiety             -BMP reviewed from 06/08/23, Na 138, eGFR 79 -- Continue therapy with Stephan Minister  # Generalized Anxiety Disorder Past medication trials:  Status of problem: stable Interventions: -- Duloxetine as above   # PTSD Past medication trials:  Status of problem: stable Interventions: -- Duloxetine as above  Patient was given contact information for behavioral health clinic and was instructed to call 911 for emergencies.   Subjective:  Chief Complaint:  Medication adjustment  Interval History:  Patient reports overall doing well.  She has recently moved in with her husband to a new apartment which is much cleaner and better.  She has been feeling better since moving out of the apartment given she reports several medical problems arise due to living in the apartment.  She denies SI/HI/AVH.  She is eating and sleeping appropriately.  Patient would like to continue current medication regimen.  Visit Diagnosis:     ICD-10-CM   1. Moderate episode of recurrent major depressive disorder (HCC)  F33.1 DULoxetine (CYMBALTA) 60 MG capsule         Past Medical History:  Past Medical History:  Diagnosis Date   Anxiety    Arthritis    Asthma    Coronary artery disease    nonobstructive by 2010 cath   Cough    Depression    Diabetes mellitus    GERD (gastroesophageal reflux disease)    years ago   H/O diverticulitis of colon    Heart attack (HCC) 08/2009   Neuropathy    OSA (obstructive sleep apnea)    pt denies    Pneumonia    Sciatica    Urticaria     Past Surgical History:  Procedure Laterality Date   COLONOSCOPY     NASAL RECONSTRUCTION     ORIF PATELLA Left 10/09/2017   Procedure: PATELLA TENDON REPAIR;  Surgeon: Yolonda Kida, MD;  Location: [facility] OR;  Service: Orthopedics;  Laterality: Left;  120 MINS   PARTIAL COLECTOMY     PATELLAR TENDON REPAIR Left 10/09/2017   TONSILLECTOMY AND ADENOIDECTOMY     UVULOPALATOPHARYNGOPLASTY       Family History:  Family History  Problem Relation Age of Onset   Allergic rhinitis Mother    Diabetes Mother        type 2   Diabetes Brother    Congestive Heart Failure Brother    Emphysema Paternal Grandfather    Tuberculosis Paternal Grandfather    Diabetes Other        cousin type 1  Social History:  Social History   Socioeconomic History   Marital status: Legally Separated    Spouse name: Not on file   Number of children: Not on file   Years of education: Not on file   Highest education level: Not on file  Occupational History   Not on file  Tobacco Use   Smoking status: Former    Current packs/day: 0.00    Types: Cigarettes    Start date: 10/28/1983    Quit date: 10/28/1987    Years since quitting: 36.2    Passive exposure: Past   Smokeless tobacco: Never   Tobacco comments:    1 pack per 2 months  Vaping Use   Vaping status: Never Used  Substance and Sexual Activity   Alcohol use: No    Alcohol/week: 0.0  standard drinks of alcohol   Drug use: No   Sexual activity: Not Currently    Birth control/protection: None  Other Topics Concern   Not on file  Social History Narrative   Originally from [place]. Always lived in [place]. Prior travel to [place], [place] & [place]. 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 She has no pets currently. No bird or hot tub exposure. She reports that there has been mold in her basement before that was treated twice. She also has mold in her bathroom & bedroom.    Social Drivers of Health   Financial Resource Strain: Medium Risk (06/16/2022)   Overall Financial Resource Strain (CARDIA)    Difficulty of Paying Living Expenses: Somewhat hard  Food Insecurity: High Risk (12/05/2023)   Received from [facility]   Hunger Vital Sign    Worried About Running Out of Food in the Last Year: Often true    Ran Out of Food in the Last Year: Often true  Transportation Needs: No Transportation Needs (12/05/2023)   Received from [facility]    In the past 12 months, has lack of reliable transportation kept you from medical appointments, meetings, work or from getting things needed for daily living? : No  Physical Activity: Inactive (06/16/2022)   Exercise Vital Sign    Days of Exercise per Week: 0 days    Minutes of Exercise per Session: 0 min  Stress: Stress Concern Present (06/16/2022)   [employer] of Occupational Health - Occupational Stress Questionnaire    Feeling of Stress : Very much  Social Connections: Moderately Isolated (06/16/2022)   Social Connection and Isolation Panel [NHANES]    Frequency of Communication with Friends and Family: Twice a week    Frequency of Social Gatherings with Friends and Family: Twice a week    Attends Religious Services: More than 4 times per year    Active Member of [employer] or Organizations: No    Attends Banker Meetings: Never    Marital Status:  Separated    Allergies: No Known Allergies  Current Medications: Current Outpatient Medications  Medication Sig Dispense Refill   albuterol (VENTOLIN HFA) 108 (90 Base) MCG/ACT inhaler Inhale 2 puffs into the lungs every 4 (four) hours as needed for wheezing or shortness of breath.     aspirin 81 MG chewable tablet Chew 81 mg by mouth daily.     Azelastine HCl 137 MCG/SPRAY SOLN Place into the nose.     cyclobenzaprine (FLEXERIL) 10 MG tablet Take 10 mg by mouth daily as needed for muscle spasms.     [  START ON 02/25/2024] DULoxetine (CYMBALTA) 60 MG capsule Take 2 capsules (120 mg total) by mouth daily. 180 capsule 0   fexofenadine (ALLEGRA) 180 MG tablet Take by mouth.     furosemide (LASIX) 20 MG tablet Take 20 mg by mouth daily.     gabapentin (NEURONTIN) 100 MG capsule Take 100 mg by mouth 2 (two) times daily.     LANTUS 100 UNIT/ML injection 27 Units 2 (two) times daily.     Menthol, Topical Analgesic, (BIOFREEZE) 4 % GEL Apply 1 application  topically 2 (two) times daily as needed (pain in lower legs/hands). 5%     metFORMIN (GLUCOPHAGE) 500 MG tablet Take 500 mg by mouth 2 (two) times daily.     MOUNJARO 12.5 MG/0.5ML Pen Inject 12.5 mg into the skin once a week.     No current facility-administered medications for this visit.     Objective:  Psychiatric Specialty Exam: There were no vitals taken for this visit.There is no height or weight on file to calculate BMI.  General Appearance: Well Groomed  Eye Contact:  Good  Speech:  Clear and Coherent and Normal Rate  Volume:  Normal  Mood:  Depressed  Affect:  Depressed and Tearful  Thought Process:  Coherent, Goal Directed, and Linear  Orientation:  Full (Time, Place, and Person)  Thought Content: Logical   Suicidal Thoughts:  No  Homicidal Thoughts:  No  Memory:  Remote;   Good  Judgment:  Good  Insight:  Good  Psychomotor Activity:  Normal  Concentration:  Concentration: Good and Attention Span: Good               Assets:  Communication Skills Desire for Improvement Financial Resources/Insurance Housing Intimacy Leisure Time Physical Health Resilience Social Support Talents/Skills Transportation Vocational/Educational  ADL's:  Intact  Cognition: WNL  Sleep:  Good   PE: General: well-appearing; no acute distress  Pulm: no increased work of breathing on room air  Strength & Muscle Tone: within normal limits Neuro: no focal neurological deficits observed  Gait & Station: normal  Metabolic Disorder Labs: Lab Results  Component Value Date   HGBA1C 10.3 (H) 11/15/2016   MPG 249 11/15/2016   MPG 243 11/14/2016   No results found for: "PROLACTIN" Lab Results  Component Value Date   CHOL  08/31/2009    189        ATP III CLASSIFICATION:  <200     mg/dL   Desirable  161-096  mg/dL   Borderline High  >=045    mg/dL   High          TRIG 59 08/31/2009   HDL 59 08/31/2009   CHOLHDL 3.2 08/31/2009   VLDL 12 08/31/2009   LDLCALC (H) 08/31/2009    118        Total Cholesterol/HDL:CHD Risk Coronary Heart Disease Risk Table                     Men   Women  1/2 Average Risk   3.4   3.3  Average Risk       5.0   4.4  2 X Average Risk   9.6   7.1  3 X Average Risk  23.4   11.0        Use the calculated Patient Ratio above and the CHD Risk Table to determine the patient's CHD Risk.        ATP III CLASSIFICATION (LDL):  <100     mg/dL  Optimal  100-129  mg/dL   Near or Above                    Optimal  130-159  mg/dL   Borderline  161-096  mg/dL   High  >045     mg/dL   Very High   LDLCALC 409 (H) 06/26/2009     Therapeutic Level Labs: No results found for: "LITHIUM" No results found for: "VALPROATE" No results found for: "CBMZ"  Screenings: GAD-7    Flowsheet Row Office Visit from 07/23/2023 in [facility] [employer] [facility] Counselor from [facility] from 06/16/2022 in [facility]  Total GAD-7 Score 6 13 20       PHQ2-9    Flowsheet Row Office Visit from 07/23/2023 in Center for [facility] for Women Counselor from 07/02/2023 in [facility] Counselor from 02/19/2023 in [facility] Counselor from 06/16/2022 in [facility]  PHQ-2 Total Score 5 6 3 6 4   PHQ-9 Total Score 14 24 14 16 15       Flowsheet Row ED from 10/04/2023 in [facility] Emergency Department at [facility] Counselor from 06/16/2022 in [facility]  C-SSRS RISK CATEGORY No Risk No Risk       Collaboration of Care: Collaboration of Care:   Patient/Guardian was advised Release of Information must be obtained prior to any record release in order to collaborate their care with an outside provider. Patient/Guardian was advised if they have not already done so to contact the registration department to sign all necessary forms in order for [place] to release information regarding their care.   Consent: Patient/Guardian gives verbal consent for treatment and assignment of benefits for services provided during this visit. Patient/Guardian expressed understanding and agreed to proceed.   A total of 35 minutes was spent involved in face to face clinical care, chart review, and documentation.   Park Pope, MD 12/31/2023, 5:36 PM

## 2024-02-25 ENCOUNTER — Encounter (HOSPITAL_COMMUNITY): Payer: MEDICAID | Admitting: Student

## 2024-02-28 NOTE — Progress Notes (Deleted)
 BH MD Outpatient Progress Note  02/28/2024 4:31 PM [name]  MRN:  [id number]  Assessment:  Veronica [name] presents for follow-up evaluation in-person. Today, 02/28/24, patient reports overall stable with regards to mood and anxiety.  She is finally moved into her new location with her husband which has been much better than her prior living situation with moldy apartment and with bedbugs.  No psychotropic adjustments were made today given overall stability.  Patient is to follow-up in 2 months for continued stability.  Identifying Information: Veronica [name] is a 63 y.o. y.o. female with a history of MDD, GAD, PTSD, fibromyalgia who presents in person to [facility] for medication management.    Plan: # Major Depressive Disorder Past medication trials: Prozac  (memory difficulties, ineffective), mirtazapine  (weight gain) Status of problem: not improving Interventions: -- Continue duloxetine  120 mg daily for depression and anxiety             -BMP reviewed from 06/08/23, Na 138, eGFR 79 -- Continue therapy with Carmel Chimes  # Generalized Anxiety Disorder Past medication trials:  Status of problem: stable Interventions: -- Duloxetine  as above   # PTSD Past medication trials:  Status of problem: stable Interventions: -- Duloxetine  as above  Patient was given contact information for behavioral health clinic and was instructed to call 911 for emergencies.   Subjective:  Chief Complaint:  Medication adjustment  Interval History:  Patient reports overall doing well.  She has recently moved in with her husband to a new apartment which is much cleaner and better.  She has been feeling better since moving out of the apartment given she reports several medical problems arise due to living in the apartment.  She denies SI/HI/AVH.  She is eating and sleeping appropriately.  Patient would like to continue current medication regimen.  Visit Diagnosis:  No  diagnosis found.      Past Medical History:  Past Medical History:  Diagnosis Date   Anxiety    Arthritis    Asthma    Coronary artery disease    nonobstructive by 2010 cath   Cough    Depression    Diabetes mellitus    GERD (gastroesophageal reflux disease)    years ago   H/O diverticulitis of colon    Heart attack (HCC) 08/2009   Neuropathy    OSA (obstructive sleep apnea)    pt denies    Pneumonia    Sciatica    Urticaria     Past Surgical History:  Procedure Laterality Date   COLONOSCOPY     NASAL RECONSTRUCTION     ORIF PATELLA Left 10/09/2017   Procedure: PATELLA TENDON REPAIR;  Surgeon: Janeth Medicus, MD;  Location: [facility] OR;  Service: Orthopedics;  Laterality: Left;  120 MINS   PARTIAL COLECTOMY     PATELLAR TENDON REPAIR Left 10/09/2017   TONSILLECTOMY AND ADENOIDECTOMY     UVULOPALATOPHARYNGOPLASTY       Family History:  Family History  Problem Relation Age of Onset   Allergic rhinitis Mother    Diabetes Mother        type 2   Diabetes Brother    Congestive Heart Failure Brother    Emphysema Paternal Grandfather    Tuberculosis Paternal Grandfather    Diabetes Other        cousin type 1    Social History:  Social History   Socioeconomic History   Marital status: Legally Separated    Spouse name: Not  on file   Number of children: Not on file   Years of education: Not on file   Highest education level: Not on file  Occupational History   Not on file  Tobacco Use   Smoking status: Former    Current packs/day: 0.00    Types: Cigarettes    Start date: 10/28/1983    Quit date: 10/28/1987    Years since quitting: 36.3    Passive exposure: Past   Smokeless tobacco: Never   Tobacco comments:    1 pack per 2 months  Vaping Use   Vaping status: Never Used  Substance and Sexual Activity   Alcohol use: No    Alcohol/week: 0.0 standard drinks of alcohol   Drug use: No   Sexual activity: Not Currently    Birth control/protection: None   Other Topics Concern   Not on file  Social History Narrative   Originally from [place]. Always lived in [place]. Prior travel to [place], [place] & [place]. 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 She has no pets currently. No bird or hot tub exposure. She reports that there has been mold in her basement before that was treated twice. She also has mold in her bathroom & bedroom.    Social Drivers of Health   Financial Resource Strain: Medium Risk (06/16/2022)   Overall Financial Resource Strain (CARDIA)    Difficulty of Paying Living Expenses: Somewhat hard  Food Insecurity: High Risk (12/05/2023)   Received from [facility]   Hunger Vital Sign    Worried About Running Out of Food in the Last Year: Often true    Ran Out of Food in the Last Year: Often true  Transportation Needs: No Transportation Needs (12/05/2023)   Received from [facility]    In the past 12 months, has lack of reliable transportation kept you from medical appointments, meetings, work or from getting things needed for daily living? : No  Physical Activity: Inactive (06/16/2022)   Exercise Vital Sign    Days of Exercise per Week: 0 days    Minutes of Exercise per Session: 0 min  Stress: Stress Concern Present (06/16/2022)   [employer] of Occupational Health - Occupational Stress Questionnaire    Feeling of Stress : Very much  Social Connections: Moderately Isolated (06/16/2022)   Social Connection and Isolation Panel [NHANES]    Frequency of Communication with Friends and Family: Twice a week    Frequency of Social Gatherings with Friends and Family: Twice a week    Attends Religious Services: More than 4 times per year    Active Member of [employer] or Organizations: No    Attends Banker Meetings: Never    Marital Status: Separated    Allergies: No Known Allergies  Current Medications: Current Outpatient Medications  Medication  Sig Dispense Refill   albuterol  (VENTOLIN  HFA) 108 (90 Base) MCG/ACT inhaler Inhale 2 puffs into the lungs every 4 (four) hours as needed for wheezing or shortness of breath.     aspirin  81 MG chewable tablet Chew 81 mg by mouth daily.     Azelastine HCl 137 MCG/SPRAY SOLN Place into the nose.     cyclobenzaprine  (FLEXERIL ) 10 MG tablet Take 10 mg by mouth daily as needed for muscle spasms.     DULoxetine  (CYMBALTA ) 60 MG capsule Take 2 capsules (120 mg total) by mouth daily. 180 capsule 0   fexofenadine (  ALLEGRA) 180 MG tablet Take by mouth.     furosemide  (LASIX ) 20 MG tablet Take 20 mg by mouth daily.     gabapentin  (NEURONTIN ) 100 MG capsule Take 100 mg by mouth 2 (two) times daily.     LANTUS  100 UNIT/ML injection 27 Units 2 (two) times daily.     Menthol, Topical Analgesic, (BIOFREEZE) 4 % GEL Apply 1 application  topically 2 (two) times daily as needed (pain in lower legs/hands). 5%     metFORMIN (GLUCOPHAGE) 500 MG tablet Take 500 mg by mouth 2 (two) times daily.     MOUNJARO 12.5 MG/0.5ML Pen Inject 12.5 mg into the skin once a week.     No current facility-administered medications for this visit.     Objective:  Psychiatric Specialty Exam: There were no vitals taken for this visit.There is no height or weight on file to calculate BMI.  General Appearance: Well Groomed  Eye Contact:  Good  Speech:  Clear and Coherent and Normal Rate  Volume:  Normal  Mood:  Depressed  Affect:  Depressed and Tearful  Thought Process:  Coherent, Goal Directed, and Linear  Orientation:  Full (Time, Place, and Person)  Thought Content: Logical   Suicidal Thoughts:  No  Homicidal Thoughts:  No  Memory:  Remote;   Good  Judgment:  Good  Insight:  Good  Psychomotor Activity:  Normal  Concentration:  Concentration: Good and Attention Span: Good              Assets:  Communication Skills Desire for Improvement Financial Resources/Insurance Housing Intimacy Leisure Time Physical  Health Resilience Social Support Talents/Skills Transportation Vocational/Educational  ADL's:  Intact  Cognition: WNL  Sleep:  Good   PE: General: well-appearing; no acute distress  Pulm: no increased work of breathing on room air  Strength & Muscle Tone: within normal limits Neuro: no focal neurological deficits observed  Gait & Station: normal  Metabolic Disorder Labs: Lab Results  Component Value Date   HGBA1C 10.3 (H) 11/15/2016   MPG 249 11/15/2016   MPG 243 11/14/2016   No results found for: "PROLACTIN" Lab Results  Component Value Date   CHOL  08/31/2009    189        ATP III CLASSIFICATION:  <200     mg/dL   Desirable  409-811  mg/dL   Borderline High  >=914    mg/dL   High          TRIG 59 08/31/2009   HDL 59 08/31/2009   CHOLHDL 3.2 08/31/2009   VLDL 12 08/31/2009   LDLCALC (H) 08/31/2009    118        Total Cholesterol/HDL:CHD Risk Coronary Heart Disease Risk Table                     Men   Women  1/2 Average Risk   3.4   3.3  Average Risk       5.0   4.4  2 X Average Risk   9.6   7.1  3 X Average Risk  23.4   11.0        Use the calculated Patient Ratio above and the CHD Risk Table to determine the patient's CHD Risk.        ATP III CLASSIFICATION (LDL):  <100     mg/dL   Optimal  782-956  mg/dL   Near or Above  Optimal  130-159  mg/dL   Borderline  161-096  mg/dL   High  >045     mg/dL   Very High   LDLCALC 409 (H) 06/26/2009     Therapeutic Level Labs: No results found for: "LITHIUM" No results found for: "VALPROATE" No results found for: "CBMZ"  Screenings: GAD-7    Flowsheet Row Office Visit from 07/23/2023 in [facility] for [employer] at [facility] for [facility] Counselor from 02/19/2023 in [facility] Counselor from 06/16/2022 in [facility]  Total GAD-7 Score 6 13 20       PHQ2-9    Flowsheet Row Office Visit from 07/23/2023 in Center for  [facility] Counselor from 07/02/2023 in [facility] Counselor from 02/19/2023 in [facility] Counselor from 06/16/2022 in [facility]  PHQ-2 Total Score 5 6 3 6 4   PHQ-9 Total Score 14 24 14 16 15       Flowsheet Row ED from 10/04/2023 in [facility] Emergency Department at [facility] Counselor from 06/16/2022 in [facility]  C-SSRS RISK CATEGORY No Risk No Risk       Collaboration of Care: Collaboration of Care:   Patient/Guardian was advised Release of Information must be obtained prior to any record release in order to collaborate their care with an outside provider. Patient/Guardian was advised if they have not already done so to contact the registration department to sign all necessary forms in order for us  to release information regarding their care.   Consent: Patient/Guardian gives verbal consent for treatment and assignment of benefits for services provided during this visit. Patient/Guardian expressed understanding and agreed to proceed.   A total of 35 minutes was spent involved in face to face clinical care, chart review, and documentation.   Augusta Blizzard, MD 02/28/2024, 4:31 PM

## 2024-03-01 ENCOUNTER — Encounter (HOSPITAL_COMMUNITY): Payer: MEDICAID | Admitting: Student

## 2024-03-02 ENCOUNTER — Encounter (HOSPITAL_COMMUNITY): Payer: MEDICAID | Admitting: Student

## 2024-03-03 ENCOUNTER — Ambulatory Visit (INDEPENDENT_AMBULATORY_CARE_PROVIDER_SITE_OTHER): Payer: MEDICAID | Admitting: Mental Health

## 2024-03-03 ENCOUNTER — Telehealth (HOSPITAL_COMMUNITY): Payer: Self-pay

## 2024-03-03 DIAGNOSIS — F331 Major depressive disorder, recurrent, moderate: Secondary | ICD-10-CM

## 2024-03-03 DIAGNOSIS — F4310 Post-traumatic stress disorder, unspecified: Secondary | ICD-10-CM

## 2024-03-03 NOTE — Progress Notes (Signed)
   THERAPIST PROGRESS NOTE Virtual Visit via Video Note  I connected with Veronica [name] on 03/03/2024 at  2:00 PM EDT by a video enabled telemedicine application and verified that I am speaking with the correct person using two identifiers.  Location: Patient: parking lot store- [employer] Provider: home office   I discussed the limitations of evaluation and management by telemedicine and the availability of in person appointments. The patient expressed understanding and agreed to proceed.  I discussed the assessment and treatment plan with the patient. The patient was provided an opportunity to ask questions and all were answered. The patient agreed with the plan and demonstrated an understanding of the instructions.   The patient was advised to call back or seek an in-person evaluation if the symptoms worsen or if the condition fails to improve as anticipated.  I provided 45 minutes of non-face-to-face time during this encounter.   Veronica [name], Veronica [name]   Session Time: 2:06 pm   Participation Level: Active  Behavioral Response: CasualAlertEuthymic  Type of Therapy: Individual Therapy  Treatment Goals addressed:  STG: Veronica [name] will decrease sxs of depression AEB development of x 5 effective coping skills with ability to reframe maladaptive thinking patterns 7/7 days within the next 6 months     ProgressTowards Goals: Progressing  Interventions: Strength-based and Supportive  Summary:Veronica [name] is a 63 y.o. female who presents with hx of dx of depression and generalized anxiety, PTSD. Veronica [name] presents alert and oriented; mood and affect euthymic. Speech clear and coherent at normal rate and tone. Shares to be doing well and to have moved into her new apartment with husband. Shares recently to have tried to cease taking medications to see if she needed in and became sad with increase in crying spells, " Now I know I need the medication." Shares to be engaging in  community with God daughter and shares to be being a support to her. Shares relationship with mother to be going well. 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 Notes use of coping skills of listening to jazz and not isolating self from others. Shares for thoughts to be balanced and denies highly negative thinking. Progress with goals; denies SI/HI.   Suicidal/Homicidal: Nowithout intent/plan  Therapist Response: Therapist engaged Veronica [name] in therapy session. Assessed for current level of functioning, sxs management and level of stressors. Provided support and encouragement validated feelings and provided supportive feedback. Supported in processing current move and stability in the community. Explores for concerns for depression and factors that have been supportive in managing. Encouraged working to remain future focused and enjoying days and engaging in community. Explores progress made in services. Reviewed session and provided follow up.   Plan: Return again in x 6 weeks.  Diagnosis: Moderate episode of recurrent major depressive disorder (HCC)  PTSD (post-traumatic stress disorder)  Collaboration of Care: Other None  Patient/Guardian was advised Release of Information must be obtained prior to any record release in order to collaborate their care with an outside provider. Patient/Guardian was advised if they have not already done so to contact the registration department to sign all necessary forms in order for us  to release information regarding their care.   Consent: Patient/Guardian gives verbal consent for treatment and assignment of benefits for services provided during this visit. Patient/Guardian expressed understanding and agreed to proceed.   Veronica [name], Veronica [name] 03/03/2024

## 2024-03-03 NOTE — Telephone Encounter (Signed)
 Hello pt is requesting for   Cymbalta  and gabapentin  refilled. States to be out and does not see Dr. Debria Fang til the 14th   Pt last seen in march.

## 2024-03-04 ENCOUNTER — Encounter (HOSPITAL_COMMUNITY): Payer: Self-pay | Admitting: Student

## 2024-03-04 DIAGNOSIS — F331 Major depressive disorder, recurrent, moderate: Secondary | ICD-10-CM

## 2024-03-04 MED ORDER — GABAPENTIN 100 MG PO CAPS: 100.0000 mg | ORAL_CAPSULE | Freq: Two times a day (BID) | ORAL | 0 refills | Status: AC

## 2024-03-04 MED ORDER — DULOXETINE HCL 60 MG PO CPEP
120.0000 mg | ORAL_CAPSULE | Freq: Every day | ORAL | 0 refills | Status: AC
Start: 2024-03-04 — End: 2024-06-02

## 2024-03-09 ENCOUNTER — Ambulatory Visit (HOSPITAL_COMMUNITY): Payer: MEDICAID | Admitting: Student

## 2024-03-09 DIAGNOSIS — F331 Major depressive disorder, recurrent, moderate: Secondary | ICD-10-CM | POA: Diagnosis not present

## 2024-03-09 DIAGNOSIS — Z79899 Other long term (current) drug therapy: Secondary | ICD-10-CM

## 2024-03-09 DIAGNOSIS — F4310 Post-traumatic stress disorder, unspecified: Secondary | ICD-10-CM | POA: Diagnosis not present

## 2024-03-09 DIAGNOSIS — M797 Fibromyalgia: Secondary | ICD-10-CM | POA: Diagnosis not present

## 2024-03-09 DIAGNOSIS — F411 Generalized anxiety disorder: Secondary | ICD-10-CM

## 2024-03-09 MED ORDER — DIVALPROEX SODIUM ER 250 MG PO TB24: 250.0000 mg | ORAL_TABLET | Freq: Every day | ORAL | 0 refills | Status: AC

## 2024-03-09 NOTE — Progress Notes (Unsigned)
 BH MD Outpatient Progress Note  03/10/2024 2:37 PM Veronica [name]  MRN:  [id number]  Assessment:  Veronica [name] presents for follow-up evaluation in-person. Today, 03/10/24, patient reports struggling with more symptoms of depression recently secondary to significant pain.  She reports staying in bed more and feeling fatigued during the daytime.  She expresses concern that patient's duloxetine  has plateaued in efficacy.  We discussed using adjunct treatment to aid with patient's ongoing symptoms of depression as well as neuropathy.  Discussed Lamictal, Depakote, Abilify as options Patient has not had a recent EKG and last one indicated prolonged Qtc which makes abilify/atypical antipsychotic more risky due to risk for QTc prolongation.  She was amenable to trialing Depakote ER for this. Plan to get a trough level in ~1 month.   Identifying Information: Veronica [name] is a 63 y.o. y.o. female with a history of MDD, GAD, PTSD, fibromyalgia who presents in person to [facility] for medication management.    Plan: # Major Depressive Disorder Past medication trials: Prozac  (memory difficulties, ineffective), mirtazapine  (weight gain) Status of problem: not improving Interventions: -- Continue duloxetine  120 mg daily for depression and anxiety -- Start depakote ER 250 mg at bedtime  --CMP 12/10/23 reviewed showing hepatic function WNL  -- CBC 01/11/24 shows mild anemia but otherwise wnl -- Continue therapy with Carmel Chimes  # Generalized Anxiety Disorder Past medication trials:  Status of problem: stable Interventions: -- Duloxetine  as above   # PTSD Past medication trials:  Status of problem: stable Interventions: -- Duloxetine  as above  Patient was given contact information for behavioral health clinic and was instructed to call 911 for emergencies.   Subjective:  Chief Complaint:  Medication adjustment  Interval History:  Patient reports overall  doing okay but has been struggling with significant pain which has worsened her mood recently.  She reports that she also has been experiencing more neuropathy which she will be following up with PCP and pain medicine doctor.  She denies SI/HI/AVH.  She reports eating fairly well.  She reports sleep has been affected recently due to pain.  She would like to trial adjunct treatment to aid with her ongoing symptoms of depression.   Visit Diagnosis:    ICD-10-CM   1. Moderate episode of recurrent major depressive disorder (HCC)  F33.1 divalproex (DEPAKOTE ER) 250 MG 24 hr tablet    Valproic Acid level          Past Medical History:  Past Medical History:  Diagnosis Date   Anxiety    Arthritis    Asthma    Coronary artery disease    nonobstructive by 2010 cath   Cough    Depression    Diabetes mellitus    GERD (gastroesophageal reflux disease)    years ago   H/O diverticulitis of colon    Heart attack (HCC) 08/2009   Neuropathy    OSA (obstructive sleep apnea)    pt denies    Pneumonia    Sciatica    Urticaria     Past Surgical History:  Procedure Laterality Date   COLONOSCOPY     NASAL RECONSTRUCTION     ORIF PATELLA Left 10/09/2017   Procedure: PATELLA TENDON REPAIR;  Surgeon: Janeth Medicus, MD;  Location: [facility] OR;  Service: Orthopedics;  Laterality: Left;  120 MINS   PARTIAL COLECTOMY     PATELLAR TENDON REPAIR Left 10/09/2017   TONSILLECTOMY AND ADENOIDECTOMY     UVULOPALATOPHARYNGOPLASTY  Family History:  Family History  Problem Relation Age of Onset   Allergic rhinitis Mother    Diabetes Mother        type 2   Diabetes Brother    Congestive Heart Failure Brother    Emphysema Paternal Grandfather    Tuberculosis Paternal Grandfather    Diabetes Other        cousin type 1    Social History:  Social History   Socioeconomic History   Marital status: Legally Separated    Spouse name: Not on file   Number of children: Not on file   Years of  education: Not on file   Highest education level: Not on file  Occupational History   Not on file  Tobacco Use   Smoking status: Former    Current packs/day: 0.00    Types: Cigarettes    Start date: 10/28/1983    Quit date: 10/28/1987    Years since quitting: 36.3    Passive exposure: Past   Smokeless tobacco: Never   Tobacco comments:    1 pack per 2 months  Vaping Use   Vaping status: Never Used  Substance and Sexual Activity   Alcohol use: No    Alcohol/week: 0.0 standard drinks of alcohol   Drug use: No   Sexual activity: Not Currently    Birth control/protection: None  Other Topics Concern   Not on file  Social History Narrative   Originally from [place]. Always lived in [place]. [place] travel [place]. 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 She has no pets currently. No bird or hot tub exposure. She reports that there has been mold in her basement before that was treated twice. She also has mold in her bathroom & bedroom.    Social Drivers of Health   Financial Resource Strain: Medium Risk (06/16/2022)   Overall Financial Resource Strain (CARDIA)    Difficulty of Paying Living Expenses: Somewhat hard  Food Insecurity: High Risk (12/05/2023)   Received from [facility]   Hunger Vital Sign    Worried About Running Out of Food in the Last Year: Often true    Ran Out of Food in the Last Year: Often true  Transportation Needs: No Transportation Needs (12/05/2023)   Received from [facility]    In the past 12 months, has lack of reliable transportation kept you from medical appointments, meetings, work or from getting things needed for daily living? : No  Physical Activity: Inactive (06/16/2022)   Exercise Vital Sign    Days of Exercise per Week: 0 days    Minutes of Exercise per Session: 0 min  Stress: Stress Concern Present (06/16/2022)   [employer] of Occupational Health -  Occupational Stress Questionnaire    Feeling of Stress : Very much  Social Connections: Moderately Isolated (06/16/2022)   Social Connection and Isolation Panel [NHANES]    Frequency of Communication with Friends and Family: Twice a week    Frequency of Social Gatherings with Friends and Family: Twice a week    Attends Religious Services: More than 4 times per year    Active Member of [employer] or Organizations: No    Attends Banker Meetings: Never    Marital Status: Separated    Allergies: No Known Allergies  Current Medications: Current Outpatient Medications  Medication Sig Dispense Refill   divalproex (DEPAKOTE ER) 250 MG 24  hr tablet Take 1 tablet (250 mg total) by mouth daily. 90 tablet 0   albuterol  (VENTOLIN  HFA) 108 (90 Base) MCG/ACT inhaler Inhale 2 puffs into the lungs every 4 (four) hours as needed for wheezing or shortness of breath.     aspirin  81 MG chewable tablet Chew 81 mg by mouth daily.     Azelastine HCl 137 MCG/SPRAY SOLN Place into the nose.     cyclobenzaprine  (FLEXERIL ) 10 MG tablet Take 10 mg by mouth daily as needed for muscle spasms.     DULoxetine  (CYMBALTA ) 60 MG capsule Take 2 capsules (120 mg total) by mouth daily. 180 capsule 0   fexofenadine (ALLEGRA) 180 MG tablet Take by mouth.     furosemide  (LASIX ) 20 MG tablet Take 20 mg by mouth daily.     gabapentin  (NEURONTIN ) 100 MG capsule Take 1 capsule (100 mg total) by mouth 2 (two) times daily. 180 capsule 0   LANTUS  100 UNIT/ML injection 27 Units 2 (two) times daily.     Menthol, Topical Analgesic, (BIOFREEZE) 4 % GEL Apply 1 application  topically 2 (two) times daily as needed (pain in lower legs/hands). 5%     metFORMIN (GLUCOPHAGE) 500 MG tablet Take 500 mg by mouth 2 (two) times daily.     MOUNJARO 12.5 MG/0.5ML Pen Inject 12.5 mg into the skin once a week.     No current facility-administered medications for this visit.     Objective:  Psychiatric Specialty Exam: There were no vitals  taken for this visit.There is no height or weight on file to calculate BMI.  General Appearance: Well Groomed  Eye Contact:  Good  Speech:  Clear and Coherent and Normal Rate  Volume:  Normal  Mood:  Depressed  Affect:  Depressed and Tearful  Thought Process:  Coherent, Goal Directed, and Linear  Orientation:  Full (Time, Place, and Person)  Thought Content: Logical   Suicidal Thoughts:  No  Homicidal Thoughts:  No  Memory:  Remote;   Good  Judgment:  Good  Insight:  Good  Psychomotor Activity:  Normal  Concentration:  Concentration: Good and Attention Span: Good              Assets:  Communication Skills Desire for Improvement Financial Resources/Insurance Housing Intimacy Leisure Time Physical Health Resilience Social Support Talents/Skills Transportation Vocational/Educational  ADL's:  Intact  Cognition: WNL  Sleep:  Good   PE: General: well-appearing; no acute distress  Pulm: no increased work of breathing on room air  Strength & Muscle Tone: within normal limits Neuro: no focal neurological deficits observed  Gait & Station: normal  Metabolic Disorder Labs: Lab Results  Component Value Date   HGBA1C 10.3 (H) 11/15/2016   MPG 249 11/15/2016   MPG 243 11/14/2016   No results found for: "PROLACTIN" Lab Results  Component Value Date   CHOL  08/31/2009    189        ATP III CLASSIFICATION:  <200     mg/dL   Desirable  147-829  mg/dL   Borderline High  >=562    mg/dL   High          TRIG 59 08/31/2009   HDL 59 08/31/2009   CHOLHDL 3.2 08/31/2009   VLDL 12 08/31/2009   LDLCALC (H) 08/31/2009    118        Total Cholesterol/HDL:CHD Risk Coronary Heart Disease Risk Table  Men   Women  1/2 Average Risk   3.4   3.3  Average Risk       5.0   4.4  2 X Average Risk   9.6   7.1  3 X Average Risk  23.4   11.0        Use the calculated Patient Ratio above and the CHD Risk Table to determine the patient's CHD Risk.        ATP III  CLASSIFICATION (LDL):  <100     mg/dL   Optimal  161-096  mg/dL   Near or Above                    Optimal  130-159  mg/dL   Borderline  045-409  mg/dL   High  >811     mg/dL   Very High   LDLCALC 914 (H) 06/26/2009     Therapeutic Level Labs: No results found for: "LITHIUM" No results found for: "VALPROATE" No results found for: "CBMZ"  Screenings: GAD-7    Flowsheet Row Office Visit from 07/23/2023 in [facility] for [employer] [facility] Counselor from 02/19/2023 in [facility] Counselor from 06/16/2022 in [facility]  Total GAD-7 Score 6 13 20       PHQ2-9    Flowsheet Row Office Visit from 07/23/2023 in Center for [facility] Counselor from 07/02/2023 in [facility] Counselor from 02/19/2023 in [facility] Counselor from 06/16/2022 [facility]  PHQ-2 Total Score 5 6 3 6 4   PHQ-9 Total Score 14 24 14 16 15       Flowsheet Row ED from 10/04/2023 in [facility] Emergency Department at [facility] Counselor from 06/16/2022 in [facility]  C-SSRS RISK CATEGORY No Risk No Risk       Collaboration of Care: Collaboration of Care:   Patient/Guardian was advised Release of Information must be obtained prior to any record release in order to collaborate their care with an outside provider. Patient/Guardian was advised if they have not already done so to contact the registration department to sign all necessary forms in order for us  to release information regarding their care.   Consent: Patient/Guardian gives verbal consent for treatment and assignment of benefits for services provided during this visit. Patient/Guardian expressed understanding and agreed to proceed.   A total  of 35 minutes was spent involved in face to face clinical care, chart review, and documentation.   Augusta Blizzard, MD 03/10/2024, 2:37 PM

## 2024-03-11 DIAGNOSIS — Z79899 Other long term (current) drug therapy: Secondary | ICD-10-CM | POA: Insufficient documentation

## 2024-03-11 NOTE — Addendum Note (Signed)
 Addended by: Eller Gut on: 03/11/2024 10:52 AM   Modules accepted: Orders

## 2024-04-06 ENCOUNTER — Other Ambulatory Visit (INDEPENDENT_AMBULATORY_CARE_PROVIDER_SITE_OTHER): Payer: MEDICAID

## 2024-04-06 DIAGNOSIS — F322 Major depressive disorder, single episode, severe without psychotic features: Secondary | ICD-10-CM | POA: Diagnosis not present

## 2024-04-06 DIAGNOSIS — Z79899 Other long term (current) drug therapy: Secondary | ICD-10-CM | POA: Diagnosis not present

## 2024-04-06 NOTE — Progress Notes (Signed)
 Pt tolerated labs well in left arm with no complaints.    JNL, CMA

## 2024-04-07 LAB — COMPREHENSIVE METABOLIC PANEL WITH GFR
ALT: 16 IU/L (ref 0–32)
AST: 21 IU/L (ref 0–40)
Albumin: 4 g/dL (ref 3.9–4.9)
Alkaline Phosphatase: 99 IU/L (ref 44–121)
BUN/Creatinine Ratio: 8 — ABNORMAL LOW (ref 12–28)
BUN: 7 mg/dL — ABNORMAL LOW (ref 8–27)
Bilirubin Total: 0.2 mg/dL (ref 0.0–1.2)
CO2: 25 mmol/L (ref 20–29)
Calcium: 9.3 mg/dL (ref 8.7–10.3)
Chloride: 102 mmol/L (ref 96–106)
Creatinine, Ser: 0.89 mg/dL (ref 0.57–1.00)
Globulin, Total: 2.4 g/dL (ref 1.5–4.5)
Glucose: 149 mg/dL — ABNORMAL HIGH (ref 70–99)
Potassium: 5 mmol/L (ref 3.5–5.2)
Sodium: 145 mmol/L — ABNORMAL HIGH (ref 134–144)
Total Protein: 6.4 g/dL (ref 6.0–8.5)
eGFR: 72 mL/min/{1.73_m2} (ref >59)

## 2024-04-07 LAB — CBC WITH DIFFERENTIAL/PLATELET
Basophils Absolute: 0 10*3/uL (ref 0.0–0.2)
Basos: 1 %
EOS (ABSOLUTE): 0.1 10*3/uL (ref 0.0–0.4)
Eos: 2 %
Hematocrit: 38.3 % (ref 34.0–46.6)
Hemoglobin: 12.3 g/dL (ref 11.1–15.9)
Immature Grans (Abs): 0 10*3/uL (ref 0.0–0.1)
Immature Granulocytes: 0 %
Lymphocytes Absolute: 1.2 10*3/uL (ref 0.7–3.1)
Lymphs: 21 %
MCH: 30.2 pg (ref 26.6–33.0)
MCHC: 32.1 g/dL (ref 31.5–35.7)
MCV: 94 fL (ref 79–97)
Monocytes Absolute: 0.6 10*3/uL (ref 0.1–0.9)
Monocytes: 10 %
Neutrophils Absolute: 3.8 10*3/uL (ref 1.4–7.0)
Neutrophils: 66 %
Platelets: 239 10*3/uL (ref 150–450)
RBC: 4.07 x10E6/uL (ref 3.77–5.28)
RDW: 12.6 % (ref 11.7–15.4)
WBC: 5.8 10*3/uL (ref 3.4–10.8)

## 2024-04-07 LAB — VALPROIC ACID LEVEL: Valproic Acid Lvl: 12 ug/mL — ABNORMAL LOW (ref 50–100)

## 2024-05-04 ENCOUNTER — Ambulatory Visit (INDEPENDENT_AMBULATORY_CARE_PROVIDER_SITE_OTHER): Payer: MEDICAID | Admitting: Mental Health

## 2024-05-04 DIAGNOSIS — F331 Major depressive disorder, recurrent, moderate: Secondary | ICD-10-CM

## 2024-05-04 DIAGNOSIS — F4310 Post-traumatic stress disorder, unspecified: Secondary | ICD-10-CM

## 2024-05-04 NOTE — Progress Notes (Signed)
   THERAPIST PROGRESS NOTE Virtual Visit via Video Note  I connected with Veronica [name] on 05/04/24 at  2:00 PM EDT by a video enabled telemedicine application and verified that I am speaking with the correct person using two identifiers.  Location: Patient: [employer] [place] - parking lot  Provider: office   I discussed the limitations of evaluation and management by telemedicine and the availability of in person appointments. The patient expressed understanding and agreed to proceed.  I discussed the assessment and treatment plan with the patient. The patient was provided an opportunity to ask questions and all were answered. The patient agreed with the plan and demonstrated an understanding of the instructions.   The patient was advised to call back or seek an in-person evaluation if the symptoms worsen or if the condition fails to improve as anticipated.  I provided 33 minutes of non-face-to-face time during this encounter.   Ty Bernice [place], [facility]   Session Time: 2:05pm  Participation Level: Active  Behavioral Response: Neat and Well GroomedAlertEuthymic  Type of Therapy: Individual Therapy  Treatment Goals addressed: STG: Veronica [name] will decrease sxs of depression AEB development of x 5 effective coping skills with ability to reframe maladaptive thinking patterns 7/7 days within the next 6 months   ProgressTowards Goals: Progressing  Interventions: Supportive  Summary: Veronica [name] is a 63 y.o. female who presents with hx of dx of depression and generalized anxiety, PTSD. Veronica [name] presents alert and oriented; mood and affect euthymic. Speech clear and coherent at normal rate and tone. Shares to currently be sitting in car with husband and reports consent for his presence. Shares for moods to currently be stable but shares to have had a meltdown last month. Notes uncontrollable crying. Notes thoughts of cousin to have passed and could have been related. Shares stressor  of car to have been repossessed but shares circumstances in which she was able to obtain car back through help of a son. Notes housing to continue to be stable but is in need of letting go of items in the home. Shares thoughts on her faith to be a support as well as her husband who keeps her encouraged. Shares for sxs to be stable at this time.    Suicidal/Homicidal: Nowithout intent/plan  Therapist Response:  Therapist engaged Veronica [name] in therapy session. Assessed for current level of functioning, sxs management and level of stressors. Provided support and encouragement validated feelings and provided supportive feedback. Supported in processing recent events providing support and encouragement. Explored ability to navigate and cope with recent stressors. Encouraged working to engage in ongoing cognitive coping and accessing supports in community as needed. Reviewed session and provided follow up.   Plan: Return again in x 7 weeks.  Diagnosis: Moderate episode of recurrent major depressive disorder (HCC)  PTSD (post-traumatic stress disorder)  Collaboration of Care: Other None  Patient/Guardian was advised Release of Information must be obtained prior to any record release in order to collaborate their care with an outside provider. Patient/Guardian was advised if they have not already done so to contact the registration department to sign all necessary forms in order for us  to release information regarding their care.   Consent: Patient/Guardian gives verbal consent for treatment and assignment of benefits for services provided during this visit. Patient/Guardian expressed understanding and agreed to proceed.   Ty Bernice [place], [facility] 05/04/2024

## 2024-05-25 ENCOUNTER — Ambulatory Visit (HOSPITAL_COMMUNITY): Payer: MEDICAID | Admitting: Mental Health

## 2024-06-09 ENCOUNTER — Telehealth (HOSPITAL_COMMUNITY): Payer: Self-pay | Admitting: Student

## 2024-06-09 NOTE — Telephone Encounter (Signed)
 Patient came into the office and wanted to speak with Dr. Lynnette. She needs a letter stating that she needs an emotional support dog. The letter is needed for her landlord.  The best number to contact her is [phone]

## 2024-06-12 NOTE — Telephone Encounter (Signed)
 Called and left letter with front desk for her to pick up.

## 2024-07-05 ENCOUNTER — Encounter (HOSPITAL_COMMUNITY): Payer: Self-pay

## 2024-07-05 ENCOUNTER — Ambulatory Visit (HOSPITAL_COMMUNITY): Payer: MEDICAID | Admitting: Mental Health

## 2024-07-14 ENCOUNTER — Ambulatory Visit (HOSPITAL_COMMUNITY): Payer: MEDICAID | Admitting: Mental Health

## 2024-07-14 DIAGNOSIS — F331 Major depressive disorder, recurrent, moderate: Secondary | ICD-10-CM

## 2024-07-14 DIAGNOSIS — F411 Generalized anxiety disorder: Secondary | ICD-10-CM

## 2024-07-14 NOTE — Progress Notes (Signed)
   THERAPIST PROGRESS NOTE Virtual Visit via Video Note  I connected with Veronica [name] on 07/14/24 at  2:00 PM EDT by a video enabled telemedicine application and verified that I am speaking with the correct person using two identifiers.  Location: Patient: home - [place] Provider: office   I discussed the limitations of evaluation and management by telemedicine and the availability of in person appointments. The patient expressed understanding and agreed to proceed.  I discussed the assessment and treatment plan with the patient. The patient was provided an opportunity to ask questions and all were answered. The patient agreed with the plan and demonstrated an understanding of the instructions.   The patient was advised to call back or seek an in-person evaluation if the symptoms worsen or if the condition fails to improve as anticipated.  I provided 37 minutes of non-face-to-face time during this encounter.   Ty Asal [place], [facility]   Session Time: 02: 12 pm   Participation Level: Active  Behavioral Response: Fairly GroomedAlertDysphoric  Type of Therapy: Individual Therapy  Treatment Goals addressed:  STG: Dare will decrease sxs of depression AEB development of x 5 effective coping skills with ability to reframe maladaptive thinking patterns 7/7 days within the next 6 months   ProgressTowards Goals: Progressing  Interventions: Strength-based and Supportive  Summary: Veronica [name] is a 63 y.o. female who presents with hx of dx of depression and generalized anxiety, PTSD. Veronica [name] presents alert and oriented; mood and affect low. Speech clear and coherent at normal rate and tone. Shares with therapist recent events with husband and mother to have been in the hospital as well her managing her own medical concerns. Shares increased anxiety and working to cope with self-care and and deceasing additional activities and focusing on family. Ongoing work towards goals.  Denies safety concerns.   Suicidal/Homicidal: Nowithout intent/plan  Therapist Response:   Therapist engaged [place] in therapy session. Assessed for current level of functioning, sxs management and level of stressors. Provided support and encouragement validated feelings and provided supportive feedback. Supported in processing recent events providing support and encouragement. Encouraged engaging in relaxation skills and focusing on family. Reviewed session and provided follow up.   Plan: Return again in  x 4 weeks.  Diagnosis: Generalized anxiety disorder  Moderate episode of recurrent major depressive disorder (HCC)  Collaboration of Care: Other none  Patient/Guardian was advised Release of Information must be obtained prior to any record release in order to collaborate their care with an outside provider. Patient/Guardian was advised if they have not already done so to contact the registration department to sign all necessary forms in order for us  to release information regarding their care.   Consent: Patient/Guardian gives verbal consent for treatment and assignment of benefits for services provided during this visit. Patient/Guardian expressed understanding and agreed to proceed.   Ty Asal [place], [facility] 07/14/2024

## 2024-08-25 ENCOUNTER — Telehealth (HOSPITAL_COMMUNITY): Payer: Self-pay | Admitting: Mental Health

## 2024-08-25 NOTE — Telephone Encounter (Signed)
 Patient is asking if you have any cancellations to please consider her. She states that she is having a really hard time. Thanks!

## 2024-09-30 ENCOUNTER — Telehealth (HOSPITAL_COMMUNITY): Payer: MEDICAID | Admitting: Student

## 2024-09-30 ENCOUNTER — Encounter (HOSPITAL_COMMUNITY): Payer: Self-pay

## 2024-10-12 ENCOUNTER — Ambulatory Visit (INDEPENDENT_AMBULATORY_CARE_PROVIDER_SITE_OTHER): Payer: MEDICAID | Admitting: Mental Health

## 2024-10-12 ENCOUNTER — Encounter (HOSPITAL_COMMUNITY): Payer: Self-pay

## 2024-10-12 DIAGNOSIS — F331 Major depressive disorder, recurrent, moderate: Secondary | ICD-10-CM | POA: Diagnosis not present

## 2024-10-12 DIAGNOSIS — F411 Generalized anxiety disorder: Secondary | ICD-10-CM

## 2024-10-12 NOTE — Progress Notes (Signed)
° °  THERAPIST PROGRESS NOTE  Session Time: 8:11am ( 50 minutes)  Participation Level: Active  Behavioral Response: Casual, Neat, and Well GroomedAlertDysphoric  Type of Therapy: Individual Therapy  Treatment Goals addressed: STG: Hannahgrace will maintain stability in moods AEB ability to engage in self-care weekly with ability to set appropriate boundaries with self and others as needed within the next 90 days   ProgressTowards Goals: Progressing  Interventions: Strength-based and Supportive  Summary:Gatha A Fess is a 63 y.o. female who presents with hx of dx of depression and generalized anxiety, PTSD. Aundra presents alert and oriented; mood and affect adequate. Speech clear and coherent at normal rate and tone. Shares for past months to have been stressful and shares with therapist series of events of family being sick, passing and friends and financial stressors. Shares thoughts on concern for husband with medical concerns as well as concerns for his emotional wellbeing. Shares working to not spend increased time in bed with previously means of coping with isolation, and working to support mother and interact with others. Notes ongoing work needed to monitor and maintan depression sxs with ongoing stressors. Denies safety concerns.   Suicidal/Homicidal: Nowithout intent/plan  Therapist Response: Therapist engaged [place] in therapy session. Assessed for current level of functioning, sxs management and level of stressors. Provided support and encouragement validated feelings and provided supportive feedback. Supported in processing recent events providing support and encouragement. Active empathic listening with providing support as needed. Explored ability engage in balance thinking and setting boundaries with self in care taking capacity of others. Explores engagement in self-care. Encouraged seeking support as needed. Reviewed session and provided follow up.   Plan: Return again in  x 6  weeks.  Diagnosis: Moderate episode of recurrent major depressive disorder (HCC)  Generalized anxiety disorder  Collaboration of Care: Other None  Patient/Guardian was advised Release of Information must be obtained prior to any record release in order to collaborate their care with an outside provider. Patient/Guardian was advised if they have not already done so to contact the registration department to sign all necessary forms in order for us  to release information regarding their care.   Consent: Patient/Guardian gives verbal consent for treatment and assignment of benefits for services provided during this visit. Patient/Guardian expressed understanding and agreed to proceed.   Ty Asal [place], [facility] 10/12/2024

## 2024-11-02 ENCOUNTER — Ambulatory Visit (INDEPENDENT_AMBULATORY_CARE_PROVIDER_SITE_OTHER): Payer: MEDICAID | Admitting: Mental Health

## 2024-11-02 DIAGNOSIS — F411 Generalized anxiety disorder: Secondary | ICD-10-CM

## 2024-11-02 DIAGNOSIS — F4310 Post-traumatic stress disorder, unspecified: Secondary | ICD-10-CM

## 2024-11-02 DIAGNOSIS — F331 Major depressive disorder, recurrent, moderate: Secondary | ICD-10-CM | POA: Diagnosis not present

## 2024-11-02 NOTE — Progress Notes (Signed)
" ° °  THERAPIST PROGRESS NOTE  Session Time: 9:14 am ( 40 minutes)  Participation Level: Active  Behavioral Response: Casual, Neat, and Well GroomedAlertEuthymic  Type of Therapy: Individual Therapy  Treatment Goals addressed: STG: Gwynn will maintain stability in moods AEB ability to engage in self-care weekly with ability to set appropriate boundaries with self and others as needed within the next 90 days   ProgressTowards Goals: Progressing  Interventions: Strength-based and Supportive  Summary: Veronica [name] is a 64 y.o. female who presents with hx of dx of depression and generalized anxiety, PTSD. [name] presents alert and oriented; mood and affect stable, euthymic. Speech clear and coherent at normal rate and tone. Engaged and receptive to interventions. Shares for moods to have improved secondary to stressors. Notes  I am in a happy place. Peace. Shares has been being mindful of her self-care and setting boundaries as needed to protect her feelings of peace. Shares recent stressor secondary to incident with husband and shares thoughts and concerns but reports for this to be well well and stable at this time. 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 Shares for mood and sxs to be stable and this time and denies concerns. Ongoing work towards goals. Denies safety concerns.   Will complete CCA at next visit.  Suicidal/Homicidal: Nowithout intent/plan  Therapist Response: Therapist engaged [place] in therapy session. Assessed for current level of functioning, sxs management and level of stressors. Provided support and encouragement validated feelings and provided supportive feedback. Explored factors supporting in stability in sxs, coping skills and strengths present. Supported in reviewing recent events with husband and ability to navigate and  manage. Explored presence of balance in life as well as quality of of life. Reviewed session and provided follow up.   Plan: Return again in  x 5 weeks.  Diagnosis: Moderate episode of recurrent major depressive disorder (HCC)  Generalized anxiety disorder  PTSD (post-traumatic stress disorder)  Collaboration of Care: Other None  Patient/Guardian was advised Release of Information must be obtained prior to any record release in order to collaborate their care with an outside provider. Patient/Guardian was advised if they have not already done so to contact the registration department to sign all necessary forms in order for us  to release information regarding their care.   Consent: Patient/Guardian gives verbal consent for treatment and assignment of benefits for services provided during this visit. Patient/Guardian expressed understanding and agreed to proceed.   Ty Asal [place], [facility] 11/02/2024  "

## 2024-11-25 ENCOUNTER — Encounter (HOSPITAL_COMMUNITY): Payer: MEDICAID | Admitting: Student

## 2024-12-02 ENCOUNTER — Ambulatory Visit (HOSPITAL_COMMUNITY): Payer: MEDICAID | Admitting: Student

## 2024-12-02 DIAGNOSIS — F411 Generalized anxiety disorder: Secondary | ICD-10-CM

## 2024-12-02 DIAGNOSIS — F331 Major depressive disorder, recurrent, moderate: Secondary | ICD-10-CM

## 2024-12-02 NOTE — Progress Notes (Cosign Needed)
 BH MD Outpatient Progress Note  12/02/2024 1:11 PM Veronica [name]  MRN:  [id number]  Assessment:  Veronica [name] presents for follow-up evaluation in-person. Today, 12/02/24, patient reports multiple interpersonal stressors but has been managing them with therapy and ongoing psychotropic of duloxetine . She has self-discontinued Depakote  due to lack of efficacy. Plan to continue psychotropics as prescribed and patient will follow-up in 2 to 3 months for follow-up.   Identifying Information: Veronica [name] is a 64 y.o. y.o. female with a history of MDD, GAD, PTSD, fibromyalgia who presents in person to [facility] for medication management.    Plan: # Major Depressive Disorder Past medication trials: Prozac  (memory difficulties, ineffective), mirtazapine  (weight gain), valproic acid  (ineffective) Status of problem: not improving Interventions: -- Continue duloxetine  120 mg daily for depression and anxiety -- Continue therapy with Bernice Rao  # Generalized Anxiety Disorder Past medication trials:  Status of problem: stable Interventions: -- Duloxetine  as above   # PTSD Past medication trials:  Status of problem: stable Interventions: -- Duloxetine  as above  Patient was given contact information for behavioral health clinic and was instructed to call 911 for emergencies.   Subjective:  Chief Complaint:  Medication adjustment  Interval History:  Patient reports overall doing okay but has been struggling with significant pain due to weather as well as interpersonal stressors.  She is learning to set boundaries and has been coping with husband and mother recently being hospitalized.She denies SI/HI/AVH.  She reports eating fairly well.  She reports sleep has been affected recently due to pain and anxiety.   Visit Diagnosis:    ICD-10-CM   1. Generalized anxiety disorder  F41.1     2. Moderate episode of recurrent major depressive disorder (HCC)   F33.1            Past Medical History:  Past Medical History:  Diagnosis Date   Anxiety    Arthritis    Asthma    Coronary artery disease    nonobstructive by 2010 cath   Cough    Depression    Diabetes mellitus    GERD (gastroesophageal reflux disease)    years ago   H/O diverticulitis of colon    Heart attack (HCC) 08/2009   Neuropathy    OSA (obstructive sleep apnea)    pt denies    Pneumonia    Sciatica    Urticaria     Past Surgical History:  Procedure Laterality Date   COLONOSCOPY     NASAL RECONSTRUCTION     ORIF PATELLA Left 10/09/2017   Procedure: PATELLA TENDON REPAIR;  Surgeon: Sharl Selinda Dover, MD;  Location: [facility] OR;  Service: Orthopedics;  Laterality: Left;  120 MINS   PARTIAL COLECTOMY     PATELLAR TENDON REPAIR Left 10/09/2017   TONSILLECTOMY AND ADENOIDECTOMY     UVULOPALATOPHARYNGOPLASTY       Family History:  Family History  Problem Relation Age of Onset   Allergic rhinitis Mother    Diabetes Mother        type 2   Diabetes Brother    Congestive Heart Failure Brother    Emphysema Paternal Grandfather    Tuberculosis Paternal Grandfather    Diabetes Other        cousin type 1    Social History:  Social History   Socioeconomic History   Marital status: Legally Separated    Spouse name: Not on file   Number of children: Not on file  Years of education: Not on file   Highest education level: Not on file  Occupational History   Not on file  Tobacco Use   Smoking status: Former    Current packs/day: 0.00    Types: Cigarettes    Start date: 10/28/1983    Quit date: 10/28/1987    Years since quitting: 37.1    Passive exposure: Past   Smokeless tobacco: Never   Tobacco comments:    1 pack per 2 months  Vaping Use   Vaping status: Never Used  Substance and Sexual Activity   Alcohol use: No    Alcohol/week: 0.0 standard drinks of alcohol   Drug use: No   Sexual activity: Not Currently    Birth control/protection: None   Other Topics Concern   Not on file  Social History Narrative   Originally from [place]. Always lived in [place]. Prior travel to [place], [place]. 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 She has no pets currently. No bird or hot tub exposure. She reports that there has been mold in her basement before that was treated twice. She also has mold in her bathroom & bedroom.    Social Drivers of Health   Tobacco Use: Low Risk (10/26/2024)   Received from [facility]   Patient History    Smoking Tobacco Use: Never    Smokeless Tobacco Use: Never    Passive Exposure: Not on file  Financial Resource Strain: Medium Risk (06/16/2022)   Overall Financial Resource Strain (CARDIA)    Difficulty of Paying Living Expenses: Somewhat hard  Food Insecurity: High Risk (12/05/2023)   Received from [facility]   Epic    Within the past 12 months, you worried that your food would run out before you got money to buy more: Often true    Within the past 12 months, the food you bought just didn't last and you didn't have money to get more. : Often true  Transportation Needs: No Transportation Needs (12/05/2023)   Received from [facility]    In the past 12 months, has lack of reliable transportation kept you from medical appointments, meetings, work or from getting things needed for daily living? : No  Physical Activity: Inactive (06/16/2022)   Exercise Vital Sign    Days of Exercise per Week: 0 days    Minutes of Exercise per Session: 0 min  Stress: Stress Concern Present (06/16/2022)   [employer] of Occupational Health - Occupational Stress Questionnaire    Feeling of Stress : Very much  Social Connections: Moderately Isolated (06/16/2022)   Social Connection and Isolation Panel    Frequency of Communication with Friends and Family: Twice a week    Frequency of Social Gatherings with Friends and Family: Twice a week     Attends Religious Services: More than 4 times per year    Active Member of Clubs or Organizations: No    Attends Banker Meetings: Never    Marital Status: Separated  Depression (PHQ2-9): High Risk (07/23/2023)   Depression (PHQ2-9)    PHQ-2 Score: 14  Alcohol Screen: Low Risk (06/16/2022)   Alcohol Screen    Last Alcohol Screening Score (AUDIT): 0  Housing: High Risk (12/05/2023)   Received from [facility]   Epic    What is your living situation today?: I do not have a steady place to live (temporarily staying with others, in  a hotel, in a shelter, ...    Think about the place you live. Do you have problems with any of the following? Choose all that apply:: Lead paint or pipes;Mold;Pests such as bugs, ants, or mice  Utilities: Low Risk (12/05/2023)   Received from [facility]   Utilities    In the past 12 months has the electric, gas, oil, or water company threatened to shut off services in your home? : No  Health Literacy: Adequate Health Literacy (07/02/2023)   B1300 Health Literacy    Frequency of need for help with medical instructions: Never    Allergies: No Known Allergies  Current Medications: Current Outpatient Medications  Medication Sig Dispense Refill   albuterol  (VENTOLIN  HFA) 108 (90 Base) MCG/ACT inhaler Inhale 2 puffs into the lungs every 4 (four) hours as needed for wheezing or shortness of breath.     aspirin  81 MG chewable tablet Chew 81 mg by mouth daily.     Azelastine HCl 137 MCG/SPRAY SOLN Place into the nose.     cyclobenzaprine  (FLEXERIL ) 10 MG tablet Take 10 mg by mouth daily as needed for muscle spasms.     divalproex  (DEPAKOTE  ER) 250 MG 24 hr tablet Take 1 tablet (250 mg total) by mouth daily. 90 tablet 0   DULoxetine  (CYMBALTA ) 60 MG capsule Take 2 capsules (120 mg total) by mouth daily. 180 capsule 0   fexofenadine (ALLEGRA) 180 MG tablet Take by mouth.     furosemide  (LASIX ) 20 MG tablet Take 20 mg by mouth daily.     gabapentin   (NEURONTIN ) 100 MG capsule Take 1 capsule (100 mg total) by mouth 2 (two) times daily. 180 capsule 0   LANTUS  100 UNIT/ML injection 27 Units 2 (two) times daily.     Menthol, Topical Analgesic, (BIOFREEZE) 4 % GEL Apply 1 application  topically 2 (two) times daily as needed (pain in lower legs/hands). 5%     metFORMIN (GLUCOPHAGE) 500 MG tablet Take 500 mg by mouth 2 (two) times daily.     MOUNJARO 12.5 MG/0.5ML Pen Inject 12.5 mg into the skin once a week.     No current facility-administered medications for this visit.     Objective:  Psychiatric Specialty Exam: There were no vitals taken for this visit.There is no height or weight on file to calculate BMI.  General Appearance: Well Groomed  Eye Contact:  Good  Speech:  Clear and Coherent and Normal Rate  Volume:  Normal  Mood:  Depressed  Affect:  Depressed and Tearful  Thought Process:  Coherent, Goal Directed, and Linear  Orientation:  Full (Time, Place, and Person)  Thought Content: Logical   Suicidal Thoughts:  No  Homicidal Thoughts:  No  Memory:  Remote;   Good  Judgment:  Good  Insight:  Good  Psychomotor Activity:  Normal  Concentration:  Concentration: Good and Attention Span: Good              Assets:  Communication Skills Desire for Improvement Financial Resources/Insurance Housing Intimacy Leisure Time Physical Health Resilience Social Support Talents/Skills Transportation Vocational/Educational  ADL's:  Intact  Cognition: WNL  Sleep:  Good   PE: General: well-appearing; no acute distress  Pulm: no increased work of breathing on room air  Strength & Muscle Tone: within normal limits Neuro: no focal neurological deficits observed  Gait & Station: normal  Metabolic Disorder Labs: Lab Results  Component Value Date   HGBA1C 10.3 (H) 11/15/2016   MPG 249 11/15/2016  MPG 243 11/14/2016   No results found for: PROLACTIN Lab Results  Component Value Date   CHOL  08/31/2009    189         ATP III CLASSIFICATION:  <200     mg/dL   Desirable  799-760  mg/dL   Borderline High  >=759    mg/dL   High          TRIG 59 08/31/2009   HDL 59 08/31/2009   CHOLHDL 3.2 08/31/2009   VLDL 12 08/31/2009   LDLCALC (H) 08/31/2009    118        Total Cholesterol/HDL:CHD Risk Coronary Heart Disease Risk Table                     Men   Women  1/2 Average Risk   3.4   3.3  Average Risk       5.0   4.4  2 X Average Risk   9.6   7.1  3 X Average Risk  23.4   11.0        Use the calculated Patient Ratio above and the CHD Risk Table to determine the patient's CHD Risk.        ATP III CLASSIFICATION (LDL):  <100     mg/dL   Optimal  899-870  mg/dL   Near or Above                    Optimal  130-159  mg/dL   Borderline  839-810  mg/dL   High  >809     mg/dL   Very High   LDLCALC 881 (H) 06/26/2009     Therapeutic Level Labs: No results found for: LITHIUM Lab Results  Component Value Date   VALPROATE 12 (L) 04/06/2024   No results found for: CBMZ  Screenings: GAD-7    Flowsheet Row Office Visit from 07/23/2023 in [facility] [employer] [facility] Counselor from 02/19/2023 in [facility] Counselor from 06/16/2022 in [facility]  Total GAD-7 Score 6 13 20    PHQ2-9    Flowsheet Row Office Visit from 07/23/2023 in [facility] Counselor from 07/02/2023 in [facility] Counselor from 02/19/2023 in [facility] Counselor from 06/16/2022 in [facility]  PHQ-2 Total Score 5 6 3 6 4   PHQ-9 Total Score 14 24 14 16 15    Flowsheet Row ED from 10/04/2023 in [facility] at [facility] Counselor from 06/16/2022 in [facility]  C-SSRS RISK CATEGORY No  Risk No Risk    Collaboration of Care: Collaboration of Care:   Patient/Guardian was advised Release of Information must be obtained prior to any record release in order to collaborate their care with an outside provider. Patient/Guardian was advised if they have not already done so to contact the registration department to sign all necessary forms in order for us  to release information regarding their care.   Consent: Patient/Guardian gives verbal consent for treatment and assignment of benefits for services provided during this visit. Patient/Guardian expressed understanding and agreed to proceed.   A total of 35 minutes was spent involved in face to face clinical care, chart review, and documentation.   Prentice Espy, MD 12/02/2024, 1:11 PM

## 2024-12-21 ENCOUNTER — Ambulatory Visit (HOSPITAL_COMMUNITY): Payer: MEDICAID | Admitting: Mental Health

## 2025-01-04 ENCOUNTER — Ambulatory Visit (HOSPITAL_COMMUNITY): Payer: MEDICAID | Admitting: Mental Health

## 2025-02-17 ENCOUNTER — Encounter (HOSPITAL_COMMUNITY): Payer: MEDICAID | Admitting: Student
